# Patient Record
Sex: Female | Born: 1990 | Race: Black or African American | Hispanic: No | Marital: Single | Smoking: Former smoker
Health system: Southern US, Community
[De-identification: ages and names within clinical notes are randomized; demographics above are authoritative.]

## PROBLEM LIST (undated history)

## (undated) ENCOUNTER — Inpatient Hospital Stay (HOSPITAL_COMMUNITY): Payer: Self-pay

## (undated) DIAGNOSIS — F32A Depression, unspecified: Secondary | ICD-10-CM

## (undated) DIAGNOSIS — F319 Bipolar disorder, unspecified: Secondary | ICD-10-CM

## (undated) DIAGNOSIS — E282 Polycystic ovarian syndrome: Secondary | ICD-10-CM

## (undated) DIAGNOSIS — A549 Gonococcal infection, unspecified: Secondary | ICD-10-CM

## (undated) DIAGNOSIS — F4310 Post-traumatic stress disorder, unspecified: Secondary | ICD-10-CM

## (undated) DIAGNOSIS — A599 Trichomoniasis, unspecified: Secondary | ICD-10-CM

## (undated) DIAGNOSIS — I10 Essential (primary) hypertension: Secondary | ICD-10-CM

## (undated) DIAGNOSIS — F329 Major depressive disorder, single episode, unspecified: Secondary | ICD-10-CM

## (undated) HISTORY — PX: WISDOM TOOTH EXTRACTION: SHX21

## (undated) HISTORY — PX: EAR EXAMINATION UNDER ANESTHESIA: SHX1482

## (undated) HISTORY — DX: Essential (primary) hypertension: I10

---
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
## 2012-02-13 ENCOUNTER — Encounter (HOSPITAL_COMMUNITY): Payer: Self-pay | Admitting: *Deleted

## 2012-02-13 ENCOUNTER — Emergency Department (HOSPITAL_COMMUNITY)
Admission: EM | Admit: 2012-02-13 | Discharge: 2012-02-13 | Disposition: A | Discharge status: Home Health | Payer: Medicaid Other | Attending: Emergency Medicine | Admitting: Emergency Medicine

## 2012-02-13 DIAGNOSIS — R110 Nausea: Secondary | ICD-10-CM | POA: Insufficient documentation

## 2012-02-13 DIAGNOSIS — R102 Pelvic and perineal pain: Secondary | ICD-10-CM

## 2012-02-13 DIAGNOSIS — Z711 Person with feared health complaint in whom no diagnosis is made: Secondary | ICD-10-CM

## 2012-02-13 DIAGNOSIS — F17200 Nicotine dependence, unspecified, uncomplicated: Secondary | ICD-10-CM | POA: Insufficient documentation

## 2012-02-13 DIAGNOSIS — N949 Unspecified condition associated with female genital organs and menstrual cycle: Secondary | ICD-10-CM | POA: Insufficient documentation

## 2012-02-13 DIAGNOSIS — R350 Frequency of micturition: Secondary | ICD-10-CM | POA: Insufficient documentation

## 2012-02-13 DIAGNOSIS — R630 Anorexia: Secondary | ICD-10-CM | POA: Insufficient documentation

## 2012-02-13 DIAGNOSIS — Z8739 Personal history of other diseases of the musculoskeletal system and connective tissue: Secondary | ICD-10-CM | POA: Insufficient documentation

## 2012-02-13 DIAGNOSIS — R300 Dysuria: Secondary | ICD-10-CM | POA: Insufficient documentation

## 2012-02-13 DIAGNOSIS — G43909 Migraine, unspecified, not intractable, without status migrainosus: Secondary | ICD-10-CM | POA: Insufficient documentation

## 2012-02-13 LAB — URINALYSIS, ROUTINE W REFLEX MICROSCOPIC
Glucose, UA: NEGATIVE mg/dL
Ketones, ur: 15 mg/dL — AB
Leukocytes, UA: NEGATIVE
Protein, ur: NEGATIVE mg/dL
Urobilinogen, UA: 1 mg/dL (ref 0.0–1.0)

## 2012-02-13 LAB — COMPREHENSIVE METABOLIC PANEL
AST: 12 U/L (ref 0–37)
Albumin: 4 g/dL (ref 3.5–5.2)
BUN: 12 mg/dL (ref 6–23)
Creatinine, Ser: 0.9 mg/dL (ref 0.50–1.10)
Total Protein: 7.3 g/dL (ref 6.0–8.3)

## 2012-02-13 LAB — WET PREP, GENITAL
Trich, Wet Prep: NONE SEEN
Yeast Wet Prep HPF POC: NONE SEEN

## 2012-02-13 LAB — PREGNANCY, URINE: Preg Test, Ur: NEGATIVE

## 2012-02-13 LAB — CBC WITH DIFFERENTIAL/PLATELET
Basophils Absolute: 0 10*3/uL (ref 0.0–0.1)
Basophils Relative: 0 % (ref 0–1)
Eosinophils Absolute: 0.2 10*3/uL (ref 0.0–0.7)
HCT: 41.9 % (ref 36.0–46.0)
Hemoglobin: 14 g/dL (ref 12.0–15.0)
MCH: 28.9 pg (ref 26.0–34.0)
MCHC: 33.4 g/dL (ref 30.0–36.0)
Monocytes Absolute: 0.5 10*3/uL (ref 0.1–1.0)
Monocytes Relative: 7 % (ref 3–12)
Neutro Abs: 4.5 10*3/uL (ref 1.7–7.7)
Neutrophils Relative %: 58 % (ref 43–77)
RDW: 13.9 % (ref 11.5–15.5)

## 2012-02-13 LAB — LIPASE, BLOOD: Lipase: 21 U/L (ref 11–59)

## 2012-02-13 MED ORDER — DEXAMETHASONE SODIUM PHOSPHATE 10 MG/ML IJ SOLN
10.0000 mg | Freq: Once | INTRAMUSCULAR | Status: AC
  Administered 2012-02-13: 10 mg via INTRAVENOUS
  Filled 2012-02-13: qty 1

## 2012-02-13 MED ORDER — METOCLOPRAMIDE HCL 5 MG/ML IJ SOLN
10.0000 mg | Freq: Once | INTRAMUSCULAR | Status: AC
  Administered 2012-02-13: 10 mg via INTRAVENOUS
  Filled 2012-02-13: qty 2

## 2012-02-13 MED ORDER — AZITHROMYCIN 250 MG PO TABS
1000.0000 mg | ORAL_TABLET | Freq: Once | ORAL | Status: AC
  Administered 2012-02-13: 1000 mg via ORAL
  Filled 2012-02-13: qty 4

## 2012-02-13 MED ORDER — CEFTRIAXONE SODIUM 250 MG IJ SOLR
250.0000 mg | Freq: Once | INTRAMUSCULAR | Status: AC
  Administered 2012-02-13: 250 mg via INTRAMUSCULAR
  Filled 2012-02-13: qty 250

## 2012-02-13 MED ORDER — DIPHENHYDRAMINE HCL 50 MG/ML IJ SOLN
25.0000 mg | Freq: Once | INTRAMUSCULAR | Status: AC
  Administered 2012-02-13: 25 mg via INTRAVENOUS
  Filled 2012-02-13: qty 1

## 2012-02-13 NOTE — ED Provider Notes (Addendum)
History     CSN: [id number]  Arrival date & time 02/13/12  1525   First MD Initiated Contact with Patient 02/13/12 2033      Chief Complaint  Patient presents with  . multiple complaints     (Consider location/radiation/quality/duration/timing/severity/associated sxs/prior treatment) Patient is a 21 y.o. female presenting with headaches and abdominal pain. The history is provided by the patient.  Headache  This is a recurrent problem. The current episode started 12 to 24 hours ago. The problem occurs constantly. The problem has not changed since onset.The headache is associated with loud noise. The pain is located in the left unilateral region. The quality of the pain is described as throbbing. The pain is at a severity of 7/10. The pain is moderate. The pain does not radiate. Associated symptoms include anorexia and nausea. Pertinent negatives include no fever, no palpitations, no shortness of breath and no vomiting. She has tried acetaminophen for the symptoms. The treatment provided no relief.  Abdominal Pain The primary symptoms of the illness include abdominal pain, nausea and dysuria. The primary symptoms of the illness do not include fever, shortness of breath, vomiting or vaginal discharge. The current episode started yesterday. The onset of the illness was gradual. The problem has been gradually worsening.  The abdominal pain began yesterday. The pain came on gradually. The abdominal pain has been gradually worsening since its onset. The abdominal pain is located in the suprapubic region. The abdominal pain radiates to the back. The severity of the abdominal pain is 4/10. The abdominal pain is relieved by nothing. The abdominal pain is exacerbated by urination.  The dysuria is associated with frequency.  Additional symptoms associated with the illness include anorexia and frequency. Associated medical issues comments: her partner told her today that he had something and needed to be  checked.  she is sexually acitve with one partner and does not use protection.    Past Medical History  Diagnosis Date  . Polycystic ovary disease     Past Surgical History  Procedure Date  . Ear examination under anesthesia     No family history on file.  History  Substance Use Topics  . Smoking status: Current Every Day Smoker  . Smokeless tobacco: Never Used  . Alcohol Use: No    OB History    Grav Para Term Preterm Abortions TAB SAB Ect Mult Living                  Review of Systems  Constitutional: Negative for fever.  Respiratory: Negative for shortness of breath.   Cardiovascular: Negative for palpitations.  Gastrointestinal: Positive for nausea, abdominal pain and anorexia. Negative for vomiting.  Genitourinary: Positive for dysuria and frequency. Negative for vaginal discharge.  Neurological: Positive for headaches.  All other systems reviewed and are negative.    Allergies  Penicillins  Home Medications  No current outpatient prescriptions on file.  BP 123/58  Pulse 72  Temp 98.1 F (36.7 C) (Oral)  Resp 18  SpO2 99%  LMP 02/08/2012  Physical Exam  Nursing note and vitals reviewed. Constitutional: She is oriented to person, place, and time. She appears well-developed and well-nourished. No distress.  HENT:  Head: Normocephalic and atraumatic.  Eyes: EOM are normal. Pupils are equal, round, and reactive to light.  Cardiovascular: Normal rate, regular rhythm, normal heart sounds and intact distal pulses.  Exam reveals no friction rub.   No murmur heard. Pulmonary/Chest: Effort normal and breath sounds normal. She has no  wheezes. She has no rales.  Abdominal: Soft. Bowel sounds are normal. She exhibits no distension. There is tenderness in the suprapubic area. There is no rebound, no guarding and no CVA tenderness.  Genitourinary: Vagina normal. Uterus is tender. Cervix exhibits motion tenderness and discharge. Cervix exhibits no friability. Right  adnexum displays tenderness. Right adnexum displays no mass and no fullness. Left adnexum displays tenderness. Left adnexum displays no mass and no fullness.  Musculoskeletal: Normal range of motion. She exhibits no tenderness.       No edema  Neurological: She is alert and oriented to person, place, and time. She has normal strength. No cranial nerve deficit or sensory deficit. Gait normal.  Skin: Skin is warm and dry. No rash noted.  Psychiatric: She has a normal mood and affect. Her behavior is normal.    ED Course  Procedures (including critical care time)  Labs Reviewed  URINALYSIS, ROUTINE W REFLEX MICROSCOPIC - Abnormal; Notable for the following:    APPearance CLOUDY (*)     Specific Gravity, Urine 1.036 (*)     Bilirubin Urine SMALL (*)     Ketones, ur 15 (*)     All other components within normal limits  WET PREP, GENITAL - Abnormal; Notable for the following:    Clue Cells Wet Prep HPF POC FEW (*)     WBC, Wet Prep HPF POC FEW (*)     All other components within normal limits  PREGNANCY, URINE  CBC WITH DIFFERENTIAL  COMPREHENSIVE METABOLIC PANEL  LIPASE, BLOOD  GC/CHLAMYDIA PROBE AMP   No results found.   1. Migraine   2. Pelvic pain   3. Concern about STD in female without diagnosis       MDM   Pt with typical migraine HA without sx suggestive of SAH(sudden onset, worst of life, or deficits), infection, or cavernous vein thrombosis.  Normal neuro exam and vital signs. Will give HA cocktail and on re-eval.  Feel much better with relieved HA.  Secondly pt is c/o urinary frequency and pelvic pain. On exam she has diffuse pelvic tenderness but no focal findings concerning for tubo-ovarian abscess. She does have discharge or wet prep with few white blood cells. However her partner stated he had something and she needed to be checked.  She was given a dose of Rocephin and azithromycin. GC and Chlamydia cultures were sent.  Patient was discharged  home.        Gwyneth Sprout, MD 02/13/12 1610  Gwyneth Sprout, MD 02/13/12 2243

## 2012-02-13 NOTE — ED Notes (Signed)
The pt has had a headache  Diarrhea  Lower back pain that radiates around to her lower abd and painful urination  And frequency for 2 weeks.  lmp  Nov 30th

## 2012-02-14 LAB — GC/CHLAMYDIA PROBE AMP: CT Probe RNA: POSITIVE — AB

## 2012-02-15 ENCOUNTER — Telehealth (HOSPITAL_COMMUNITY): Payer: Self-pay | Admitting: Emergency Medicine

## 2012-02-15 NOTE — ED Notes (Signed)
+  Chlamydia. Patient treated with Rocephin and Zithromax. Per protocol MD. [facility] faxed. °

## 2012-03-11 NOTE — L&D Delivery Note (Signed)
Delivery Note At 3:55 AM a viable female was delivered via  (Presentation: ROA ), compound presentation with posterior arm.   Placenta status: delivered with cord traction.  Cord pH: sent  Anesthesia:  None Episiotomy: None Lacerations: None Suture Repair: N/A Est. Blood Loss (mL): 100 ml  Mom to postpartum.  Baby to NICU.  JACKSON-MOORE,Marjo Grosvenor A 10/15/2012, 4:07 AM

## 2012-04-18 ENCOUNTER — Emergency Department (HOSPITAL_COMMUNITY)
Admission: EM | Admit: 2012-04-18 | Discharge: 2012-04-18 | Disposition: A | Discharge status: Home / Self Care | Payer: Medicaid Other | Attending: Emergency Medicine | Admitting: Emergency Medicine

## 2012-04-18 ENCOUNTER — Emergency Department (HOSPITAL_COMMUNITY): Payer: Medicaid Other

## 2012-04-18 ENCOUNTER — Encounter (HOSPITAL_COMMUNITY): Payer: Self-pay | Admitting: *Deleted

## 2012-04-18 DIAGNOSIS — Z3490 Encounter for supervision of normal pregnancy, unspecified, unspecified trimester: Secondary | ICD-10-CM

## 2012-04-18 DIAGNOSIS — Z3201 Encounter for pregnancy test, result positive: Secondary | ICD-10-CM | POA: Insufficient documentation

## 2012-04-18 DIAGNOSIS — Z8639 Personal history of other endocrine, nutritional and metabolic disease: Secondary | ICD-10-CM | POA: Insufficient documentation

## 2012-04-18 DIAGNOSIS — A599 Trichomoniasis, unspecified: Secondary | ICD-10-CM

## 2012-04-18 DIAGNOSIS — A5901 Trichomonal vulvovaginitis: Secondary | ICD-10-CM | POA: Insufficient documentation

## 2012-04-18 DIAGNOSIS — Z862 Personal history of diseases of the blood and blood-forming organs and certain disorders involving the immune mechanism: Secondary | ICD-10-CM | POA: Insufficient documentation

## 2012-04-18 DIAGNOSIS — O99330 Smoking (tobacco) complicating pregnancy, unspecified trimester: Secondary | ICD-10-CM | POA: Insufficient documentation

## 2012-04-18 DIAGNOSIS — O2390 Unspecified genitourinary tract infection in pregnancy, unspecified trimester: Secondary | ICD-10-CM | POA: Insufficient documentation

## 2012-04-18 LAB — URINALYSIS, ROUTINE W REFLEX MICROSCOPIC
Bilirubin Urine: NEGATIVE
Glucose, UA: NEGATIVE mg/dL
Hgb urine dipstick: NEGATIVE
Ketones, ur: NEGATIVE mg/dL
Protein, ur: NEGATIVE mg/dL

## 2012-04-18 LAB — WET PREP, GENITAL: Yeast Wet Prep HPF POC: NONE SEEN

## 2012-04-18 LAB — URINE MICROSCOPIC-ADD ON

## 2012-04-18 LAB — POCT PREGNANCY, URINE: Preg Test, Ur: POSITIVE — AB

## 2012-04-18 MED ORDER — LIDOCAINE HCL (PF) 1 % IJ SOLN
INTRAMUSCULAR | Status: AC
  Filled 2012-04-18: qty 5

## 2012-04-18 MED ORDER — ONDANSETRON 4 MG PO TBDP
8.0000 mg | ORAL_TABLET | Freq: Once | ORAL | Status: AC
  Administered 2012-04-18: 8 mg via ORAL
  Filled 2012-04-18: qty 2

## 2012-04-18 MED ORDER — CEFTRIAXONE SODIUM 250 MG IJ SOLR
250.0000 mg | Freq: Once | INTRAMUSCULAR | Status: AC
  Administered 2012-04-18: 250 mg via INTRAMUSCULAR
  Filled 2012-04-18: qty 250

## 2012-04-18 MED ORDER — ONDANSETRON HCL 4 MG PO TABS: 4.0000 mg | ORAL_TABLET | Freq: Four times a day (QID) | ORAL | Status: DC

## 2012-04-18 MED ORDER — ONDANSETRON 4 MG PO TBDP: 8.0000 mg | ORAL_TABLET | Freq: Once | ORAL | Status: DC

## 2012-04-18 MED ORDER — METRONIDAZOLE 500 MG PO TABS
2000.0000 mg | ORAL_TABLET | Freq: Once | ORAL | Status: AC
  Administered 2012-04-18: 2000 mg via ORAL
  Filled 2012-04-18: qty 4

## 2012-04-18 MED ORDER — ACETAMINOPHEN 325 MG PO TABS
650.0000 mg | ORAL_TABLET | Freq: Once | ORAL | Status: AC
  Administered 2012-04-18: 650 mg via ORAL
  Filled 2012-04-18: qty 2

## 2012-04-18 NOTE — ED Notes (Signed)
Pt requesting food and tylenol for a headache.

## 2012-04-18 NOTE — ED Provider Notes (Signed)
History     CSN: [id number]  Arrival date & time 04/18/12  1016   First MD Initiated Contact with Patient 04/18/12 1055      Chief Complaint  Patient presents with  . Abdominal Pain    (Consider location/radiation/quality/duration/timing/severity/associated sxs/prior treatment) Patient is a 22 y.o. female presenting with abdominal pain. The history is provided by the patient. No language interpreter was used.  Abdominal Pain Pain location:  Suprapubic Pain quality: dull and pressure   Pain quality: not stabbing   Pain radiates to:  Does not radiate Pain severity:  Moderate Duration:  2 days Progression:  Unchanged Chronicity:  New Context: not alcohol use, not laxative use, not recent illness and not sick contacts   Relieved by:  None tried Worsened by:  Palpation Ineffective treatments:  None tried Associated symptoms: nausea   Associated symptoms: no diarrhea, no dysuria, no fever, no hematuria, no shortness of breath, no vaginal bleeding, no vaginal discharge and no vomiting     Past Medical History  Diagnosis Date  . Polycystic ovary disease     Past Surgical History  Procedure Laterality Date  . Ear examination under anesthesia      History reviewed. No pertinent family history.  History  Substance Use Topics  . Smoking status: Current Every Day Smoker  . Smokeless tobacco: Never Used  . Alcohol Use: No    OB History   Grav Para Term Preterm Abortions TAB SAB Ect Mult Living                  Review of Systems  Constitutional: Negative.  Negative for fever.  HENT: Negative.   Eyes: Negative.   Respiratory: Negative.  Negative for shortness of breath.   Cardiovascular: Negative.   Gastrointestinal: Positive for nausea and abdominal pain. Negative for vomiting, diarrhea, blood in stool and abdominal distention.  Genitourinary: Negative for dysuria, urgency, frequency, hematuria, flank pain, vaginal bleeding, vaginal discharge, difficulty urinating and  genital sores.  Neurological: Negative.   Psychiatric/Behavioral: Negative.   All other systems reviewed and are negative.    Allergies  Penicillins  Home Medications  No current outpatient prescriptions on file.  BP 120/68  Pulse 75  Temp(Src) 98.5 F (36.9 C) (Oral)  Resp 18  SpO2 100%  LMP 04/10/2012  Physical Exam  Nursing note and vitals reviewed. Constitutional: She is oriented to person, place, and time. She appears well-developed and well-nourished.  HENT:  Head: Normocephalic and atraumatic.  Eyes: Conjunctivae and EOM are normal. Pupils are equal, round, and reactive to light.  Neck: Normal range of motion. Neck supple.  Cardiovascular: Normal rate.   Pulmonary/Chest: Effort normal.  Abdominal: Soft. Bowel sounds are normal. She exhibits no distension. There is tenderness. There is no rebound and no guarding.  Genitourinary: Vaginal discharge found.  Musculoskeletal: Normal range of motion. She exhibits no edema and no tenderness.  Neurological: She is alert and oriented to person, place, and time. She has normal reflexes.  Skin: Skin is warm and dry.  Psychiatric: She has a normal mood and affect.    ED Course  Pelvic exam Date/Time: 04/18/2012 11:25 AM Performed by: Remi Haggard Authorized by: Remi Haggard Consent: Verbal consent obtained. Risks and benefits: risks, benefits and alternatives were discussed Consent given by: patient Patient understanding: patient states understanding of the procedure being performed Patient identity confirmed: verbally with patient, arm band, provided demographic data and hospital-assigned identification number Time out: Immediately prior to procedure a "time out" was called  to verify the correct patient, procedure, equipment, support staff and site/side marked as required. Preparation: Patient was prepped and draped in the usual sterile fashion. Patient sedated: no Patient tolerance: Patient tolerated the procedure  well with no immediate complications. Comments: Foul discharge   (including critical care time) 1400. [facility]  Will give flagyl for thichamonias. Labs Reviewed  URINALYSIS, ROUTINE W REFLEX MICROSCOPIC - Abnormal; Notable for the following:    Leukocytes, UA TRACE (*)    All other components within normal limits  URINE MICROSCOPIC-ADD ON - Abnormal; Notable for the following:    Squamous Epithelial / LPF FEW (*)    Crystals CA OXALATE CRYSTALS (*)    All other components within normal limits  POCT PREGNANCY, URINE - Abnormal; Notable for the following:    Preg Test, Ur POSITIVE (*)    All other components within normal limits  GC/CHLAMYDIA PROBE AMP  WET PREP, GENITAL   No results found.   No diagnosis found.    MDM  Pregnancy with trich.  Rocephen IM and Flagyl in ER. She will follow up with Dr. Gaynell Face this week.  Repeat U/s in 10 day.  She will also [facility] her [facility] and [facility].  U/s shows 5 weeks and 1 day.  Shared visit with Dr. Rhunette Croft.     Labs Reviewed  WET PREP, GENITAL - Abnormal; Notable for the following:    Trich, Wet Prep MODERATE (*)    Clue Cells Wet Prep HPF POC MODERATE (*)    WBC, Wet Prep HPF POC FEW (*)    All other components within normal limits  URINALYSIS, ROUTINE W REFLEX MICROSCOPIC - Abnormal; Notable for the following:    Leukocytes, UA TRACE (*)    All other components within normal limits  URINE MICROSCOPIC-ADD ON - Abnormal; Notable for the following:    Squamous Epithelial / LPF FEW (*)    Crystals CA OXALATE CRYSTALS (*)    All other components within normal limits  HCG, QUANTITATIVE, PREGNANCY - Abnormal; Notable for the following:    hCG, Beta Chain, Quant, S 1438 (*)    All other components within normal limits  POCT PREGNANCY, URINE - Abnormal; Notable for the following:    Preg Test, Ur POSITIVE (*)    All other components within normal limits  GC/CHLAMYDIA PROBE  AMP          Remi Haggard, NP 04/18/12 1831

## 2012-04-18 NOTE — ED Notes (Signed)
To ED for eval of lower abd pain for that past cple of days. Pt states she is a week overdue for menses. Feels like she has a pressing sensation in lower abd. Denies vaginal discharge or urinary difficulty

## 2012-04-19 ENCOUNTER — Telehealth (HOSPITAL_COMMUNITY): Payer: Self-pay | Admitting: Emergency Medicine

## 2012-04-19 NOTE — ED Provider Notes (Signed)
Medical screening examination/treatment/procedure(s) were conducted as a shared visit with non-physician practitioner(s) and myself.  I personally evaluated the patient during the encounter.  Pt should have received 1 gram of Azithromycin as well. I called the flow management - and they have taken the verbal order on my behalf, will call the patient and call in the script on my behalf. 1 gram azithromycin po x 1.  Derwood Kaplan, MD 04/19/12 [phone]

## 2012-04-19 NOTE — ED Notes (Signed)
Dr Rhunette Croft called flow manager requesting that we call contact patient and then call in Azithromycin, one gram PO x one dose. Pt contacted ID verified x two. RX called to [facility] [phone] voicemail.

## 2012-04-21 LAB — GC/CHLAMYDIA PROBE AMP
CT Probe RNA: NEGATIVE
GC Probe RNA: NEGATIVE

## 2012-04-23 ENCOUNTER — Emergency Department (HOSPITAL_COMMUNITY): Payer: Medicaid Other

## 2012-04-23 ENCOUNTER — Emergency Department (HOSPITAL_COMMUNITY)
Admission: EM | Admit: 2012-04-23 | Discharge: 2012-04-24 | Disposition: A | Discharge status: Home / Self Care | Payer: Medicaid Other | Attending: Emergency Medicine | Admitting: Emergency Medicine

## 2012-04-23 ENCOUNTER — Encounter (HOSPITAL_COMMUNITY): Payer: Self-pay | Admitting: *Deleted

## 2012-04-23 DIAGNOSIS — N898 Other specified noninflammatory disorders of vagina: Secondary | ICD-10-CM | POA: Insufficient documentation

## 2012-04-23 DIAGNOSIS — F17200 Nicotine dependence, unspecified, uncomplicated: Secondary | ICD-10-CM | POA: Insufficient documentation

## 2012-04-23 DIAGNOSIS — R110 Nausea: Secondary | ICD-10-CM | POA: Insufficient documentation

## 2012-04-23 DIAGNOSIS — O9989 Other specified diseases and conditions complicating pregnancy, childbirth and the puerperium: Secondary | ICD-10-CM | POA: Insufficient documentation

## 2012-04-23 DIAGNOSIS — R109 Unspecified abdominal pain: Secondary | ICD-10-CM

## 2012-04-23 DIAGNOSIS — Z8742 Personal history of other diseases of the female genital tract: Secondary | ICD-10-CM | POA: Insufficient documentation

## 2012-04-23 LAB — CBC WITH DIFFERENTIAL/PLATELET
Hemoglobin: 13.7 g/dL (ref 12.0–15.0)
Lymphocytes Relative: 28 % (ref 12–46)
Lymphs Abs: 2.6 10*3/uL (ref 0.7–4.0)
Monocytes Relative: 6 % (ref 3–12)
Neutro Abs: 5.9 10*3/uL (ref 1.7–7.7)
Neutrophils Relative %: 63 % (ref 43–77)
Platelets: 216 10*3/uL (ref 150–400)
RBC: 4.68 MIL/uL (ref 3.87–5.11)
WBC: 9.3 10*3/uL (ref 4.0–10.5)

## 2012-04-23 LAB — URINALYSIS, ROUTINE W REFLEX MICROSCOPIC
Hgb urine dipstick: NEGATIVE
Nitrite: NEGATIVE
Specific Gravity, Urine: 1.027 (ref 1.005–1.030)
Urobilinogen, UA: 0.2 mg/dL (ref 0.0–1.0)
pH: 6 (ref 5.0–8.0)

## 2012-04-23 LAB — WET PREP, GENITAL: Trich, Wet Prep: NONE SEEN

## 2012-04-23 LAB — COMPREHENSIVE METABOLIC PANEL
ALT: 10 U/L (ref 0–35)
Alkaline Phosphatase: 46 U/L (ref 39–117)
CO2: 24 mEq/L (ref 19–32)
Chloride: 104 mEq/L (ref 96–112)
GFR calc Af Amer: >90 mL/min (ref >90)
GFR calc non Af Amer: >90 mL/min (ref >90)
Glucose, Bld: 91 mg/dL (ref 70–99)
Potassium: 3.6 mEq/L (ref 3.5–5.1)
Sodium: 138 mEq/L (ref 135–145)
Total Bilirubin: 0.3 mg/dL (ref 0.3–1.2)
Total Protein: 6.8 g/dL (ref 6.0–8.3)

## 2012-04-23 MED ORDER — SODIUM CHLORIDE 0.9 % IV BOLUS (SEPSIS)
1000.0000 mL | Freq: Once | INTRAVENOUS | Status: AC
  Administered 2012-04-23: 1000 mL via INTRAVENOUS

## 2012-04-23 NOTE — ED Provider Notes (Signed)
I have supervised the resident on the management of this patient and agree with the note above. I personally interviewed and examined the patient and my addendum is below.   Candace Richardson is a 22 y.o. female [redacted] weeks pregnant, here with lower ab pain, vaginal discharge. Diagnosed with trichomonas last week, treated with flagyl. Transvag US showed possible gestaional sac vs pseudo sac of ectopic pregnancy. Cramps since yesterday and vaginal discharge. Will repeat labs, UA, transvag US.   11:47 PM US showed IUP. HCG increased appropriately. Will d/c home with OB f/u.    Results for orders placed during the hospital encounter of 04/23/12  WET PREP, GENITAL      Result Value Range   Yeast Wet Prep HPF POC NONE SEEN  NONE SEEN   Trich, Wet Prep NONE SEEN  NONE SEEN   Clue Cells Wet Prep HPF POC FEW (*) NONE SEEN   WBC, Wet Prep HPF POC MODERATE (*) NONE SEEN  HCG, QUANTITATIVE, PREGNANCY      Result Value Range   hCG, Beta Chain, Quant, S 7829 (*) <5 mIU/mL  CBC WITH DIFFERENTIAL      Result Value Range   WBC 9.3  4.0 - 10.5 K/uL   RBC 4.68  3.87 - 5.11 MIL/uL   Hemoglobin 13.7  12.0 - 15.0 g/dL   HCT 16.1  09.6 - 04.5 %   MCV 84.2  78.0 - 100.0 fL   MCH 29.3  26.0 - 34.0 pg   MCHC 34.8  30.0 - 36.0 g/dL   RDW 40.9  81.1 - 91.4 %   Platelets 216  150 - 400 K/uL   Neutrophils Relative 63  43 - 77 %   Neutro Abs 5.9  1.7 - 7.7 K/uL   Lymphocytes Relative 28  12 - 46 %   Lymphs Abs 2.6  0.7 - 4.0 K/uL   Monocytes Relative 6  3 - 12 %   Monocytes Absolute 0.6  0.1 - 1.0 K/uL   Eosinophils Relative 3  0 - 5 %   Eosinophils Absolute 0.2  0.0 - 0.7 K/uL   Basophils Relative 0  0 - 1 %   Basophils Absolute 0.0  0.0 - 0.1 K/uL  COMPREHENSIVE METABOLIC PANEL      Result Value Range   Sodium 138  135 - 145 mEq/L   Potassium 3.6  3.5 - 5.1 mEq/L   Chloride 104  96 - 112 mEq/L   CO2 24  19 - 32 mEq/L   Glucose, Bld 91  70 - 99 mg/dL   BUN 9  6 - 23 mg/dL   Creatinine, Ser 7.82  0.50 -  1.10 mg/dL   Calcium 9.0  8.4 - 95.6 mg/dL   Total Protein 6.8  6.0 - 8.3 g/dL   Albumin 3.9  3.5 - 5.2 g/dL   AST 14  0 - 37 U/L   ALT 10  0 - 35 U/L   Alkaline Phosphatase 46  39 - 117 U/L   Total Bilirubin 0.3  0.3 - 1.2 mg/dL   GFR calc non Af Amer >90  >90 mL/min   GFR calc Af Amer >90  >90 mL/min   US Ob Comp Less 14 Wks  04/23/2012  *RADIOLOGY REPORT*  Clinical Data: Lower abdominal pain.  Early pregnancy.  OBSTETRIC <14 WK [place] AND TRANSVAGINAL OB US  Technique: Both transabdominal and transvaginal ultrasound examinations were performed for complete evaluation of the gestation as well as the maternal  uterus, adnexal regions, and pelvic cul-de-sac.  Comparison:  04/18/2012  Findings: Single intrauterine gestational sac noted with yolk sac visible.  Embryo not yet well seen.  Mean sac diameter 8 mm compatible with 5 weeks 4 days gestation.  This is congruent with gestational age by first ultrasound and last menstrual period.  No subchorionic hemorrhage.  Right ovarian corpus luteum unremarkable.  Left ovary not well seen.  Trace free pelvic fluid noted.  IMPRESSION:  1.  Intrauterine gestational sac measuring at 5 weeks 4 days gestation, with yolk sac visible but the embryo not yet well seen. This is probably due to early phase of pregnancy.  Correlation with quantitative beta HCG trend suggested. 2.  Trace amount of free pelvic fluid. 3.  The left ovary was not well seen on today's exam.   Original Report Authenticated By: Gaylyn Rong, M.D.    US Ob Comp Less 14 Wks  04/18/2012  *RADIOLOGY REPORT*  Clinical Data: Abdominal pain, estimated gestational age by last menstrual period equals 5 weeks 1 day  OBSTETRIC <14 WK [place] AND TRANSVAGINAL OB US  Technique:  Both transabdominal and transvaginal ultrasound examinations were performed for complete evaluation of the gestation as well as the maternal uterus, adnexal regions, and pelvic cul-de-sac.  Transvaginal technique was performed to assess  early pregnancy.  Comparison:  None.  Intrauterine gestational sac:  There is a single eccentric sac within the uterine fundus along the endometrium suggestive of a gestational sac. Yolk sac: Not identified Embryo: Not identified  MSD: 3.9 mm  5 w 1 d       [place] EDC: 12/18/2012  Maternal uterus/adnexae: There is irregular cystic lesion within the right ovary measuring 1.3 cm.  This could represent a hemorrhagic cyst or corpus luteal cyst.  Ectopic pregnancy is felt unlikely.  The left ovary is difficult to visualize.  There is a small amount of free fluid the posterior cul-de-sac.  IMPRESSION:  1.  Single sac like structure within the uterine fundus that highly likely represents a gestational sac.  Cannot exclude a pseudo sac of ectopic pregnancy. Recommend follow-up ultrasound in 10 days.  2.  Estimate gestational age by mean sac diameter equals 5 weeks 1 day. 3.  Small amount free fluid.   Original Report Authenticated By: Genevive Bi, M.D.    US Ob Transvaginal  04/23/2012  *RADIOLOGY REPORT*  Clinical Data: Lower abdominal pain.  Early pregnancy.  OBSTETRIC <14 WK [place] AND TRANSVAGINAL OB US  Technique: Both transabdominal and transvaginal ultrasound examinations were performed for complete evaluation of the gestation as well as the maternal uterus, adnexal regions, and pelvic cul-de-sac.  Comparison:  04/18/2012  Findings: Single intrauterine gestational sac noted with yolk sac visible.  Embryo not yet well seen.  Mean sac diameter 8 mm compatible with 5 weeks 4 days gestation.  This is congruent with gestational age by first ultrasound and last menstrual period.  No subchorionic hemorrhage.  Right ovarian corpus luteum unremarkable.  Left ovary not well seen.  Trace free pelvic fluid noted.  IMPRESSION:  1.  Intrauterine gestational sac measuring at 5 weeks 4 days gestation, with yolk sac visible but the embryo not yet well seen. This is probably due to early phase of pregnancy.  Correlation with  quantitative beta HCG trend suggested. 2.  Trace amount of free pelvic fluid. 3.  The left ovary was not well seen on today's exam.   Original Report Authenticated By: Gaylyn Rong, M.D.    US Ob Transvaginal  04/18/2012  *RADIOLOGY REPORT*  Clinical Data: Abdominal pain, estimated gestational age by last menstrual period equals 5 weeks 1 day  OBSTETRIC <14 WK [place] AND TRANSVAGINAL OB US  Technique:  Both transabdominal and transvaginal ultrasound examinations were performed for complete evaluation of the gestation as well as the maternal uterus, adnexal regions, and pelvic cul-de-sac.  Transvaginal technique was performed to assess early pregnancy.  Comparison:  None.  Intrauterine gestational sac:  There is a single eccentric sac within the uterine fundus along the endometrium suggestive of a gestational sac. Yolk sac: Not identified Embryo: Not identified  MSD: 3.9 mm  5 w 1 d       [place] EDC: 12/18/2012  Maternal uterus/adnexae: There is irregular cystic lesion within the right ovary measuring 1.3 cm.  This could represent a hemorrhagic cyst or corpus luteal cyst.  Ectopic pregnancy is felt unlikely.  The left ovary is difficult to visualize.  There is a small amount of free fluid the posterior cul-de-sac.  IMPRESSION:  1.  Single sac like structure within the uterine fundus that highly likely represents a gestational sac.  Cannot exclude a pseudo sac of ectopic pregnancy. Recommend follow-up ultrasound in 10 days.  2.  Estimate gestational age by mean sac diameter equals 5 weeks 1 day. 3.  Small amount free fluid.   Original Report Authenticated By: Genevive Bi, M.D.       Richardean Canal, MD 04/23/12 [phone]

## 2012-04-23 NOTE — ED Notes (Signed)
Pt states seen last weekend for abdominal pain and found out she was pregnant. Today pt states [redacted] weeks pregnant, with abdominal cramping.  Pt states yesterday she noticed brown discharge only when she wiped after using the bathroom.

## 2012-04-23 NOTE — ED Provider Notes (Signed)
History     CSN: [id number]  Arrival date & time 04/23/12  2042   First MD Initiated Contact with Patient 04/23/12 2101      Chief Complaint  Patient presents with  . Abdominal Pain    [redacted] weeks pregnant    (Consider location/radiation/quality/duration/timing/severity/associated sxs/prior treatment) Patient is a 22 y.o. female presenting with female genitourinary complaint. The history is provided by the patient. No language interpreter was used.  Female GU Problem Primary symptoms include discharge and vaginal bleeding.  Primary symptoms include no pelvic pain and no dysuria. Primary symptoms comment: low abdominal pain. There has been no fever. This is a new problem. The current episode started 2 days ago. The problem occurs constantly. The problem has not changed since onset.The symptoms occur after urination. She is pregnant. Her LMP was months ago. The discharge was brown. Associated symptoms include abdominal pain and nausea. Pertinent negatives include no anorexia, no diaphoresis, no constipation, no diarrhea, no vomiting, no frequency and no dizziness. She has tried nothing for the symptoms. The treatment provided no relief. Associated medical issues comments: trichomonas.    Past Medical History  Diagnosis Date  . Polycystic ovary disease     Past Surgical History  Procedure Laterality Date  . Ear examination under anesthesia      History reviewed. No pertinent family history.  History  Substance Use Topics  . Smoking status: Current Every Day Smoker  . Smokeless tobacco: Never Used  . Alcohol Use: No    OB History   Grav Para Term Preterm Abortions TAB SAB Ect Mult Living                  Review of Systems  Constitutional: Negative for fever, chills, diaphoresis, activity change, appetite change and fatigue.  HENT: Negative for congestion, sore throat, facial swelling, rhinorrhea, neck pain and neck stiffness.   Eyes: Negative for photophobia and discharge.   Respiratory: Negative for cough, chest tightness and shortness of breath.   Cardiovascular: Negative for chest pain, palpitations and leg swelling.  Gastrointestinal: Positive for nausea and abdominal pain. Negative for vomiting, diarrhea, constipation and anorexia.  Endocrine: Negative for polydipsia and polyuria.  Genitourinary: Positive for vaginal bleeding and vaginal discharge. Negative for dysuria, frequency, difficulty urinating and pelvic pain.  Musculoskeletal: Negative for back pain and arthralgias.  Skin: Negative for color change and wound.  Allergic/Immunologic: Negative for immunocompromised state.  Neurological: Negative for dizziness, facial asymmetry, weakness, numbness and headaches.  Hematological: Does not bruise/bleed easily.  Psychiatric/Behavioral: Negative for confusion and agitation.    Allergies  Penicillins  Home Medications  No current outpatient prescriptions on file.  BP 131/59  Pulse 81  Temp(Src) 98.4 F (36.9 C) (Oral)  Resp 16  SpO2 100%  LMP 04/10/2012  Physical Exam  Constitutional: She is oriented to person, place, and time. She appears well-developed and well-nourished. No distress.  HENT:  Head: Normocephalic and atraumatic.  Mouth/Throat: No oropharyngeal exudate.  Eyes: Pupils are equal, round, and reactive to light.  Neck: Normal range of motion. Neck supple.  Cardiovascular: Normal rate, regular rhythm and normal heart sounds.  Exam reveals no gallop and no friction rub.   No murmur heard. Pulmonary/Chest: Effort normal and breath sounds normal. No respiratory distress. She has no wheezes. She has no rales.  Abdominal: Soft. Bowel sounds are normal. She exhibits no distension and no mass. There is no tenderness. There is no rebound and no guarding.  Genitourinary: Vaginal discharge (thick white/brown discharge)  found.  Musculoskeletal: Normal range of motion. She exhibits no edema and no tenderness.  Neurological: She is alert and  oriented to person, place, and time.  Skin: Skin is warm and dry.  Psychiatric: She has a normal mood and affect.    ED Course  Procedures (including critical care time)  Labs Reviewed  WET PREP, GENITAL - Abnormal; Notable for the following:    Clue Cells Wet Prep HPF POC FEW (*)    WBC, Wet Prep HPF POC MODERATE (*)    All other components within normal limits  URINALYSIS, ROUTINE W REFLEX MICROSCOPIC - Abnormal; Notable for the following:    Ketones, ur >80 (*)    All other components within normal limits  HCG, QUANTITATIVE, PREGNANCY - Abnormal; Notable for the following:    hCG, Beta Chain, Quant, S 7829 (*)    All other components within normal limits  URINE CULTURE  CBC WITH DIFFERENTIAL  COMPREHENSIVE METABOLIC PANEL   US Ob Comp Less 14 Wks  04/23/2012  *RADIOLOGY REPORT*  Clinical Data: Lower abdominal pain.  Early pregnancy.  OBSTETRIC <14 WK [place] AND TRANSVAGINAL OB US  Technique: Both transabdominal and transvaginal ultrasound examinations were performed for complete evaluation of the gestation as well as the maternal uterus, adnexal regions, and pelvic cul-de-sac.  Comparison:  04/18/2012  Findings: Single intrauterine gestational sac noted with yolk sac visible.  Embryo not yet well seen.  Mean sac diameter 8 mm compatible with 5 weeks 4 days gestation.  This is congruent with gestational age by first ultrasound and last menstrual period.  No subchorionic hemorrhage.  Right ovarian corpus luteum unremarkable.  Left ovary not well seen.  Trace free pelvic fluid noted.  IMPRESSION:  1.  Intrauterine gestational sac measuring at 5 weeks 4 days gestation, with yolk sac visible but the embryo not yet well seen. This is probably due to early phase of pregnancy.  Correlation with quantitative beta HCG trend suggested. 2.  Trace amount of free pelvic fluid. 3.  The left ovary was not well seen on today's exam.   Original Report Authenticated By: Gaylyn Rong, M.D.    US Ob  Transvaginal  04/23/2012  *RADIOLOGY REPORT*  Clinical Data: Lower abdominal pain.  Early pregnancy.  OBSTETRIC <14 WK [place] AND TRANSVAGINAL OB US  Technique: Both transabdominal and transvaginal ultrasound examinations were performed for complete evaluation of the gestation as well as the maternal uterus, adnexal regions, and pelvic cul-de-sac.  Comparison:  04/18/2012  Findings: Single intrauterine gestational sac noted with yolk sac visible.  Embryo not yet well seen.  Mean sac diameter 8 mm compatible with 5 weeks 4 days gestation.  This is congruent with gestational age by first ultrasound and last menstrual period.  No subchorionic hemorrhage.  Right ovarian corpus luteum unremarkable.  Left ovary not well seen.  Trace free pelvic fluid noted.  IMPRESSION:  1.  Intrauterine gestational sac measuring at 5 weeks 4 days gestation, with yolk sac visible but the embryo not yet well seen. This is probably due to early phase of pregnancy.  Correlation with quantitative beta HCG trend suggested. 2.  Trace amount of free pelvic fluid. 3.  The left ovary was not well seen on today's exam.   Original Report Authenticated By: Gaylyn Rong, M.D.      1. Abdominal pain in pregnancy       MDM  12:11 AM Pt is a 22 y.o. female with pertinent PMHX of PCOC who presents with low abdominal  cramping since last night and dark brown vaginal d/c starting yesterday, seen only when she wipes.  No fever, chills, vomiting, dysuria, hematuria, frequency.  Pt seen 2/8 a/ ab cramping, had + preg, and transvaginal with likely IUP, but could not exclude a pseudo sac of ectopic pregnancy. Recommend follow-up ultrasound scheduled for next week.  On PE, VSS< pt in NAD. +low abdominal ttp.  Ddx includes UTI, ectopic pregnancy, threatened abortion.  Will plan on repeat TVUS, pelvic exam, UA   12:11 AM Pelvic w/ closed os, thick brown/white discharge. bHCG elevated from prior.   12:11 AM [place] w/ IUP.  Urine not infected.  Pt given  return precautions, can otherwise f/u with her OB as scheduled next week.   1. Abdominal pain in pregnancy      Labs and imaging considered in decision making, reviewed by myself.  Imaging interpreted by radiology. Pt care discussed with my attending, Dr. Silverio Lay.         Toy Cookey, MD 04/24/12 [phone]

## 2012-04-24 NOTE — ED Notes (Signed)
Pt dc to home.   Pt states understanding to dc instructions.  Pt ambulatory to exit without difficulty.  Pt denies need for w/c. 

## 2012-04-25 LAB — URINE CULTURE

## 2012-04-30 ENCOUNTER — Other Ambulatory Visit: Payer: Self-pay

## 2012-04-30 LAB — OB RESULTS CONSOLE HGB/HCT, BLOOD
HCT: 41 %
Hemoglobin: 13.5 g/dL

## 2012-04-30 LAB — OB RESULTS CONSOLE RPR: RPR: NONREACTIVE

## 2012-04-30 LAB — OB RESULTS CONSOLE ANTIBODY SCREEN: Antibody Screen: NEGATIVE

## 2012-04-30 LAB — OB RESULTS CONSOLE GC/CHLAMYDIA: Chlamydia: NEGATIVE

## 2012-04-30 LAB — OB RESULTS CONSOLE ABO/RH: RH Type: POSITIVE

## 2012-05-27 ENCOUNTER — Other Ambulatory Visit: Payer: Self-pay | Admitting: *Deleted

## 2012-05-27 ENCOUNTER — Encounter: Payer: Self-pay | Admitting: Obstetrics & Gynecology

## 2012-05-27 ENCOUNTER — Ambulatory Visit (INDEPENDENT_AMBULATORY_CARE_PROVIDER_SITE_OTHER): Payer: Medicaid Other | Admitting: Obstetrics & Gynecology

## 2012-05-27 VITALS — BP 110/65 | Temp 97.9°F | Wt 197.0 lb

## 2012-05-27 DIAGNOSIS — Z3401 Encounter for supervision of normal first pregnancy, first trimester: Secondary | ICD-10-CM

## 2012-05-27 DIAGNOSIS — Z3481 Encounter for supervision of other normal pregnancy, first trimester: Secondary | ICD-10-CM

## 2012-05-27 DIAGNOSIS — Z3400 Encounter for supervision of normal first pregnancy, unspecified trimester: Secondary | ICD-10-CM

## 2012-05-27 LAB — POCT URINALYSIS DIPSTICK
Bilirubin, UA: NEGATIVE
Blood, UA: NEGATIVE
Nitrite, UA: NEGATIVE
Protein, UA: NEGATIVE
pH, UA: 5

## 2012-05-27 NOTE — Progress Notes (Signed)
   Subjective:    Candace Richardson is a 22 y.o. female being seen today for her obstetrical visit. She is at [redacted]w[redacted]d gestation. Patient reports no complaints.   Menstrual History: OB History   Grav Para Term Preterm Abortions TAB SAB Ect Mult Living   1                Patient's last menstrual period was 03/14/2012.    The following portions of the patient's history were reviewed and updated as appropriate: past family history, past medical history, past social history and past surgical history.  Review of Systems Pertinent items are noted in HPI.   Objective:     BP 110/65  Temp(Src) 97.9 F (36.6 C)  Wt 197 lb (89.359 kg)  BMI 28.27 kg/m2  LMP 03/14/2012    Assessment:    Pregnancy 1 @ [redacted]w[redacted]d   Plan:    Problem list reviewed and updated. Labs reviewed. FIRST/CF testing requested  Follow up in 4 weeks. 75% of 10 minute visit spent on counseling and coordination of care.

## 2012-05-27 NOTE — Patient Instructions (Addendum)
Pregnancy - First Trimester During sexual intercourse, millions of sperm go into the vagina. Only 1 sperm will penetrate and fertilize the female egg while it is in the Fallopian tube. One week later, the fertilized egg implants into the wall of the uterus. An embryo begins to develop into a baby. At 6 to 8 weeks, the eyes and face are formed and the heartbeat can be seen on ultrasound. At the end of 12 weeks (first trimester), all the baby's organs are formed. Now that you are pregnant, you will want to do everything you can to have a healthy baby. Two of the most important things are to get good prenatal care and follow your caregiver's instructions. Prenatal care is all the medical care you receive before the baby's birth. It is given to prevent, find, and treat problems during the pregnancy and childbirth. PRENATAL EXAMS  During prenatal visits, your weight, blood pressure and urine are checked. This is done to make sure you are healthy and progressing normally during the pregnancy.  A pregnant woman should gain 25 to 35 pounds during the pregnancy. However, if you are over weight or underweight, your caregiver will advise you regarding your weight.  Your caregiver will ask and answer questions for you.  Blood work, cervical cultures, other necessary tests and a Pap test are done during your prenatal exams. These tests are done to check on your health and the probable health of your baby. Tests are strongly recommended and done for HIV with your permission. This is the virus that causes AIDS. These tests are done because medications can be given to help prevent your baby from being born with this infection should you have been infected without knowing it. Blood work is also used to find out your blood type, previous infections and follow your blood levels (hemoglobin).  Low hemoglobin (anemia) is common during pregnancy. Iron and vitamins are given to help prevent this. Later in the pregnancy, blood  tests for diabetes will be done along with any other tests if any problems develop. You may need tests to make sure you and the baby are doing well.  You may need other tests to make sure you and the baby are doing well. CHANGES DURING THE FIRST TRIMESTER (THE FIRST 3 MONTHS OF PREGNANCY) Your body goes through many changes during pregnancy. They vary from person to person. Talk to your caregiver about changes you notice and are concerned about. Changes can include:  Your menstrual period stops.  The egg and sperm carry the genes that determine what you look like. Genes from you and your partner are forming a baby. The female genes determine whether the baby is a boy or a girl.  Your body increases in girth and you may feel bloated.  Feeling sick to your stomach (nauseous) and throwing up (vomiting). If the vomiting is uncontrollable, call your caregiver.  Your breasts will begin to enlarge and become tender.  Your nipples may stick out more and become darker.  The need to urinate more. Painful urination may mean you have a bladder infection.  Tiring easily.  Loss of appetite.  Cravings for certain kinds of food.  At first, you may gain or lose a couple of pounds.  You may have changes in your emotions from day to day (excited to be pregnant or concerned something may go wrong with the pregnancy and baby).  You may have more vivid and strange dreams. HOME CARE INSTRUCTIONS   It is very important   to avoid all smoking, alcohol and un-prescribed drugs during your pregnancy. These affect the formation and growth of the baby. Avoid chemicals while pregnant to ensure the delivery of a healthy infant.  Start your prenatal visits by the 12th week of pregnancy. They are usually scheduled monthly at first, then more often in the last 2 months before delivery. Keep your caregiver's appointments. Follow your caregiver's instructions regarding medication use, blood and lab tests, exercise, and  diet.  During pregnancy, you are providing food for you and your baby. Eat regular, well-balanced meals. Choose foods such as meat, fish, milk and other low fat dairy products, vegetables, fruits, and whole-grain breads and cereals. Your caregiver will tell you of the ideal weight gain.  You can help morning sickness by keeping soda crackers at the bedside. Eat a couple before arising in the morning. You may want to use the crackers without salt on them.  Eating 4 to 5 small meals rather than 3 large meals a day also may help the nausea and vomiting.  Drinking liquids between meals instead of during meals also seems to help nausea and vomiting.  A physical sexual relationship may be continued throughout pregnancy if there are no other problems. Problems may be early (premature) leaking of amniotic fluid from the membranes, vaginal bleeding, or belly (abdominal) pain.  Exercise regularly if there are no restrictions. Check with your caregiver or physical therapist if you are unsure of the safety of some of your exercises. Greater weight gain will occur in the last 2 trimesters of pregnancy. Exercising will help:  Control your weight.  Keep you in shape.  Prepare you for labor and delivery.  Help you lose your pregnancy weight after you deliver your baby.  Wear a good support or jogging bra for breast tenderness during pregnancy. This may help if worn during sleep too.  Ask when prenatal classes are available. Begin classes when they are offered.  Do not use hot tubs, steam rooms or saunas.  Wear your seat belt when driving. This protects you and your baby if you are in an accident.  Avoid raw meat, uncooked cheese, cat litter boxes and soil used by cats throughout the pregnancy. These carry germs that can cause birth defects in the baby.  The first trimester is a good time to visit your dentist for your dental health. Getting your teeth cleaned is OK. Use a softer toothbrush and brush  gently during pregnancy.  Ask for help if you have financial, counseling or nutritional needs during pregnancy. Your caregiver will be able to offer counseling for these needs as well as refer you for other special needs.  Do not take any medications or herbs unless told by your caregiver.  Inform your caregiver if there is any mental or physical domestic violence.  Make a list of emergency phone numbers of family, friends, hospital, and police and fire departments.  Write down your questions. Take them to your prenatal visit.  Do not douche.  Do not cross your legs.  If you have to stand for long periods of time, rotate you feet or take small steps in a circle.  You may have more vaginal secretions that may require a sanitary pad. Do not use tampons or scented sanitary pads. MEDICATIONS AND DRUG USE IN PREGNANCY  Take prenatal vitamins as directed. The vitamin should contain 1 milligram of folic acid. Keep all vitamins out of reach of children. Only a couple vitamins or tablets containing iron may be   fatal to a baby or young child when ingested.  Avoid use of all medications, including herbs, over-the-counter medications, not prescribed or suggested by your caregiver. Only take over-the-counter or prescription medicines for pain, discomfort, or fever as directed by your caregiver. Do not use aspirin, ibuprofen, or naproxen unless directed by your caregiver.  Let your caregiver also know about herbs you may be using.  Alcohol is related to a number of birth defects. This includes fetal alcohol syndrome. All alcohol, in any form, should be avoided completely. Smoking will cause low birth rate and premature babies.  Street or illegal drugs are very harmful to the baby. They are absolutely forbidden. A baby born to an addicted mother will be addicted at birth. The baby will go through the same withdrawal an adult does.  Let your caregiver know about any medications that you have to take  and for what reason you take them. MISCARRIAGE IS COMMON DURING PREGNANCY A miscarriage does not mean you did something wrong. It is not a reason to worry about getting pregnant again. Your caregiver will help you with questions you may have. If you have a miscarriage, you may need minor surgery. SEEK MEDICAL CARE IF:  You have any concerns or worries during your pregnancy. It is better to call with your questions if you feel they cannot wait, rather than worry about them. SEEK IMMEDIATE MEDICAL CARE IF:   An unexplained oral temperature above 102 F (38.9 C) develops, or as your caregiver suggests.  You have leaking of fluid from the vagina (birth canal). If leaking membranes are suspected, take your temperature and inform your caregiver of this when you call.  There is vaginal spotting or bleeding. Notify your caregiver of the amount and how many pads are used.  You develop a bad smelling vaginal discharge with a change in the color.  You continue to feel sick to your stomach (nauseated) and have no relief from remedies suggested. You vomit blood or coffee ground-like materials.  You lose more than 2 pounds of weight in 1 week.  You gain more than 2 pounds of weight in 1 week and you notice swelling of your face, hands, feet, or legs.  You gain 5 pounds or more in 1 week (even if you do not have swelling of your hands, face, legs, or feet).  You get exposed to [place] measles and have never had them.  You are exposed to fifth disease or chickenpox.  You develop belly (abdominal) pain. Round ligament discomfort is a common non-cancerous (benign) cause of abdominal pain in pregnancy. Your caregiver still must evaluate this.  You develop headache, fever, diarrhea, pain with urination, or shortness of breath.  You fall or are in a car accident or have any kind of trauma.  There is mental or physical violence in your home. Document Released: 02/19/2001 Document Revised: 05/20/2011  Document Reviewed: 08/23/2008 [facility] Patient Information 2013 [place], [place].  Place 10-18 weeks prenatal visit patient instructions here.

## 2012-05-28 ENCOUNTER — Other Ambulatory Visit: Payer: Self-pay | Admitting: Obstetrics & Gynecology

## 2012-05-28 DIAGNOSIS — Z3682 Encounter for antenatal screening for nuchal translucency: Secondary | ICD-10-CM

## 2012-05-28 MED ORDER — OB COMPLETE PETITE 35-5-1-200 MG PO CAPS: 1.0000 | ORAL_CAPSULE | Freq: Every day | ORAL | Status: DC

## 2012-05-28 NOTE — Telephone Encounter (Signed)
Per Dr. Jean RosenthalChristell Constant suppose to be given prescription at visit.

## 2012-06-09 ENCOUNTER — Encounter (HOSPITAL_COMMUNITY): Payer: Self-pay | Admitting: Obstetrics & Gynecology

## 2012-06-16 ENCOUNTER — Ambulatory Visit (HOSPITAL_COMMUNITY)
Admission: RE | Admit: 2012-06-16 | Discharge: 2012-06-16 | Disposition: A | Discharge status: Home / Self Care | Payer: Medicaid Other | Source: Ambulatory Visit | Attending: Obstetrics & Gynecology | Admitting: Obstetrics & Gynecology

## 2012-06-16 ENCOUNTER — Other Ambulatory Visit: Payer: Self-pay

## 2012-06-16 ENCOUNTER — Encounter: Payer: Self-pay | Admitting: Obstetrics & Gynecology

## 2012-06-16 DIAGNOSIS — O3510X0 Maternal care for (suspected) chromosomal abnormality in fetus, unspecified, not applicable or unspecified: Secondary | ICD-10-CM | POA: Insufficient documentation

## 2012-06-16 DIAGNOSIS — O351XX0 Maternal care for (suspected) chromosomal abnormality in fetus, not applicable or unspecified: Secondary | ICD-10-CM | POA: Insufficient documentation

## 2012-06-16 DIAGNOSIS — Z3682 Encounter for antenatal screening for nuchal translucency: Secondary | ICD-10-CM

## 2012-06-16 DIAGNOSIS — Z3689 Encounter for other specified antenatal screening: Secondary | ICD-10-CM | POA: Insufficient documentation

## 2012-06-16 NOTE — Progress Notes (Signed)
Ms. Menton was seen for ultrasound appointment today.  Please see [facility] report for details.

## 2012-06-19 ENCOUNTER — Encounter: Payer: Self-pay | Admitting: Obstetrics & Gynecology

## 2012-06-30 ENCOUNTER — Inpatient Hospital Stay (HOSPITAL_COMMUNITY)
Admission: AD | Admit: 2012-06-30 | Discharge: 2012-06-30 | Disposition: A | Discharge status: Home / Self Care | Payer: Medicaid Other | Source: Ambulatory Visit | Attending: Obstetrics | Admitting: Obstetrics

## 2012-06-30 ENCOUNTER — Encounter (HOSPITAL_COMMUNITY): Payer: Self-pay

## 2012-06-30 DIAGNOSIS — O219 Vomiting of pregnancy, unspecified: Secondary | ICD-10-CM

## 2012-06-30 DIAGNOSIS — O210 Mild hyperemesis gravidarum: Secondary | ICD-10-CM

## 2012-06-30 DIAGNOSIS — R51 Headache: Secondary | ICD-10-CM | POA: Insufficient documentation

## 2012-06-30 HISTORY — DX: Depression, unspecified: F32.A

## 2012-06-30 HISTORY — DX: Major depressive disorder, single episode, unspecified: F32.9

## 2012-06-30 LAB — URINALYSIS, ROUTINE W REFLEX MICROSCOPIC
Glucose, UA: NEGATIVE mg/dL
Leukocytes, UA: NEGATIVE
Specific Gravity, Urine: 1.02 (ref 1.005–1.030)
pH: 7 (ref 5.0–8.0)

## 2012-06-30 MED ORDER — ONDANSETRON HCL 4 MG PO TABS: 4.0000 mg | ORAL_TABLET | Freq: Four times a day (QID) | ORAL | Status: DC

## 2012-06-30 MED ORDER — ACETAMINOPHEN 500 MG PO TABS
1000.0000 mg | ORAL_TABLET | Freq: Once | ORAL | Status: AC
  Administered 2012-06-30: 1000 mg via ORAL
  Filled 2012-06-30: qty 2

## 2012-06-30 MED ORDER — ONDANSETRON 8 MG PO TBDP
8.0000 mg | ORAL_TABLET | Freq: Once | ORAL | Status: AC
  Administered 2012-06-30: 8 mg via ORAL
  Filled 2012-06-30: qty 1

## 2012-06-30 MED ORDER — PROMETHAZINE HCL 12.5 MG PO TABS: 12.5000 mg | ORAL_TABLET | Freq: Four times a day (QID) | ORAL | Status: DC | PRN

## 2012-06-30 NOTE — MAU Note (Signed)
Pt states has been having excessive vomiting, feels dehydrated, having bad headaches. Yesterday felt like she was going to pass out. Denies bleeding or vag d/c changes. No pain.

## 2012-06-30 NOTE — MAU Provider Note (Signed)
History     CSN: [id number]  Arrival date and time: 06/30/12 4098   First Provider Initiated Contact with Patient 06/30/12 [phone]      Chief Complaint  Patient presents with  . Emesis   HPI [name] is a 22 y.o. G1P0 at [redacted]w[redacted]d who presents to MAU today with complaint of N/V and headache. The patient states that the headache started about 1 week ago. She has not tried any pain medication. She rates her pain at 8/10 now. She has had the N/V for a few days. She has been trying to at least drink fluids, but still "feels very dry." She has also been feeling faint since yesterday. She denies LOC. She also denies abdominal pain, cramping, vaginal bleeding, discharge or UTI symptoms. No sick contacts. Unsure about fever, did not take temperature. She denies any complications with this pregnancy or chronic health problems.   OB History   Grav Para Term Preterm Abortions TAB SAB Ect Mult Living   1               Past Medical History  Diagnosis Date  . Polycystic ovary disease   . Depression     Past Surgical History  Procedure Laterality Date  . Ear examination under anesthesia      Family History  Problem Relation Age of Onset  . Heart disease Maternal Uncle   . Diabetes Maternal Grandmother     History  Substance Use Topics  . Smoking status: Former Smoker    Quit date: 04/18/2012  . Smokeless tobacco: Never Used  . Alcohol Use: No    Allergies:  Allergies  Allergen Reactions  . Penicillins Hives    Prescriptions prior to admission  Medication Sig Dispense Refill  . Prenat-FeCbn-FeAspGl-FA-Omega (OB COMPLETE PETITE) 35-5-1-200 MG CAPS Take 1 capsule by mouth daily.  30 capsule  12    Review of Systems  Constitutional: Negative for fever, chills and malaise/fatigue.  Gastrointestinal: Positive for nausea and vomiting. Negative for abdominal pain, diarrhea and constipation.  Genitourinary: Negative for dysuria, urgency and frequency.       Neg - vaginal  bleeding, discharge  Neurological: Positive for dizziness and headaches. Negative for loss of consciousness and weakness.   Physical Exam   Blood pressure 122/65, pulse 70, temperature 98 F (36.7 C), temperature source Oral, resp. rate 16, height 6' (1.829 m), weight 195 lb 2 oz (88.508 kg), last menstrual period 03/14/2012, SpO2 100.00%.  Physical Exam  Constitutional: She is oriented to person, place, and time. She appears well-developed and well-nourished. No distress.  HENT:  Head: Normocephalic and atraumatic.  Cardiovascular: Normal rate, regular rhythm and normal heart sounds.   Respiratory: Effort normal and breath sounds normal. No respiratory distress.  GI: Soft. Bowel sounds are normal. She exhibits no distension and no mass. There is no tenderness. There is no rebound and no guarding.  Neurological: She is alert and oriented to person, place, and time.  Skin: Skin is warm. No erythema. No pallor.  Psychiatric: She has a normal mood and affect.   Results for orders placed during the hospital encounter of 06/30/12 (from the past 24 hour(s))  URINALYSIS, ROUTINE W REFLEX MICROSCOPIC     Status: None   Collection Time    06/30/12  8:30 AM      Result Value Range   Color, Urine YELLOW  YELLOW   APPearance CLEAR  CLEAR   Specific Gravity, Urine 1.020  1.005 - 1.030  pH 7.0  5.0 - 8.0   Glucose, UA NEGATIVE  NEGATIVE mg/dL   Hgb urine dipstick NEGATIVE  NEGATIVE   Bilirubin Urine NEGATIVE  NEGATIVE   Ketones, ur NEGATIVE  NEGATIVE mg/dL   Protein, ur NEGATIVE  NEGATIVE mg/dL   Urobilinogen, UA 0.2  0.0 - 1.0 mg/dL   Nitrite NEGATIVE  NEGATIVE   Leukocytes, UA NEGATIVE  NEGATIVE    MAU Course  Procedures None  MDM UA shows no signs of dehydration ODT Zofran and Tylenol in MAU today - Patient able to tolerate PO without N/V  Assessment and Plan  A: Nausea and vomiting in pregnancy prior to [redacted] weeks GA  P: Discharge home  Rx for Zofran and Phenergan sent to  patient's pharmacy Patient encouraged to increase PO hydration as tolerated Patient encouraged keep next scheduled follow-up with Dr. Tamela Oddi Patient may return to MAU as needed or if her condition were to change or worsen  Freddi Starr, PA-C  06/30/2012, 9:05 AM

## 2012-07-01 ENCOUNTER — Ambulatory Visit (INDEPENDENT_AMBULATORY_CARE_PROVIDER_SITE_OTHER): Payer: Medicaid Other | Admitting: Obstetrics & Gynecology

## 2012-07-01 ENCOUNTER — Encounter: Payer: Self-pay | Admitting: Obstetrics & Gynecology

## 2012-07-01 VITALS — BP 111/62 | Temp 98.8°F | Wt 196.0 lb

## 2012-07-01 DIAGNOSIS — Z3400 Encounter for supervision of normal first pregnancy, unspecified trimester: Secondary | ICD-10-CM

## 2012-07-01 LAB — POCT URINALYSIS DIPSTICK
Bilirubin, UA: NEGATIVE
Glucose, UA: NEGATIVE
Nitrite, UA: NEGATIVE
Urobilinogen, UA: NEGATIVE

## 2012-07-01 NOTE — Progress Notes (Signed)
Doing well 

## 2012-07-01 NOTE — Progress Notes (Signed)
Pulse-60 No complaints.

## 2012-07-01 NOTE — Patient Instructions (Signed)
AFP Maternal This is a routine screen (tests) used to check for fetal abnormalities such as Down syndrome and neural tube defects. Down Syndrome is a chromosomal abnormality, sometimes called Trisomy 21. Neural tube defects are serious birth defects. The brain, spinal cord, or their coverings do not develop completely. Women should be tested in the 15th to 20th week of pregnancy. The msAFP screen involves three or four tests that measure substances found in the blood that make the testing better. During development, AFP levels in fetal blood and amniotic fluid rise until about 12 weeks. The levels then gradually fall until birth. AFP is a protein produce by fetal tissue. AFP crosses the placenta and appears in the maternal blood. A baby with an open neural tube defect has an opening in its spine, head, or abdominal wall that allows higher-than-usual amounts of AFP to pass into the mother's blood. If a screen is positive, more tests are needed to make a diagnosis. These include ultrasound and perhaps amniocentesis (checking the fluid that surrounds the baby). These tests are used to help women and their caregivers make decisions about the management of their pregnancies. In pregnancies where the fetus is carrying the chromosomal defect that results in Down syndrome, the levels of AFP and unconjugated estriol tend to be low and hCG and inhibin A levels high.  PREPARATION FOR TEST Blood is drawn from a vein in your arm usually between the 15th and 20th weeks of pregnancy. Four different tests on your blood are done. These are AFP, hCG, unconjugated estriol, and inhibin A. The combination of tests produces a more accurate result. NORMAL FINDINGS   Adult: less than 40ng/mL or less than 40 mg/L (SI units)  Child younger than1 year: less than 30 ng/mL Ranges are stratified by weeks of gestation and vary among laboratories. Ranges for normal findings may vary among different laboratories and hospitals. You  should always check with your doctor after having lab work or other tests done to discuss the meaning of your test results and whether your values are considered within normal limits. MEANING OF TEST  These are screening tests. Not all fetal abnormalities will give positive test results. Of all women who have positive AFP screening results, only a very small number of them have babies who actually have a neural tube defect or chromosomal abnormality. Your caregiver will go over the test results with you and discuss the importance and meaning of your results, as well as treatment options and the need for additional tests if necessary. OBTAINING THE TEST RESULTS It is your responsibility to obtain your test results. Ask the lab or department performing the test when and how you will get your results. Document Released: 03/19/2004 Document Revised: 05/20/2011 Document Reviewed: 01/30/2008 [employer] Patient Information 2013 [employer].  

## 2012-07-07 ENCOUNTER — Encounter: Payer: Self-pay | Admitting: *Deleted

## 2012-07-09 ENCOUNTER — Encounter: Payer: Self-pay | Admitting: *Deleted

## 2012-07-22 ENCOUNTER — Ambulatory Visit (INDEPENDENT_AMBULATORY_CARE_PROVIDER_SITE_OTHER): Payer: Medicaid Other

## 2012-07-22 DIAGNOSIS — Z1389 Encounter for screening for other disorder: Secondary | ICD-10-CM

## 2012-07-22 DIAGNOSIS — Z3400 Encounter for supervision of normal first pregnancy, unspecified trimester: Secondary | ICD-10-CM

## 2012-07-22 LAB — US OB DETAIL + 14 WK

## 2012-07-23 ENCOUNTER — Encounter: Payer: Self-pay | Admitting: Obstetrics & Gynecology

## 2012-07-29 ENCOUNTER — Encounter: Payer: Self-pay | Admitting: Obstetrics & Gynecology

## 2012-07-29 ENCOUNTER — Ambulatory Visit (INDEPENDENT_AMBULATORY_CARE_PROVIDER_SITE_OTHER): Payer: Medicaid Other | Admitting: Obstetrics & Gynecology

## 2012-07-29 VITALS — BP 114/70 | Temp 98.5°F | Wt 197.0 lb

## 2012-07-29 DIAGNOSIS — Z3400 Encounter for supervision of normal first pregnancy, unspecified trimester: Secondary | ICD-10-CM

## 2012-07-29 DIAGNOSIS — Z3402 Encounter for supervision of normal first pregnancy, second trimester: Secondary | ICD-10-CM

## 2012-07-29 LAB — POCT URINALYSIS DIPSTICK
Bilirubin, UA: NEGATIVE
Blood, UA: NEGATIVE
Glucose, UA: NEGATIVE
Leukocytes, UA: NEGATIVE
Nitrite, UA: NEGATIVE

## 2012-07-29 NOTE — Progress Notes (Signed)
Pulse 66 Pt states she feels the baby putting pressure on her bladder causing her to have to go to the bathroom frequently.

## 2012-08-26 ENCOUNTER — Ambulatory Visit (INDEPENDENT_AMBULATORY_CARE_PROVIDER_SITE_OTHER): Payer: Medicaid Other | Admitting: Obstetrics

## 2012-08-26 VITALS — BP 134/78 | Temp 98.3°F | Wt 206.8 lb

## 2012-08-26 DIAGNOSIS — Z3402 Encounter for supervision of normal first pregnancy, second trimester: Secondary | ICD-10-CM

## 2012-08-26 DIAGNOSIS — Z3400 Encounter for supervision of normal first pregnancy, unspecified trimester: Secondary | ICD-10-CM

## 2012-08-26 LAB — POCT URINALYSIS DIPSTICK
Glucose, UA: NEGATIVE
Nitrite, UA: NEGATIVE
Protein, UA: NEGATIVE
Spec Grav, UA: 1.015
Urobilinogen, UA: NEGATIVE

## 2012-08-26 NOTE — Progress Notes (Signed)
Pulse: 78

## 2012-08-28 ENCOUNTER — Encounter: Payer: Self-pay | Admitting: Obstetrics

## 2012-09-10 ENCOUNTER — Ambulatory Visit (INDEPENDENT_AMBULATORY_CARE_PROVIDER_SITE_OTHER): Payer: Medicaid Other | Admitting: Obstetrics & Gynecology

## 2012-09-10 VITALS — BP 130/75 | Temp 98.5°F | Wt 210.4 lb

## 2012-09-10 DIAGNOSIS — Z3400 Encounter for supervision of normal first pregnancy, unspecified trimester: Secondary | ICD-10-CM

## 2012-09-10 DIAGNOSIS — Z3402 Encounter for supervision of normal first pregnancy, second trimester: Secondary | ICD-10-CM

## 2012-09-10 DIAGNOSIS — O0990 Supervision of high risk pregnancy, unspecified, unspecified trimester: Secondary | ICD-10-CM

## 2012-09-10 LAB — POCT URINALYSIS DIPSTICK
Leukocytes, UA: NEGATIVE
Nitrite, UA: NEGATIVE
Protein, UA: NEGATIVE
Urobilinogen, UA: NEGATIVE
pH, UA: 5

## 2012-09-10 NOTE — Patient Instructions (Addendum)
Glucose Tolerance Test This is a test to see how your body processes carbohydrates. This test is often done to check patients for diabetes or the possibility of developing it. PREPARATION FOR TEST You should have nothing to eat or drink 12 hours before the test. You will be given a form of sugar (glucose) and then blood samples will be drawn from your vein to determine the level of sugar in your blood. Alternatively, blood may be drawn from your finger for testing. You should not smoke or exercise during the test. NORMAL FINDINGS  Fasting: 70-115 mg/dL  30 minutes: less than 200 mg/dL  1 hour: less than 161 mg/dL  2 hours: less than 096 mg/dL  3 hours: 04-540 mg/dL  4 hours: 98-119 mg/dL Ranges for normal findings may vary among different laboratories and hospitals. You should always check with your doctor after having lab work or other tests done to discuss the meaning of your test results and whether your values are considered within normal limits. MEANING OF TEST Your caregiver will go over the test results with you and discuss the importance and meaning of your results, as well as treatment options and the need for additional tests. OBTAINING THE TEST RESULTS It is your responsibility to obtain your test results. Ask the lab or department performing the test when and how you will get your results. Document Released: 03/20/2004 Document Revised: 05/20/2011 Document Reviewed: 02/06/2008 [facility] Patient Information 2014 [place], [place]. Pregnancy - Second Trimester The second trimester is the period between 13 to 27 weeks of your pregnancy. It is important to follow your doctor's instructions. HOME CARE   Do not smoke.  Do not drink alcohol or use drugs.  Only take medicine as told by your doctor.  Take prenatal vitamins as told. The vitamin should contain 1 milligram of folic acid.  Exercise.  Eat healthy foods. Eat regular, well-balanced meals.  You can have sex  (intercourse) if there are no other problems with the pregnancy.  Do not use hot tubs, steam rooms, or saunas.  Wear a seat belt while driving.  Avoid raw meat, uncooked cheese, and litter boxes and soil used by cats.  Visit your dentist. Shirlee Limerick are okay. GET HELP RIGHT AWAY IF:   You have a temperature by mouth above 102 F (38.9 C), not controlled by medicine.  Fluid is coming from your vagina.  Blood is coming from your vagina. Light spotting is common, especially after sex (intercourse).  You have a bad smelling fluid (discharge) coming from the vagina. The fluid changes from clear to white.  You still feel sick to your stomach (nauseous).  You throw up (vomit) blood.  You lose or gain more than 2 pounds (0.9 kilograms) of weight in a week, or as suggested by your doctor.  Your face, hands, feet, or legs get puffy (swell).  You get exposed to [place] measles and have never had them.  You get exposed to fifth disease or chickenpox.  You have belly (abdominal) pain.  You have a bad headache that will not go away.  You have watery poop (diarrhea), pain when you pee (urinate), or have shortness of breath.  You start to have problems seeing (blurry or double vision).  You fall, are in a car accident, or have any kind of trauma.  There is mental or physical violence at home.  You have any concerns or worries during your pregnancy. MAKE SURE YOU:   Understand these instructions.  Will watch your condition.  Will get help right away if you are not doing well or get worse. Document Released: 05/22/2009 Document Revised: 05/20/2011 Document Reviewed: 05/22/2009 [facility] Patient Information 2014 [place], [place].

## 2012-09-10 NOTE — Progress Notes (Signed)
Pulse 72, patient has concerns regarding weight gain

## 2012-09-21 ENCOUNTER — Encounter: Payer: Self-pay | Admitting: Obstetrics & Gynecology

## 2012-09-23 ENCOUNTER — Encounter: Payer: Medicaid Other | Admitting: Obstetrics & Gynecology

## 2012-09-24 ENCOUNTER — Ambulatory Visit (INDEPENDENT_AMBULATORY_CARE_PROVIDER_SITE_OTHER): Payer: Medicaid Other | Admitting: Obstetrics & Gynecology

## 2012-09-24 VITALS — BP 126/75 | Temp 98.4°F | Wt 212.8 lb

## 2012-09-24 DIAGNOSIS — Z3402 Encounter for supervision of normal first pregnancy, second trimester: Secondary | ICD-10-CM

## 2012-09-24 DIAGNOSIS — Z3400 Encounter for supervision of normal first pregnancy, unspecified trimester: Secondary | ICD-10-CM

## 2012-09-24 LAB — POCT URINALYSIS DIPSTICK
Blood, UA: NEGATIVE
Leukocytes, UA: NEGATIVE
Nitrite, UA: NEGATIVE
Protein, UA: NEGATIVE
pH, UA: 7

## 2012-09-24 NOTE — Progress Notes (Signed)
Pulse- 80 

## 2012-09-28 ENCOUNTER — Encounter: Payer: Self-pay | Admitting: Obstetrics & Gynecology

## 2012-09-28 NOTE — Progress Notes (Signed)
Doing well 

## 2012-09-28 NOTE — Patient Instructions (Signed)
Pregnancy - Second Trimester The second trimester is the period between 13 to 27 weeks of your pregnancy. It is important to follow your doctor's instructions. HOME CARE   Do not smoke.  Do not drink alcohol or use drugs.  Only take medicine as told by your doctor.  Take prenatal vitamins as told. The vitamin should contain 1 milligram of folic acid.  Exercise.  Eat healthy foods. Eat regular, well-balanced meals.  You can have sex (intercourse) if there are no other problems with the pregnancy.  Do not use hot tubs, steam rooms, or saunas.  Wear a seat belt while driving.  Avoid raw meat, uncooked cheese, and litter boxes and soil used by cats.  Visit your dentist. Cleanings are okay. GET HELP RIGHT AWAY IF:   You have a temperature by mouth above 102 F (38.9 C), not controlled by medicine.  Fluid is coming from your vagina.  Blood is coming from your vagina. Light spotting is common, especially after sex (intercourse).  You have a bad smelling fluid (discharge) coming from the vagina. The fluid changes from clear to white.  You still feel sick to your stomach (nauseous).  You throw up (vomit) blood.  You lose or gain more than 2 pounds (0.9 kilograms) of weight in a week, or as suggested by your doctor.  Your face, hands, feet, or legs get puffy (swell).  You get exposed to [place] measles and have never had them.  You get exposed to fifth disease or chickenpox.  You have belly (abdominal) pain.  You have a bad headache that will not go away.  You have watery poop (diarrhea), pain when you pee (urinate), or have shortness of breath.  You start to have problems seeing (blurry or double vision).  You fall, are in a car accident, or have any kind of trauma.  There is mental or physical violence at home.  You have any concerns or worries during your pregnancy. MAKE SURE YOU:   Understand these instructions.  Will watch your condition.  Will get help  right away if you are not doing well or get worse. Document Released: 05/22/2009 Document Revised: 05/20/2011 Document Reviewed: 05/22/2009 [employer] Patient Information 2014 [employer].  

## 2012-09-29 ENCOUNTER — Encounter: Payer: Self-pay | Admitting: Obstetrics

## 2012-10-05 ENCOUNTER — Ambulatory Visit (INDEPENDENT_AMBULATORY_CARE_PROVIDER_SITE_OTHER): Payer: Medicaid Other | Admitting: Obstetrics & Gynecology

## 2012-10-05 ENCOUNTER — Other Ambulatory Visit: Payer: Medicaid Other

## 2012-10-05 VITALS — BP 128/76 | Temp 98.1°F | Wt 217.0 lb

## 2012-10-05 DIAGNOSIS — Z3402 Encounter for supervision of normal first pregnancy, second trimester: Secondary | ICD-10-CM

## 2012-10-05 DIAGNOSIS — Z3400 Encounter for supervision of normal first pregnancy, unspecified trimester: Secondary | ICD-10-CM

## 2012-10-05 DIAGNOSIS — Z3403 Encounter for supervision of normal first pregnancy, third trimester: Secondary | ICD-10-CM

## 2012-10-05 DIAGNOSIS — Z23 Encounter for immunization: Secondary | ICD-10-CM

## 2012-10-05 LAB — POCT URINALYSIS DIPSTICK
Blood, UA: NEGATIVE
Glucose, UA: NEGATIVE
Nitrite, UA: NEGATIVE
Spec Grav, UA: 1.02
Urobilinogen, UA: NEGATIVE
pH, UA: 5

## 2012-10-05 NOTE — Progress Notes (Signed)
Pulse: 67

## 2012-10-06 LAB — GLUCOSE TOLERANCE, 2 HOURS W/ 1HR
Glucose, 1 hour: 74 mg/dL (ref 70–170)
Glucose, 2 hour: 66 mg/dL — ABNORMAL LOW (ref 70–139)

## 2012-10-06 LAB — CBC
HCT: 35.9 % — ABNORMAL LOW (ref 36.0–46.0)
Hemoglobin: 12 g/dL (ref 12.0–15.0)
MCH: 30.1 pg (ref 26.0–34.0)
MCHC: 33.4 g/dL (ref 30.0–36.0)
RBC: 3.99 MIL/uL (ref 3.87–5.11)

## 2012-10-06 LAB — HIV ANTIBODY (ROUTINE TESTING W REFLEX): HIV: NONREACTIVE

## 2012-10-06 LAB — RPR

## 2012-10-07 ENCOUNTER — Other Ambulatory Visit: Payer: Medicaid Other

## 2012-10-14 ENCOUNTER — Encounter (HOSPITAL_COMMUNITY): Payer: Self-pay | Admitting: Emergency Medicine

## 2012-10-14 ENCOUNTER — Encounter (HOSPITAL_COMMUNITY): Payer: Self-pay | Admitting: *Deleted

## 2012-10-14 ENCOUNTER — Inpatient Hospital Stay (HOSPITAL_COMMUNITY)
Admission: AD | Admit: 2012-10-14 | Discharge: 2012-10-17 | DRG: 774 | Disposition: A | Discharge status: Home / Self Care | Payer: Medicaid Other | Source: Ambulatory Visit | Attending: Obstetrics & Gynecology | Admitting: Obstetrics & Gynecology

## 2012-10-14 ENCOUNTER — Emergency Department (HOSPITAL_COMMUNITY)
Admission: EM | Admit: 2012-10-14 | Discharge: 2012-10-14 | Disposition: A | Discharge status: Home / Self Care | Payer: Medicaid Other | Source: Home / Self Care | Attending: Emergency Medicine | Admitting: Emergency Medicine

## 2012-10-14 DIAGNOSIS — O479 False labor, unspecified: Secondary | ICD-10-CM

## 2012-10-14 DIAGNOSIS — B9689 Other specified bacterial agents as the cause of diseases classified elsewhere: Secondary | ICD-10-CM

## 2012-10-14 DIAGNOSIS — O328XX0 Maternal care for other malpresentation of fetus, not applicable or unspecified: Secondary | ICD-10-CM | POA: Diagnosis present

## 2012-10-14 DIAGNOSIS — N760 Acute vaginitis: Secondary | ICD-10-CM

## 2012-10-14 DIAGNOSIS — N390 Urinary tract infection, site not specified: Secondary | ICD-10-CM

## 2012-10-14 DIAGNOSIS — O4590 Premature separation of placenta, unspecified, unspecified trimester: Principal | ICD-10-CM | POA: Diagnosis present

## 2012-10-14 LAB — CBC WITH DIFFERENTIAL/PLATELET
HCT: 36.6 % (ref 36.0–46.0)
Hemoglobin: 13 g/dL (ref 12.0–15.0)
Lymphocytes Relative: 20 % (ref 12–46)
Lymphs Abs: 2.3 10*3/uL (ref 0.7–4.0)
Monocytes Absolute: 0.8 10*3/uL (ref 0.1–1.0)
Monocytes Relative: 7 % (ref 3–12)
Neutro Abs: 8.2 10*3/uL — ABNORMAL HIGH (ref 1.7–7.7)
Neutrophils Relative %: 72 % (ref 43–77)
RBC: 4.14 MIL/uL (ref 3.87–5.11)

## 2012-10-14 LAB — WET PREP, GENITAL
Trich, Wet Prep: NONE SEEN
Yeast Wet Prep HPF POC: NONE SEEN

## 2012-10-14 LAB — URINALYSIS, ROUTINE W REFLEX MICROSCOPIC
Bilirubin Urine: NEGATIVE
Glucose, UA: NEGATIVE mg/dL
Hgb urine dipstick: NEGATIVE
Specific Gravity, Urine: 1.028 (ref 1.005–1.030)
Urobilinogen, UA: 0.2 mg/dL (ref 0.0–1.0)
pH: 6 (ref 5.0–8.0)

## 2012-10-14 LAB — COMPREHENSIVE METABOLIC PANEL
Albumin: 3.1 g/dL — ABNORMAL LOW (ref 3.5–5.2)
Alkaline Phosphatase: 52 U/L (ref 39–117)
BUN: 10 mg/dL (ref 6–23)
CO2: 22 mEq/L (ref 19–32)
Chloride: 104 mEq/L (ref 96–112)
Creatinine, Ser: 0.65 mg/dL (ref 0.50–1.10)
GFR calc Af Amer: >90 mL/min (ref >90)
GFR calc non Af Amer: >90 mL/min (ref >90)
Glucose, Bld: 90 mg/dL (ref 70–99)
Potassium: 4 mEq/L (ref 3.5–5.1)
Total Bilirubin: 0.2 mg/dL — ABNORMAL LOW (ref 0.3–1.2)

## 2012-10-14 LAB — HCG, QUANTITATIVE, PREGNANCY: hCG, Beta Chain, Quant, S: 7916 m[IU]/mL — ABNORMAL HIGH (ref <5)

## 2012-10-14 MED ORDER — NITROFURANTOIN MONOHYD MACRO 100 MG PO CAPS: 100.0000 mg | ORAL_CAPSULE | Freq: Two times a day (BID) | ORAL | Status: DC

## 2012-10-14 MED ORDER — NIFEDIPINE 10 MG PO CAPS
10.0000 mg | ORAL_CAPSULE | ORAL | Status: DC | PRN
  Administered 2012-10-15: 10 mg via ORAL
  Filled 2012-10-14: qty 1

## 2012-10-14 MED ORDER — ONDANSETRON HCL 4 MG/2ML IJ SOLN
4.0000 mg | Freq: Once | INTRAMUSCULAR | Status: AC
  Administered 2012-10-15: 4 mg via INTRAVENOUS
  Filled 2012-10-14: qty 2

## 2012-10-14 MED ORDER — LACTATED RINGERS IV BOLUS (SEPSIS)
1000.0000 mL | Freq: Once | INTRAVENOUS | Status: AC
  Administered 2012-10-15: 1000 mL via INTRAVENOUS

## 2012-10-14 MED ORDER — METRONIDAZOLE 500 MG PO TABS: 500.0000 mg | ORAL_TABLET | Freq: Two times a day (BID) | ORAL | Status: DC

## 2012-10-14 NOTE — ED Notes (Signed)
Back from b/r, urine sent, returned to monitor and IV. Now understood that pt has been in w/r, was visiting a friend in ED who was sick, has not eaten or drank since 1600, given food and drink. OB RRT RN and EDNP at [facility].

## 2012-10-14 NOTE — MAU Note (Signed)
Pt reports tightening in stomach since 1900. Denies LOF of vag. bleeding

## 2012-10-14 NOTE — ED Notes (Addendum)
States she came to ED with a friend who is being seen. Hasn't had anything to eat or drink since 4pm. Given juice and crackers. Pt feels better after eating and drinking, monitored DC's. Discussed importance of eating and drinking enough during pregnancy and not getting run down and over tired

## 2012-10-14 NOTE — ED Provider Notes (Signed)
CSN: [id number]     Arrival date & time 10/14/12  0142 History     First MD Initiated Contact with Patient 10/14/12 0246     Chief Complaint  Patient presents with  . Abdominal Cramping   (Consider location/radiation/quality/duration/timing/severity/associated sxs/prior Treatment) Patient is a 22 y.o. female presenting with abdominal pain. The history is provided by the patient.  Abdominal Pain Pain location:  Generalized Pain quality: cramping   Pain severity now: 7/10. Onset quality:  Gradual Duration:  5 hours Timing:  Intermittent Progression:  Unchanged Chronicity:  New Context: not awakening from sleep, not recent travel and not suspicious food intake   Relieved by:  Nothing Worsened by:  Nothing tried Ineffective treatments:  None tried Associated symptoms: constipation and vaginal discharge   Associated symptoms: no chest pain, no chills, no cough, no diarrhea, no dysuria, no fever, no nausea, no shortness of breath, no sore throat, no vaginal bleeding and no vomiting   Risk factors: pregnancy    Candace Richardson is a 22 y.o. G1P0 @ [redacted]w[redacted]d gestation who presents to the ED with abdominal cramping that stated earlier tonight. She denies vaginal bleeding or UTI symptoms. Last sexual intercourse about 3 days ago. Having a problem with constipation. Also increased vaginal discharge that is yellow. Getting prenatal care with [facility]. Patient states she has not eaten or drank anything all day.   Past Medical History  Diagnosis Date  . Polycystic ovary disease   . Depression    Past Surgical History  Procedure Laterality Date  . Ear examination under anesthesia     Family History  Problem Relation Age of Onset  . Heart disease Maternal Uncle   . Diabetes Maternal Grandmother    History  Substance Use Topics  . Smoking status: Former Smoker    Quit date: 04/18/2012  . Smokeless tobacco: Never Used  . Alcohol Use: No   OB History   Grav Para Term Preterm Abortions TAB  SAB Ect Mult Living   1              Review of Systems  Constitutional: Negative for fever and chills.  HENT: Negative for congestion and sore throat.   Eyes: Negative for visual disturbance.  Respiratory: Negative for cough and shortness of breath.   Cardiovascular: Negative for chest pain and leg swelling.  Gastrointestinal: Positive for abdominal pain and constipation. Negative for nausea, vomiting and diarrhea.  Genitourinary: Positive for vaginal discharge. Negative for dysuria and vaginal bleeding.  Skin: Negative for rash.  Neurological: Negative for dizziness and headaches.  Psychiatric/Behavioral: The patient is not nervous/anxious.     Allergies  Penicillins  Home Medications   Current Outpatient Rx  Name  Route  Sig  Dispense  Refill  . Cholecalciferol (VITAMIN D PO)   Oral   Take 1 tablet by mouth every evening.         . Prenatal Vit-Fe Fumarate-FA (PRENATAL MULTIVITAMIN) TABS   Oral   Take 1 tablet by mouth daily at 12 noon.          BP 125/62  Pulse 69  Temp(Src) 98.1 F (36.7 C) (Oral)  Resp 16  SpO2 100%  LMP 03/14/2012 Physical Exam  Nursing note and vitals reviewed. Constitutional: She is oriented to person, place, and time. She appears well-developed and well-nourished. No distress.  HENT:  Head: Normocephalic.  Eyes: EOM are normal.  Neck: Neck supple.  Cardiovascular: Normal rate.   Pulmonary/Chest: Effort normal.  Abdominal: Soft. There is  no tenderness.  Gravid consistent with dates.  Genitourinary:  External genitalia without lesions. Thick frothy discharge vaginal vault. Cervix closed, thick, high. Uterus consistent with dates.  Musculoskeletal: Normal range of motion.  Neurological: She is alert and oriented to person, place, and time. No cranial nerve deficit.  Skin: Skin is warm and dry.  Psychiatric: She has a normal mood and affect. Her behavior is normal.   Results for orders placed during the hospital encounter of 10/14/12  (from the past 24 hour(s))  CBC WITH DIFFERENTIAL     Status: Abnormal   Collection Time    10/14/12  1:55 AM      Result Value Range   WBC 11.4 (*) 4.0 - 10.5 K/uL   RBC 4.14  3.87 - 5.11 MIL/uL   Hemoglobin 13.0  12.0 - 15.0 g/dL   HCT 45.4  09.8 - 11.9 %   MCV 88.4  78.0 - 100.0 fL   MCH 31.4  26.0 - 34.0 pg   MCHC 35.5  30.0 - 36.0 g/dL   RDW 14.7  82.9 - 56.2 %   Platelets 130 (*) 150 - 400 K/uL   Neutrophils Relative % 72  43 - 77 %   Neutro Abs 8.2 (*) 1.7 - 7.7 K/uL   Lymphocytes Relative 20  12 - 46 %   Lymphs Abs 2.3  0.7 - 4.0 K/uL   Monocytes Relative 7  3 - 12 %   Monocytes Absolute 0.8  0.1 - 1.0 K/uL   Eosinophils Relative 1  0 - 5 %   Eosinophils Absolute 0.1  0.0 - 0.7 K/uL   Basophils Relative 0  0 - 1 %   Basophils Absolute 0.0  0.0 - 0.1 K/uL  COMPREHENSIVE METABOLIC PANEL     Status: Abnormal   Collection Time    10/14/12  1:55 AM      Result Value Range   Sodium 135  135 - 145 mEq/L   Potassium 4.0  3.5 - 5.1 mEq/L   Chloride 104  96 - 112 mEq/L   CO2 22  19 - 32 mEq/L   Glucose, Bld 90  70 - 99 mg/dL   BUN 10  6 - 23 mg/dL   Creatinine, Ser 1.30  0.50 - 1.10 mg/dL   Calcium 9.0  8.4 - 86.5 mg/dL   Total Protein 6.7  6.0 - 8.3 g/dL   Albumin 3.1 (*) 3.5 - 5.2 g/dL   AST 15  0 - 37 U/L   ALT 12  0 - 35 U/L   Alkaline Phosphatase 52  39 - 117 U/L   Total Bilirubin 0.2 (*) 0.3 - 1.2 mg/dL   GFR calc non Af Amer >90  >90 mL/min   GFR calc Af Amer >90  >90 mL/min  HCG, QUANTITATIVE, PREGNANCY     Status: Abnormal   Collection Time    10/14/12  1:55 AM      Result Value Range   hCG, Beta Chain, Quant, S 7916 (*) <5 mIU/mL  URINALYSIS, ROUTINE W REFLEX MICROSCOPIC     Status: Abnormal   Collection Time    10/14/12  3:42 AM      Result Value Range   Color, Urine YELLOW  YELLOW   APPearance CLEAR  CLEAR   Specific Gravity, Urine 1.028  1.005 - 1.030   pH 6.0  5.0 - 8.0   Glucose, UA NEGATIVE  NEGATIVE mg/dL   Hgb urine dipstick NEGATIVE  NEGATIVE  Bilirubin Urine NEGATIVE  NEGATIVE   Ketones, ur NEGATIVE  NEGATIVE mg/dL   Protein, ur NEGATIVE  NEGATIVE mg/dL   Urobilinogen, UA 0.2  0.0 - 1.0 mg/dL   Nitrite NEGATIVE  NEGATIVE   Leukocytes, UA SMALL (*) NEGATIVE  URINE MICROSCOPIC-ADD ON     Status: Abnormal   Collection Time    10/14/12  3:42 AM      Result Value Range   Squamous Epithelial / LPF FEW (*) RARE   WBC, UA 7-10  <3 WBC/hpf   RBC / HPF 0-2  <3 RBC/hpf   Bacteria, UA RARE  RARE   Urine-Other MUCOUS PRESENT    WET PREP, GENITAL     Status: Abnormal   Collection Time    10/14/12  4:25 AM      Result Value Range   Yeast Wet Prep HPF POC NONE SEEN  NONE SEEN   Trich, Wet Prep NONE SEEN  NONE SEEN   Clue Cells Wet Prep HPF POC MODERATE (*) NONE SEEN   WBC, Wet Prep HPF POC TOO NUMEROUS TO COUNT (*) NONE SEEN    Rapid response OB nurse here to monitor patient.   ED Course: consult with Dr. Clearance Coots  EFM: baseline 145, initially was contracting every 6 to 8 minutes, after IV hydration and PO fluids and food, contractions have spaced out and patient feeling much better.  Procedures  MDM  22 y.o. female @ 29.[redacted] weeks gestation with abdominal cramping that resolved with IV and PO hydration. Early UTI and bacterial vaginosis. Encouraged patient to stay well  Hydrated and eat regularly. She is to call the office for follow up.  I have reviewed this patient's vital signs, nurses notes, appropriate labs and discussed findings and plan of care with the patient and she voices understanding.     Medication List    TAKE these medications       metroNIDAZOLE 500 MG tablet  Commonly known as:  FLAGYL  Take 1 tablet (500 mg total) by mouth 2 (two) times daily.     nitrofurantoin (macrocrystal-monohydrate) 100 MG capsule  Commonly known as:  MACROBID  Take 1 capsule (100 mg total) by mouth 2 (two) times daily.      ASK your doctor about these medications       prenatal multivitamin Tabs tablet  Take 1 tablet by mouth  daily at 12 noon.     VITAMIN D PO  Take 1 tablet by mouth every evening.          [address] [place], [place] 10/14/12 [phone]

## 2012-10-14 NOTE — ED Notes (Signed)
Fetal fibronectin not available on this campus per [facility] RRT RN, EDNP made aware.

## 2012-10-14 NOTE — ED Notes (Signed)
Pt alert, NAD, calm, interactive, resps e/u, skin W&D, speaking in clear complete sentences, here tonight for abd cramping r/t pregnancy, [redacted] weeks pregnant", cramping intermitant and c/w contractions (on toco/US at this time), also reports back pain and constipation. (denies:  nvd, fever, cough, cold sx, congestion, bleeding, vaginal or urinary sx or drainage, d/c).  OB RRT, RN arrives to [facility].

## 2012-10-14 NOTE — ED Notes (Signed)
Mayer Camel, EDNP at [facility] [place].

## 2012-10-14 NOTE — ED Notes (Addendum)
PT. REPORTS INTERMITTENT MID ABDOMINAL CRAMPING ONSET THIS EVENING , DENIES VAGINAL BLEEDING OR DISCHARGE , PT. IS [redacted] WEEKS PREGNANT , G1P0 , DR. Jean Rosenthal MOORE IS HER OB/GYNE , NO FEVER OR CHILLS. DENIES URINARY SYMPTOMS. PT. STATED SHE CAN FEEL FETAL MOVEMENTS . DENIES URINARY DISCOMFORT .

## 2012-10-14 NOTE — Progress Notes (Signed)
Call for G1 pt c/o contractions every 45 min since 11pm. Pt resting comfortably in bed, c/o IV in arm. States she saw MD on 7/28 and has another appointment at 8/12. No problems with pregnancy. Seen here by Mayer Camel NP

## 2012-10-15 ENCOUNTER — Encounter (HOSPITAL_COMMUNITY): Payer: Self-pay | Admitting: *Deleted

## 2012-10-15 ENCOUNTER — Inpatient Hospital Stay (HOSPITAL_COMMUNITY): Payer: Medicaid Other

## 2012-10-15 DIAGNOSIS — O4700 False labor before 37 completed weeks of gestation, unspecified trimester: Secondary | ICD-10-CM

## 2012-10-15 LAB — CBC
MCHC: 34.4 g/dL (ref 30.0–36.0)
RDW: 13.8 % (ref 11.5–15.5)

## 2012-10-15 LAB — POCT FERN TEST: POCT Fern Test: NEGATIVE

## 2012-10-15 LAB — RPR: RPR Ser Ql: NONREACTIVE

## 2012-10-15 MED ORDER — LIDOCAINE HCL (PF) 1 % IJ SOLN
30.0000 mL | INTRAMUSCULAR | Status: DC | PRN
  Filled 2012-10-15 (×2): qty 30

## 2012-10-15 MED ORDER — CITRIC ACID-SODIUM CITRATE 334-500 MG/5ML PO SOLN: 30.0000 mL | ORAL | Status: DC | PRN

## 2012-10-15 MED ORDER — IBUPROFEN 600 MG PO TABS
600.0000 mg | ORAL_TABLET | Freq: Four times a day (QID) | ORAL | Status: DC | PRN
  Administered 2012-10-15: 600 mg via ORAL
  Filled 2012-10-15: qty 1

## 2012-10-15 MED ORDER — BETAMETHASONE SOD PHOS & ACET 6 (3-3) MG/ML IJ SUSP
12.0000 mg | Freq: Once | INTRAMUSCULAR | Status: AC
  Administered 2012-10-15: 12 mg via INTRAMUSCULAR
  Filled 2012-10-15: qty 2

## 2012-10-15 MED ORDER — DIBUCAINE 1 % RE OINT: 1.0000 "application " | TOPICAL_OINTMENT | RECTAL | Status: DC | PRN

## 2012-10-15 MED ORDER — MAGNESIUM SULFATE 40 G IN LACTATED RINGERS - SIMPLE
1.0000 g/h | INTRAVENOUS | Status: DC
  Administered 2012-10-15: 1 g/h via INTRAVENOUS
  Filled 2012-10-15: qty 500

## 2012-10-15 MED ORDER — OXYTOCIN BOLUS FROM INFUSION
500.0000 mL | INTRAVENOUS | Status: DC
  Administered 2012-10-15: 500 mL via INTRAVENOUS

## 2012-10-15 MED ORDER — LACTATED RINGERS IV SOLN: 500.0000 mL | INTRAVENOUS | Status: DC | PRN

## 2012-10-15 MED ORDER — EPHEDRINE 5 MG/ML INJ
10.0000 mg | INTRAVENOUS | Status: DC | PRN
  Filled 2012-10-15: qty 2

## 2012-10-15 MED ORDER — ONDANSETRON HCL 4 MG/2ML IJ SOLN: 4.0000 mg | INTRAMUSCULAR | Status: DC | PRN

## 2012-10-15 MED ORDER — MAGNESIUM SULFATE BOLUS VIA INFUSION
4.0000 g | Freq: Once | INTRAVENOUS | Status: AC
  Administered 2012-10-15: 4 g via INTRAVENOUS
  Filled 2012-10-15: qty 500

## 2012-10-15 MED ORDER — LACTATED RINGERS IV SOLN: 500.0000 mL | Freq: Once | INTRAVENOUS | Status: DC

## 2012-10-15 MED ORDER — OXYCODONE-ACETAMINOPHEN 5-325 MG PO TABS
1.0000 | ORAL_TABLET | ORAL | Status: DC | PRN
  Administered 2012-10-15: 1 via ORAL
  Filled 2012-10-15: qty 1

## 2012-10-15 MED ORDER — ACETAMINOPHEN 325 MG PO TABS: 650.0000 mg | ORAL_TABLET | ORAL | Status: DC | PRN

## 2012-10-15 MED ORDER — LANOLIN HYDROUS EX OINT: TOPICAL_OINTMENT | CUTANEOUS | Status: DC | PRN

## 2012-10-15 MED ORDER — FERROUS SULFATE 325 (65 FE) MG PO TABS
325.0000 mg | ORAL_TABLET | Freq: Two times a day (BID) | ORAL | Status: DC
  Administered 2012-10-15 – 2012-10-17 (×6): 325 mg via ORAL
  Filled 2012-10-15 (×6): qty 1

## 2012-10-15 MED ORDER — DIPHENHYDRAMINE HCL 25 MG PO CAPS: 25.0000 mg | ORAL_CAPSULE | Freq: Four times a day (QID) | ORAL | Status: DC | PRN

## 2012-10-15 MED ORDER — PHENYLEPHRINE 40 MCG/ML (10ML) SYRINGE FOR IV PUSH (FOR BLOOD PRESSURE SUPPORT)
80.0000 ug | PREFILLED_SYRINGE | INTRAVENOUS | Status: DC | PRN
  Filled 2012-10-15: qty 2

## 2012-10-15 MED ORDER — FENTANYL 2.5 MCG/ML BUPIVACAINE 1/10 % EPIDURAL INFUSION (WH - ANES): 14.0000 mL/h | INTRAMUSCULAR | Status: DC | PRN

## 2012-10-15 MED ORDER — OXYTOCIN 40 UNITS IN LACTATED RINGERS INFUSION - SIMPLE MED
62.5000 mL/h | INTRAVENOUS | Status: DC
  Filled 2012-10-15: qty 1000

## 2012-10-15 MED ORDER — BETAMETHASONE SOD PHOS & ACET 6 (3-3) MG/ML IJ SUSP: 12.0000 mg | Freq: Once | INTRAMUSCULAR | Status: DC

## 2012-10-15 MED ORDER — WITCH HAZEL-GLYCERIN EX PADS: 1.0000 "application " | MEDICATED_PAD | CUTANEOUS | Status: DC | PRN

## 2012-10-15 MED ORDER — MAGNESIUM HYDROXIDE 400 MG/5ML PO SUSP: 30.0000 mL | ORAL | Status: DC | PRN

## 2012-10-15 MED ORDER — OXYCODONE-ACETAMINOPHEN 5-325 MG PO TABS
1.0000 | ORAL_TABLET | ORAL | Status: DC | PRN
  Administered 2012-10-15 – 2012-10-17 (×4): 1 via ORAL
  Filled 2012-10-15 (×4): qty 1

## 2012-10-15 MED ORDER — ONDANSETRON HCL 4 MG PO TABS: 4.0000 mg | ORAL_TABLET | ORAL | Status: DC | PRN

## 2012-10-15 MED ORDER — IBUPROFEN 600 MG PO TABS
600.0000 mg | ORAL_TABLET | Freq: Four times a day (QID) | ORAL | Status: DC
  Administered 2012-10-15 – 2012-10-17 (×9): 600 mg via ORAL
  Filled 2012-10-15 (×10): qty 1

## 2012-10-15 MED ORDER — LACTATED RINGERS IV SOLN
INTRAVENOUS | Status: DC
  Administered 2012-10-15: 01:00:00 via INTRAVENOUS

## 2012-10-15 MED ORDER — CEFAZOLIN SODIUM 1-5 GM-% IV SOLN
1.0000 g | Freq: Three times a day (TID) | INTRAVENOUS | Status: DC
  Filled 2012-10-15: qty 50

## 2012-10-15 MED ORDER — ONDANSETRON HCL 4 MG/2ML IJ SOLN: 4.0000 mg | Freq: Four times a day (QID) | INTRAMUSCULAR | Status: DC | PRN

## 2012-10-15 MED ORDER — CEFAZOLIN SODIUM-DEXTROSE 2-3 GM-% IV SOLR
2.0000 g | Freq: Once | INTRAVENOUS | Status: AC
  Administered 2012-10-15: 2 g via INTRAVENOUS
  Filled 2012-10-15: qty 50

## 2012-10-15 MED ORDER — DIPHENHYDRAMINE HCL 50 MG/ML IJ SOLN: 12.5000 mg | INTRAMUSCULAR | Status: DC | PRN

## 2012-10-15 MED ORDER — NALOXONE HCL 0.4 MG/ML IJ SOLN
INTRAMUSCULAR | Status: AC
  Filled 2012-10-15: qty 1

## 2012-10-15 MED ORDER — SENNOSIDES-DOCUSATE SODIUM 8.6-50 MG PO TABS
2.0000 | ORAL_TABLET | Freq: Every day | ORAL | Status: DC
  Administered 2012-10-15 – 2012-10-16 (×2): 2 via ORAL

## 2012-10-15 MED ORDER — ZOLPIDEM TARTRATE 5 MG PO TABS: 5.0000 mg | ORAL_TABLET | Freq: Every evening | ORAL | Status: DC | PRN

## 2012-10-15 MED ORDER — TETANUS-DIPHTH-ACELL PERTUSSIS 5-2.5-18.5 LF-MCG/0.5 IM SUSP: 0.5000 mL | Freq: Once | INTRAMUSCULAR | Status: DC

## 2012-10-15 MED ORDER — BUTORPHANOL TARTRATE 1 MG/ML IJ SOLN
1.0000 mg | Freq: Once | INTRAMUSCULAR | Status: AC
  Administered 2012-10-15: 1 mg via INTRAVENOUS
  Filled 2012-10-15: qty 1

## 2012-10-15 MED ORDER — BENZOCAINE-MENTHOL 20-0.5 % EX AERO: 1.0000 "application " | INHALATION_SPRAY | CUTANEOUS | Status: DC | PRN

## 2012-10-15 MED ORDER — PRENATAL MULTIVITAMIN CH
1.0000 | ORAL_TABLET | Freq: Every day | ORAL | Status: DC
  Administered 2012-10-15 – 2012-10-17 (×3): 1 via ORAL
  Filled 2012-10-15 (×3): qty 1

## 2012-10-15 MED ORDER — MEASLES, MUMPS & RUBELLA VAC ~~LOC~~ INJ
0.5000 mL | INJECTION | Freq: Once | SUBCUTANEOUS | Status: DC
  Filled 2012-10-15: qty 0.5

## 2012-10-15 NOTE — H&P (Signed)
Candace Richardson is a 22 y.o. female presenting for contractions. Maternal Medical History:  Reason for admission: Contractions and vaginal bleeding.   Contractions: Onset was 2 days ago.   Frequency: regular.   Perceived severity is strong.    Fetal activity: Perceived fetal activity is normal.    Prenatal complications: no prenatal complications Prenatal Complications - Diabetes: none.    OB History   Grav Para Term Preterm Abortions TAB SAB Ect Mult Living   1              Past Medical History  Diagnosis Date  . Polycystic ovary disease   . Depression    Past Surgical History  Procedure Laterality Date  . Ear examination under anesthesia     Family History: family history includes Diabetes in her maternal grandmother and Heart disease in her maternal uncle. Social History:  reports that she quit smoking about 5 months ago. She has never used smokeless tobacco. She reports that she does not drink alcohol or use illicit drugs.     Review of Systems  Constitutional: Negative for fever.  Eyes: Negative for blurred vision.  Respiratory: Negative for shortness of breath.   Gastrointestinal: Negative for vomiting.  Skin: Negative for rash.  Neurological: Negative for headaches.    Dilation: 7.5 Effacement (%): 100 Station: -2 Exam by:: A.Davis,RN Blood pressure 134/72, pulse 101, temperature 98.9 F (37.2 C), temperature source Oral, resp. rate 18, height 6' (1.829 m), weight 216 lb (97.977 kg), last menstrual period 03/14/2012. Maternal Exam:  Uterine Assessment: Contraction frequency is regular.   Abdomen: not evaluated.  Introitus: not evaluated.   Cervix: Cervix evaluated by sterile speculum exam.     Physical Exam  Constitutional: She appears well-developed.  HENT:  Head: Normocephalic.  Neck: Neck supple. No thyromegaly present.  Cardiovascular: Normal rate and regular rhythm.   Respiratory: Breath sounds normal.  GI: Soft. Bowel sounds are normal.    Skin: No rash noted.    Prenatal labs: ABO, Rh: A/Positive/-- (02/20 0000) Antibody: Negative (02/20 0000) Rubella: Immune (02/20 0000) RPR: NON REAC (07/28 1019)  HBsAg: Negative (02/20 0000)  HIV: NON REACTIVE (07/28 1019)  GBS:     Assessment/Plan: [place] @ [redacted]w[redacted]d.  Advanced PTL.  ?Marginal placental separation Category I FHT  Admit Steroids Attempt tocolysis w/MgSO4 GBS prophylaxis   JACKSON-MOORE,Malaky Tetrault A 10/15/2012, 2:34 AM

## 2012-10-15 NOTE — Lactation Note (Signed)
This note was copied from the chart of Candace Leonia Heatherly. Lactation Consultation Note    Initial consult with this mom of a NICU baby [redacted] weeks gestation, and 6 1/2 hours post partum. I set up a DEP and instructed mom in it's use in premie setting. I shoed mom hpw to hand express, and she return demonstrated with good technique. Mom had losts of easily expressed colostrum, collectin 3 mls to bring to baby. skln to skin encouraged, which mom was eager to try. Mom knows to call for questions/concerns.   Patient Name: Candace Richardson [id number] Date: 10/15/2012 Reason for consult: Initial assessment;NICU baby   Maternal Data Formula Feeding for Exclusion: Yes (baby in NICU) Infant to breast within first hour of birth: No Breastfeeding delayed due to:: Infant status Has patient been taught Hand Expression?: Yes Does the patient have breastfeeding experience prior to this delivery?: No  Feeding    LATCH Score/Interventions                      Lactation Tools Discussed/Used Tools: Pump Breast pump type: Double-Electric Breast Pump WIC Program: Yes (mom to call for DEP) Pump Review: Setup, frequency, and cleaning;Milk Storage;Other (comment) (hand expression, teaching form NICU book on EBM) Initiated by:: c Aniah Pauli rn  Date initiated:: 10/15/12 (at 11 am)   Consult Status Consult Status: Follow-up Date: 10/16/12 Follow-up type: In-patient    Alfred Levins 10/15/2012, 11:36 AM

## 2012-10-15 NOTE — Progress Notes (Signed)
UR chart review completed.  

## 2012-10-15 NOTE — Progress Notes (Signed)
CSW received call from [facility]'s RN stating someone from [facility]'s "program" is here and would like to speak with CSW.  She states this person has concerns about FOB.  RN states there have been some strange interactions between [facility] and FOB as well.  CSW did not have a chance to meet with [facility] or this support person today, but asked that RN staff get person's name and number and that CSW will follow up tomorrow.  Support person is Janelle Floor [phone] from [facility] through [employer].

## 2012-10-15 NOTE — Progress Notes (Signed)
MFM ultrasound  Indication: 22 yr old G1P0 at [redacted]w[redacted]d with preterm labor for fetal ultrasound. Remote read.  Findings: 1. Single intrauterine pregnancy. 2. Fundal placenta without evidence of previa. 3. No retroplacental bleed is seen. 4. Normal amniotic fluid index. 5. The fetus is in cephalic presentation. 6. Membranes are seen bulging with no measurable cervix.  Recommendations: 1. Preterm labor: - patient is admitted to L&D - recommend course of betamethasone - recommend consider tocolysis through steroid window - recommend consider magnesium sulfate for neuroprotection if delivery is felt to be imminent - consider GBS prophylaxis if delivery is imminent 2. Normal AFI  Eulis Foster, MD
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
## 2012-10-16 LAB — GC/CHLAMYDIA PROBE AMP: GC Probe RNA: UNDETERMINED

## 2012-10-16 MED ORDER — KETOROLAC TROMETHAMINE 30 MG/ML IJ SOLN
INTRAMUSCULAR | Status: AC
  Filled 2012-10-16: qty 1

## 2012-10-16 NOTE — Progress Notes (Signed)
10/16/12 1200  Clinical Encounter Type  Visited With Patient and family together (FOB)  Visit Type Initial;Spiritual support;Social support  Referral From Social work Lulu Riding, [place])  Spiritual Encounters  Spiritual Needs Emotional   Made initial visit with Awa and FOB to introduce spiritual care and chaplain availability.  She was quiet and answered questions sparsely, volunteering little.  She did smile and thank me for coming as I left.  FOB engaged in conversation, sharing some of his faith background and spiritual beliefs, and asking more about spiritual care services.  He reminded pt that it was nearly time to pump again, washed a bottle for storing breastmilk, and asked pt how soon they needed to refrigerate the previously expressed milk as we concluded our visit.    Provided pastoral presence, encouragement, and intro to services.  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  [address] [place], [place] [phone]

## 2012-10-16 NOTE — Progress Notes (Signed)
Post Partum Day 1 Subjective: no complaints  Objective: Blood pressure 129/60, pulse 76, temperature 98.5 F (36.9 C), temperature source Oral, resp. rate 18, height 6' (1.829 m), weight 216 lb (97.977 kg), last menstrual period 03/14/2012, SpO2 97.00%, unknown if currently breastfeeding.  Physical Exam:  General: alert and no distress Lochia: appropriate Uterine Fundus: firm Incision: healing well DVT Evaluation: No evidence of DVT seen on physical exam.   Recent Labs  10/14/12 0155 10/15/12 0010  HGB 13.0 12.9  HCT 36.6 37.5    Assessment/Plan: Plan for discharge tomorrow   LOS: 2 days   HARPER,CHARLES A 10/16/2012, 5:25 AM
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
## 2012-10-16 NOTE — ED Provider Notes (Signed)
Medical screening examination/treatment/procedure(s) were performed by non-physician practitioner and as supervising physician I was immediately available for consultation/collaboration.   Laray Anger, DO 10/16/12 [phone]

## 2012-10-17 MED ORDER — IBUPROFEN 600 MG PO TABS: 600.0000 mg | ORAL_TABLET | Freq: Four times a day (QID) | ORAL | Status: DC | PRN

## 2012-10-17 MED ORDER — OXYCODONE-ACETAMINOPHEN 5-325 MG PO TABS: 1.0000 | ORAL_TABLET | ORAL | Status: DC | PRN

## 2012-10-17 NOTE — Progress Notes (Signed)
Post Partum Day 2 Subjective: no complaints, up ad lib, voiding, tolerating PO and + flatus  Objective: Blood pressure 115/71, pulse 74, temperature 97.4 F (36.3 C), temperature source Oral, resp. rate 18, height 6' (1.829 m), weight 216 lb (97.977 kg), last menstrual period 03/14/2012, SpO2 96.00%, unknown if currently breastfeeding.  Physical Exam:  General: alert and no distress Lochia: appropriate Uterine Fundus: firm Incision: healing well DVT Evaluation: No evidence of DVT seen on physical exam.   Recent Labs  10/15/12 0010  HGB 12.9  HCT 37.5    Assessment/Plan: Discharge home   LOS: 3 days   Jeven Topper A 10/17/2012, 7:51 AM

## 2012-10-17 NOTE — Discharge Summary (Signed)
Obstetric Discharge Summary Reason for Admission: onset of labor Prenatal Procedures: ultrasound Intrapartum Procedures: spontaneous vaginal delivery Postpartum Procedures: none Complications-Operative and Postpartum: none Hemoglobin  Date Value Range Status  10/15/2012 12.9  12.0 - 15.0 g/dL Final  2/84/1324 40.1   Final     HCT  Date Value Range Status  10/15/2012 37.5  36.0 - 46.0 % Final  04/30/2012 41   Final    Physical Exam:  General: alert and no distress Lochia: appropriate Uterine Fundus: firm Incision: healing well DVT Evaluation: No evidence of DVT seen on physical exam.  Discharge Diagnoses: Preterm delivery.  Discharge Information: Date: 10/17/2012 Activity: pelvic rest Diet: routine Medications: PNV, Ibuprofen, Colace and Percocet Condition: stable Instructions: refer to practice specific booklet Discharge to: home Follow-up Information   Follow up with Antionette Char A, MD. Schedule an appointment as soon as possible for a visit in 2 weeks.   Contact information:   [address] [place] [address] [phone]       Newborn Data: Live born female  Birth Weight: 3 lb (1361 g) APGAR: 5, 8  Baby in NICU for prematurity.  Rece Zechman A 10/17/2012, 8:00 AM

## 2012-10-21 ENCOUNTER — Encounter: Payer: Medicaid Other | Admitting: Obstetrics & Gynecology

## 2012-10-21 ENCOUNTER — Ambulatory Visit: Payer: Self-pay

## 2012-10-21 NOTE — Lactation Note (Signed)
This note was copied from the chart of Candace Viki Carrera. Lactation Consultation Note     Follow up consult with this mom of a NICU baby,now 47 days old, and 30 5/[redacted] weeks gestation. Mom forgot her pump parts, so I gave her a manual hand pump, and instructed her in it's use.On exam,mom was very full, with soaked breast pads. Mom had not pumped in 5-6 hours. i reviewed with her the importance of pumping every 3 hours, and how if not, she will lose her milk supply and/or get a breast infection.  Mom knows to ask for questions/concerns.  Patient Name: Candace Richardson [id number] Date: 10/21/2012     Maternal Data    Feeding    LATCH Score/Interventions                      Lactation Tools Discussed/Used     Consult Status      Candace Richardson 10/21/2012, 8:54 AM

## 2012-10-22 ENCOUNTER — Ambulatory Visit: Payer: Self-pay

## 2012-10-22 NOTE — Lactation Note (Signed)
This note was copied from the chart of Candace Richardson. Lactation Consultation Note     Brief follow up consult with this mom of a NICU baby, now 7 days post partum, and baby 31 weeks corrected gestation. Mom has now committed to pumping every 3 hours, and is expressing up to 4 -7 ounces each time. i told mom i was very proud of her, and she smilled. She denies any discomfort with her breast, and knows to call for questions/concerns  Patient Name: Candace Richardson [id number] Date: 10/22/2012 Reason for consult: Follow-up assessment;NICU baby   Maternal Data    Feeding Feeding Type: Breast Milk with Formula added Length of feed: 45 min  LATCH Score/Interventions                      Lactation Tools Discussed/Used     Consult Status Consult Status: PRN Follow-up type:  (in NICU)    Alfred Levins 10/22/2012, 3:58 PM

## 2012-10-29 ENCOUNTER — Encounter (HOSPITAL_COMMUNITY): Payer: Self-pay

## 2012-11-10 ENCOUNTER — Ambulatory Visit: Payer: Self-pay

## 2012-11-10 NOTE — Lactation Note (Signed)
This note was copied from the chart of Candace Rayann Jolley. Lactation Consultation Note     Follow up consult with thsi mom of a NICU baby. She is concerned about her now low milk supply, after not pumping for at least 48 hours. i explained that she has stopped making milk due to not pumping. i advised her to stay hydrated, to pump every 2-3 hours around the closkc, for a minimum of 15 minutes, and she may be able to increase her supply. Mom knows to find me for more questions/concerns  Patient Name: Candace Richardson [id number] Date: 11/10/2012 Reason for consult: Follow-up assessment;NICU baby   Maternal Data    Feeding Feeding Type: Breast Milk with Formula added Length of feed: 30 min  LATCH Score/Interventions                      Lactation Tools Discussed/Used     Consult Status Consult Status: PRN Follow-up type:  (in NICU)    Alfred Levins 11/10/2012, 8:57 AM

## 2012-11-12 ENCOUNTER — Encounter: Payer: Self-pay | Admitting: Obstetrics & Gynecology

## 2012-11-12 ENCOUNTER — Ambulatory Visit: Payer: Medicaid Other | Admitting: Obstetrics & Gynecology

## 2012-11-12 VITALS — BP 118/75 | HR 56 | Temp 98.7°F | Wt 202.0 lb

## 2012-11-12 DIAGNOSIS — Z113 Encounter for screening for infections with a predominantly sexual mode of transmission: Secondary | ICD-10-CM

## 2012-11-12 DIAGNOSIS — Z3202 Encounter for pregnancy test, result negative: Secondary | ICD-10-CM

## 2012-11-12 LAB — POCT URINE PREGNANCY: Preg Test, Ur: NEGATIVE

## 2012-11-12 NOTE — Progress Notes (Unsigned)
Subjective:     Candace Richardson is a 22 y.o. female who presents for a postpartum visit. She is 4 weeks postpartum following a spontaneous vaginal delivery. I have fully reviewed the prenatal and intrapartum course. The delivery was at 30 gestational weeks. Outcome: spontaneous vaginal delivery. Anesthesia: none. Postpartum course has been WNL. Baby's course has been WNL Baby is feeding by breast. Bleeding no bleeding. Bowel function is normal. Bladder function is normal. Patient is sexually active. Contraception method is none. Postpartum depression screening: negative.  The following portions of the patient's history were reviewed and updated as appropriate: allergies, current medications, past family history, past social history, past surgical history and problem list.  Review of Systems {ros; complete:30496}   Objective:    BP 118/75  Pulse 56  Temp(Src) 98.7 F (37.1 C)  Wt 202 lb (91.627 kg)  BMI 27.39 kg/m2  Breastfeeding? Yes  General:  {gen appearance:16600}   Breasts:  {breast exam:1202::"inspection negative, no nipple discharge or bleeding, no masses or nodularity palpable"}  Lungs: {lung exam:16931}  Heart:  {heart exam:5510}  Abdomen: {abdomen exam:16834}   Vulva:  {labia exam:12198}  Vagina: {vagina exam:12200}  Cervix:  {cervix exam:14595}  Corpus: {uterus exam:12215}  Adnexa:  {adnexa exam:12223}  Rectal Exam: {rectal/vaginal exam:12274}        Assessment:    *** postpartum exam. Pap smear {done:10129} at today's visit.   Plan:    1. Contraception: {method:5051} 2. *** 3. Follow up in: {1-10:13787} {time; units:19136} or as needed.

## 2012-11-26 ENCOUNTER — Encounter: Payer: Self-pay | Admitting: Obstetrics & Gynecology

## 2012-11-26 ENCOUNTER — Ambulatory Visit (INDEPENDENT_AMBULATORY_CARE_PROVIDER_SITE_OTHER): Payer: Medicaid Other | Admitting: Obstetrics & Gynecology

## 2012-11-26 ENCOUNTER — Encounter: Payer: Self-pay | Admitting: Obstetrics

## 2012-11-26 VITALS — BP 144/77 | HR 60 | Temp 98.8°F | Ht 72.0 in | Wt 203.0 lb

## 2012-11-26 DIAGNOSIS — Z30430 Encounter for insertion of intrauterine contraceptive device: Secondary | ICD-10-CM

## 2012-11-26 DIAGNOSIS — Z3202 Encounter for pregnancy test, result negative: Secondary | ICD-10-CM

## 2012-11-26 DIAGNOSIS — IMO0001 Reserved for inherently not codable concepts without codable children: Secondary | ICD-10-CM

## 2012-11-26 LAB — POCT URINE PREGNANCY: Preg Test, Ur: NEGATIVE

## 2012-11-26 NOTE — Progress Notes (Signed)
Subjective:     Candace Richardson is a 22 y.o. female who presents for a postpartum visit. She is 6 weeks postpartum following a spontaneous vaginal delivery. I have fully reviewed the prenatal and intrapartum course. The delivery was at 30 gestational weeks. Outcome: spontaneous vaginal delivery. Anesthesia: none. Postpartum course has been normal. Baby's course has been normal. Baby is feeding by bottle - Similac Neosure. Bleeding thin lochia. Bowel function is normal. Bladder function is normal. Patient is not sexually active. Contraception method is none. Postpartum depression screening: negative.  The following portions of the patient's history were reviewed and updated as appropriate: allergies, current medications, past family history, past medical history, past social history, past surgical history and problem list.  Review of Systems Pertinent items are noted in HPI.   Objective:    BP 144/77  Pulse 60  Temp(Src) 98.8 F (37.1 C)  Ht 6' (1.829 m)  Wt 203 lb (92.08 kg)  BMI 27.53 kg/m2  Breastfeeding? No       General:  alert     Abdomen: soft, non-tender; bowel sounds normal; no masses,  no organomegaly   Vulva:  normal  Vagina: normal vagina  Cervix:  no lesions  Corpus: normal size, contour, position, consistency, mobility, non-tender  Adnexa:  normal adnexa    Assessment:     Normal postpartum exam.  Plan:    Contraception: IUD--see insertion note below  IUD Insertion Procedure Note  Indications: contraception  Procedure Details  Urine pregnancy test was done and result was negative.  The risks (including infection, bleeding, pain, and uterine perforation) and benefits of the procedure were explained to the patient and Verbal informed consent was obtained.    Cervix cleansed with Betadine. Uterus sounded to 7 cm. IUD inserted without difficulty. String visible and trimmed. Patient tolerated procedure well.  IUD Information: A Skyla IUD was  placed  Condition: Stable  Complications: None  Plan:  The patient was advised to call for any fever or for prolonged or severe pain or bleeding. She was advised to use OTC analgesics as needed for mild to moderate pain.

## 2012-11-27 ENCOUNTER — Encounter: Payer: Self-pay | Admitting: Obstetrics & Gynecology

## 2012-11-27 MED ORDER — LEVONORGESTREL 13.5 MG IU IUD: 13.5000 mg | INTRAUTERINE_SYSTEM | Freq: Once | INTRAUTERINE | Status: DC

## 2012-11-27 NOTE — Patient Instructions (Signed)
Intrauterine Device Insertion Care After Refer to this sheet in the next few weeks. These instructions provide you with information on caring for yourself after your procedure. Your caregiver may also give you more specific instructions. Your treatment has been planned according to current medical practices, but problems sometimes occur. Call your caregiver if you have any problems or questions after your procedure. HOME CARE INSTRUCTIONS   Only take over-the-counter or prescription medicines for pain, discomfort, or fever as directed by your caregiver. Do not use aspirin. This may increase bleeding.  Check your IUD to make sure it is in place before you resume sexual activity. You should be able to feel the strings. If you cannot feel the strings, something may be wrong. The IUD may have fallen out of the uterus, or the uterus may have been punctured (perforated) during placement. Also, if the strings are getting longer, it may mean that the IUD is being forced out of the uterus. You no longer have full protection from pregnancy if any of these problems occur.  You may resume sexual intercourse if you are not having problems with the IUD. The IUD is considered immediately effective.  You may resume normal activities.  Keep all follow-up appointments to be sure your IUD has remained in place. After the first exam, yearly exams are advised, unless you cannot feel the strings of your IUD.  Continue to check that the IUD is still in place by feeling for the strings after every menstrual period. SEEK MEDICAL CARE IF:   You have bleeding that is heavier or lasts longer than a normal menstrual cycle.  You have a fever.  You have increasing cramps or abdominal pain not relieved with medicine.  You have abdominal pain that does not seem to be related to the same area of earlier cramping and pain.  You are lightheaded, unusually weak, or faint.  You have abnormal vaginal discharge or  smells.  You have pain during sexual intercourse.  You cannot feel the IUD strings, or the IUD string has gotten longer.  You feel the IUD at the opening of the cervix in the vagina.  You think you are pregnant, or you miss your menstrual period.  The IUD string is hurting your sex partner. Document Released: 10/24/2010 Document Revised: 05/20/2011 Document Reviewed: 10/24/2010 [employer] Patient Information 2014 [employer].  

## 2012-12-22 ENCOUNTER — Encounter: Payer: Self-pay | Admitting: Obstetrics

## 2013-01-07 ENCOUNTER — Ambulatory Visit: Payer: Medicaid Other | Admitting: Obstetrics & Gynecology

## 2013-01-18 ENCOUNTER — Ambulatory Visit: Payer: Medicaid Other | Admitting: Obstetrics

## 2013-01-21 ENCOUNTER — Encounter: Payer: Self-pay | Admitting: Obstetrics

## 2013-01-21 ENCOUNTER — Ambulatory Visit (INDEPENDENT_AMBULATORY_CARE_PROVIDER_SITE_OTHER): Payer: Medicaid Other | Admitting: Obstetrics

## 2013-01-21 ENCOUNTER — Ambulatory Visit: Payer: Medicaid Other | Admitting: Obstetrics

## 2013-01-21 VITALS — BP 133/73 | HR 78 | Temp 98.3°F | Ht 72.0 in | Wt 211.0 lb

## 2013-01-21 DIAGNOSIS — Z30431 Encounter for routine checking of intrauterine contraceptive device: Secondary | ICD-10-CM

## 2013-01-21 NOTE — Progress Notes (Unsigned)
Patient ID: Candace Richardson, female   DOB: 1990-07-16, 22 y.o.   MRN: [id number] Pt here for 3 month post partum check up, depression screening completed with score of 16 (moderate to severe) reports dx of bipolar PTSD and depression, depression was an existing dx prior to pregnancy, currently being seen at [facility] for psychiatric care, reports she is still have  dark red discharge.

## 2013-01-21 NOTE — Progress Notes (Unsigned)
Subjective:     Candace Richardson is a 22 y.o. female who presents for a postpartum visit. She is 3 months postpartum following a spontaneous vaginal delivery. I have fully reviewed the prenatal and intrapartum course. The delivery was at 30 gestational weeks. Outcome: spontaneous vaginal delivery. Anesthesia: none. Postpartum course has been normal. Baby's course has been normal. Baby is feeding by bottle - [employer] Good Start DHA and ARA. Bleeding staining only, red and moderate dark red. Bowel function is normal. Bladder function is normal. Patient is sexually active. Contraception method is IUD. Postpartum depression screening: positive score of 16.  {Common ambulatory SmartLinks:19316}  Review of Systems {ros; complete:30496}   Objective:    BP 133/73  Pulse 78  Temp(Src) 98.3 F (36.8 C)  Ht 6' (1.829 m)  Wt 211 lb (95.709 kg)  BMI 28.61 kg/m2  Breastfeeding? No  General:  {gen appearance:16600}   Breasts:  {breast exam:1202::"inspection negative, no nipple discharge or bleeding, no masses or nodularity palpable"}  Lungs: {lung exam:16931}  Heart:  {heart exam:5510}  Abdomen: {abdomen exam:16834}   Vulva:  {labia exam:12198}  Vagina: {vagina exam:12200}  Cervix:  {cervix exam:14595}  Corpus: {uterus exam:12215}  Adnexa:  {adnexa exam:12223}  Rectal Exam: {rectal/vaginal exam:12274}        Assessment:    *** postpartum exam. Pap smear {done:10129} at today's visit.   Plan:    1. Contraception: {method:5051} 2. *** 3. Follow up in: {1-10:13787} {time; units:19136} or as needed.

## 2013-01-26 ENCOUNTER — Encounter: Payer: Self-pay | Admitting: Obstetrics

## 2013-02-03 ENCOUNTER — Encounter: Payer: Self-pay | Admitting: Obstetrics

## 2013-02-03 NOTE — Progress Notes (Signed)
Subjective:     Patient ID: Candace Richardson, female   DOB: [birth year], 22 y.o.   MRN: [id number]  HPI :  22 yo, presents for IUD surveillance.  Had Skylar IUD placed 11-21-12.   Review of Systems:  Unremarkable     Objective:   Physical Exam:  NEFG.  Vagina normal.  Cervix normal and IUD string visible from os.                                Uterus NSSC, NT.  Adnexa n.egative.     Assessment:     IUD surveillance.  Doing well.     Plan:     Return for Annual.

## 2013-04-09 ENCOUNTER — Ambulatory Visit: Payer: Medicaid Other

## 2013-05-05 IMAGING — US US OB TRANSVAGINAL
1 series · 14 of 28 positions shown · non-contrast
Comparison: 04/18/2012

CLINICAL DATA: Lower abdominal pain.  Early pregnancy.

OBSTETRIC <14 WK US AND TRANSVAGINAL OB US
TECHNIQUE: Both transabdominal and transvaginal ultrasound
examinations were performed for complete evaluation of the
gestation as well as the maternal uterus, adnexal regions, and
pelvic cul-de-sac.

[Series 1: us ob transvaginal · 0.23mm/px · 14 of 67 slices shown]
[im 3/67]
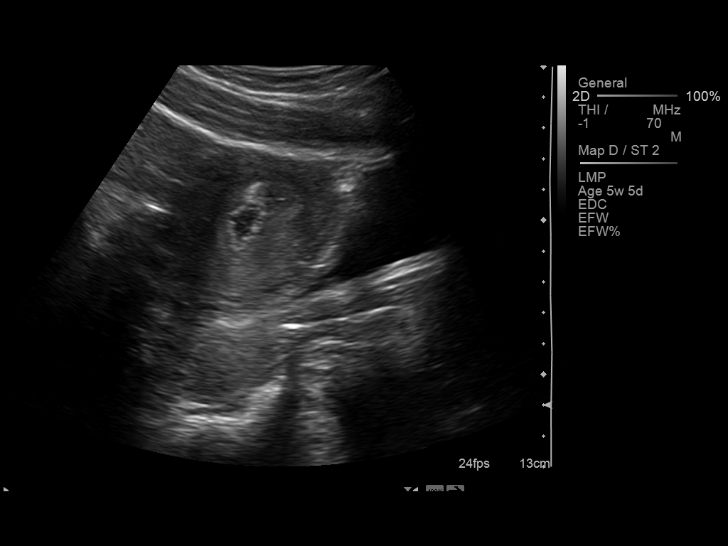
[im 8/67]
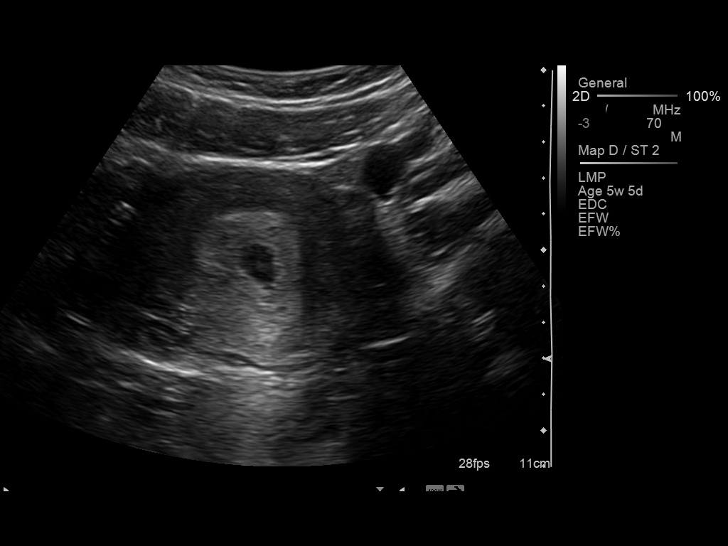
[im 13/67]
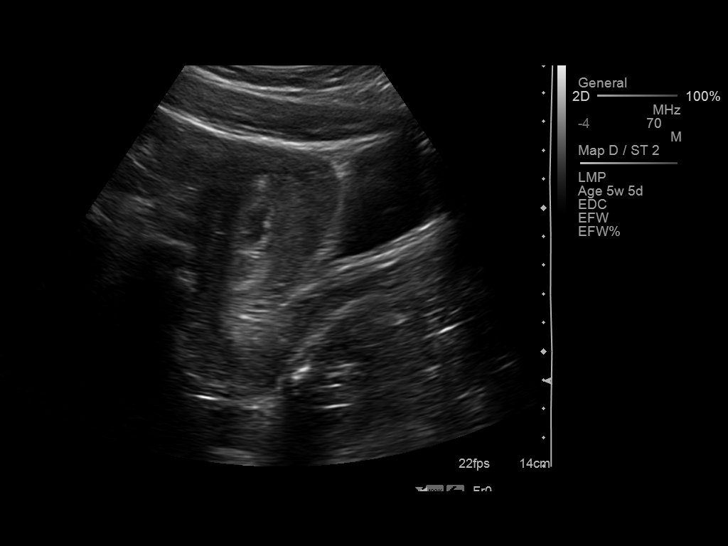
[im 18/67]
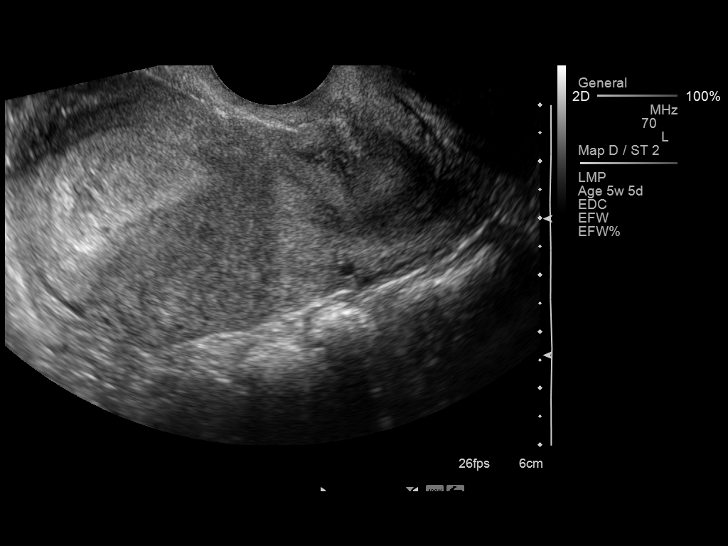
[im 23/67]
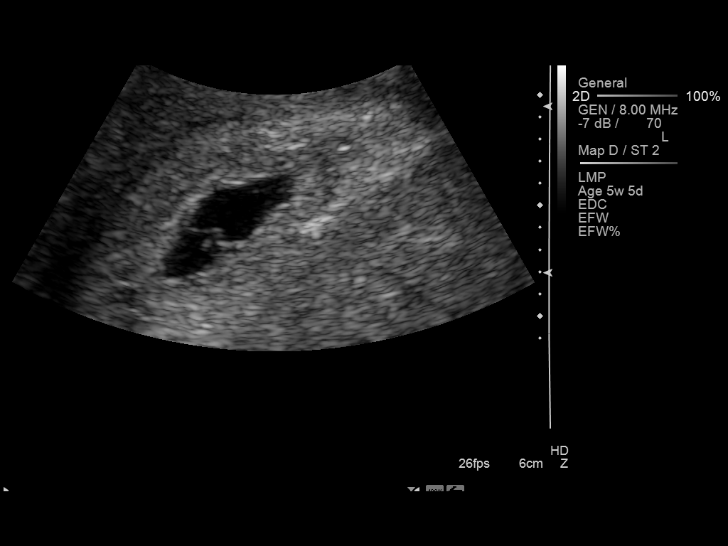
[im 27/67]
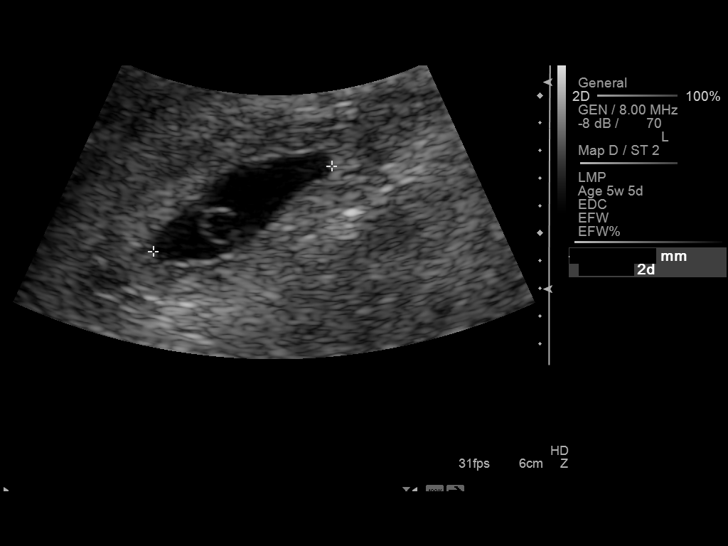
[im 32/67]
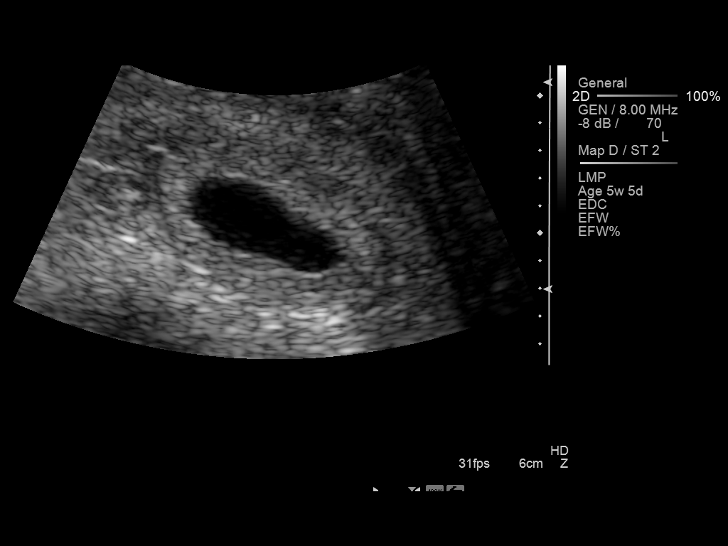
[im 37/67]
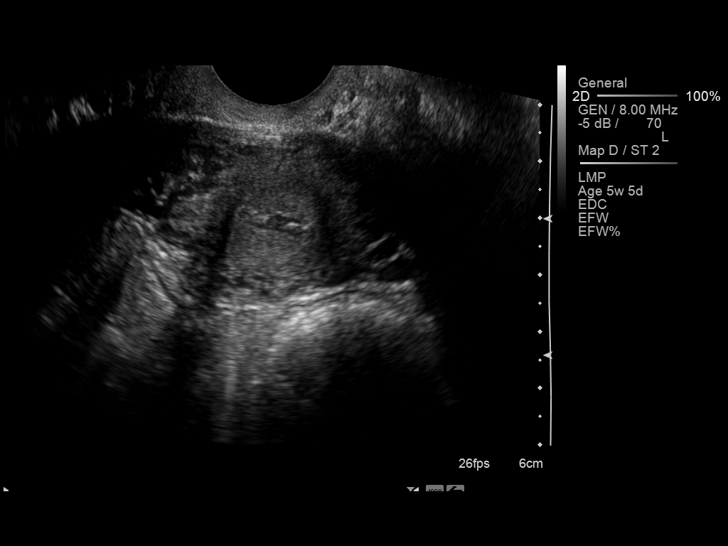
[im 42/67]
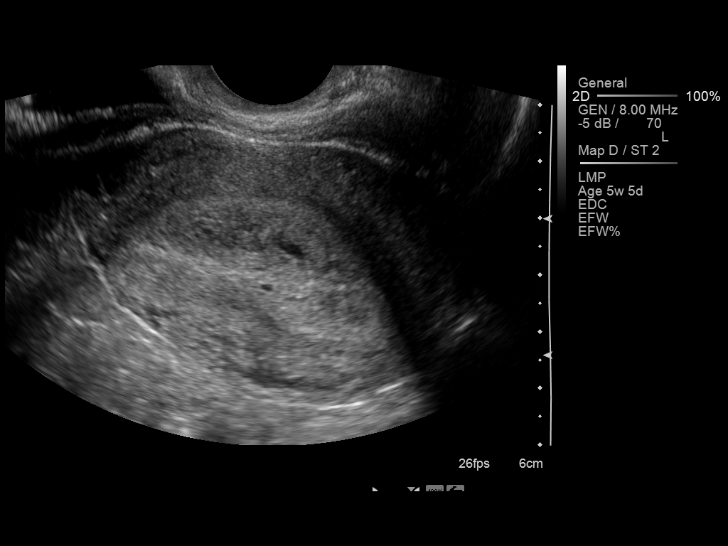
[im 47/67]
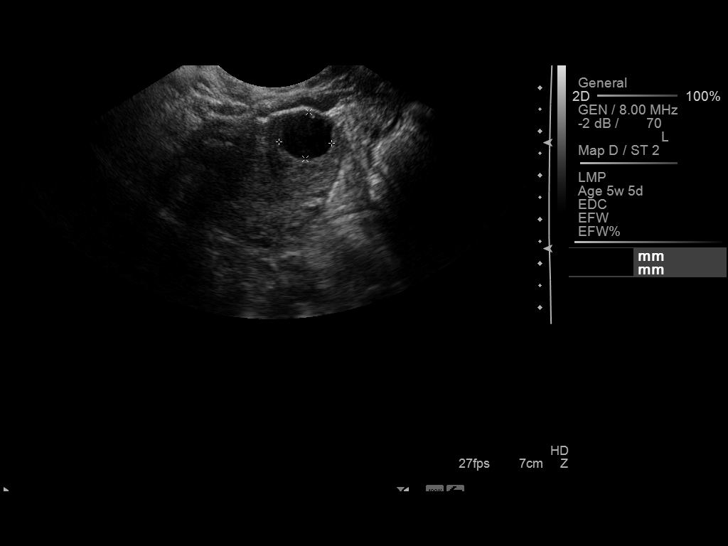
[im 52/67]
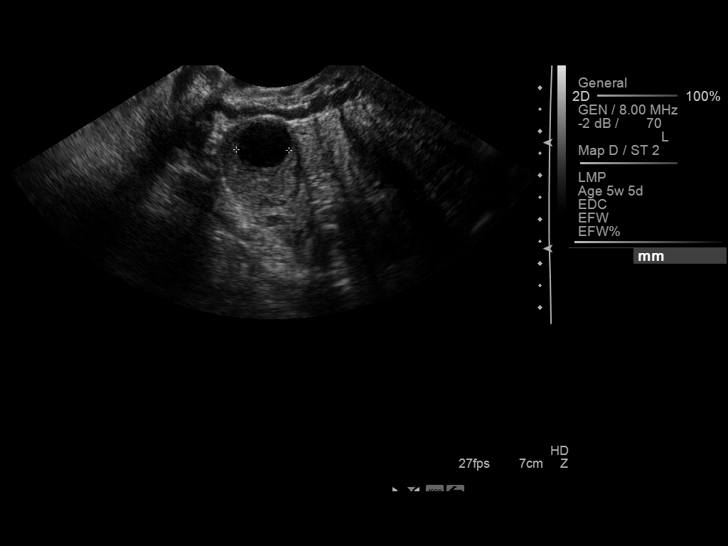
[im 57/67]
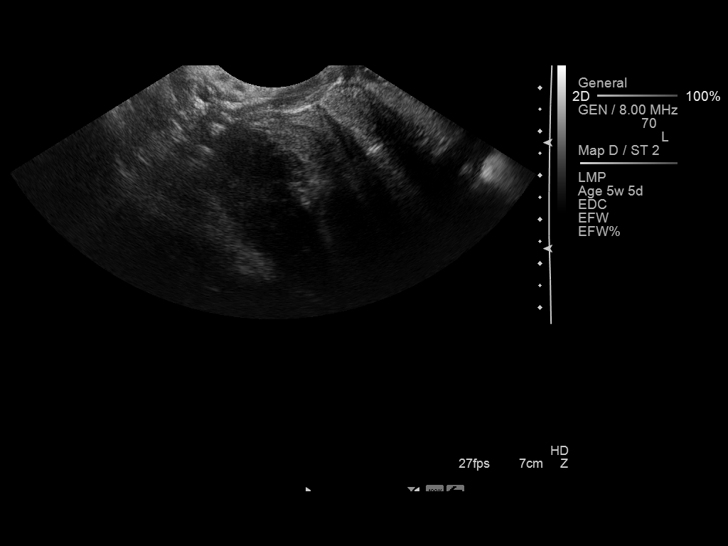
[im 62/67]
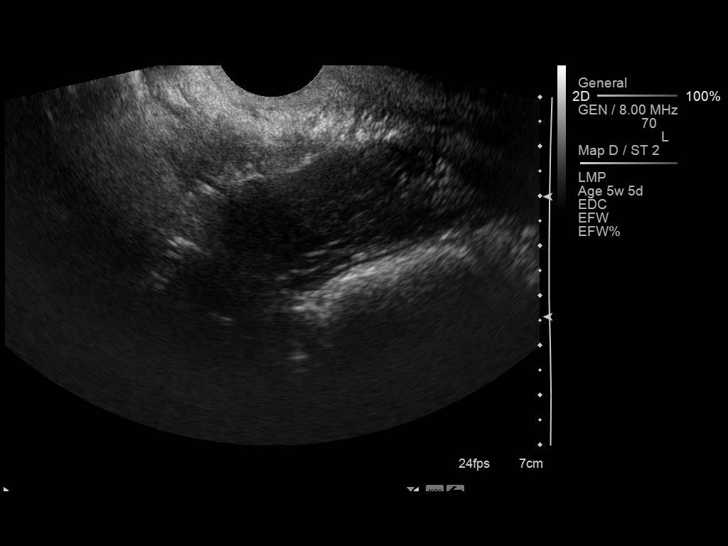
[im 67/67]
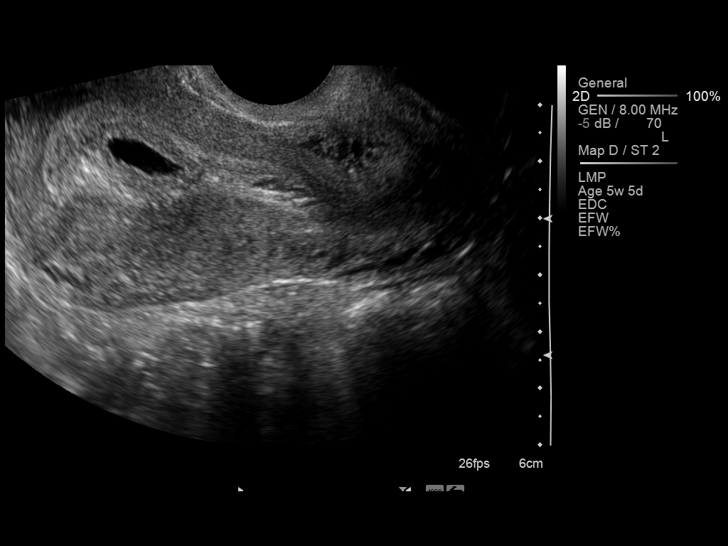

[14 of 28 positions shown; findings below may reference images not displayed]

FINDINGS: Single intrauterine gestational sac noted with yolk sac
visible.  Embryo not yet well seen.  Mean sac diameter 8 mm
compatible with 5 weeks 4 days gestation.  This is congruent with
gestational age by first ultrasound and last menstrual period.

No subchorionic hemorrhage.  Right ovarian corpus luteum
unremarkable.  Left ovary not well seen.  Trace free pelvic fluid
noted.
IMPRESSION: 1.  Intrauterine gestational sac measuring at 5 weeks 4 days
gestation, with yolk sac visible but the embryo not yet well seen.
This is probably due to early phase of pregnancy.  Correlation with
quantitative beta HCG trend suggested.
2.  Trace amount of free pelvic fluid.
3.  The left ovary was not well seen on today's exam.

## 2013-05-28 ENCOUNTER — Inpatient Hospital Stay (HOSPITAL_COMMUNITY)
Admission: AD | Admit: 2013-05-28 | Discharge: 2013-05-28 | Disposition: A | Discharge status: Home / Self Care | Payer: Medicaid Other | Source: Ambulatory Visit | Attending: Obstetrics & Gynecology | Admitting: Obstetrics & Gynecology

## 2013-05-28 ENCOUNTER — Encounter (HOSPITAL_COMMUNITY): Payer: Self-pay | Admitting: *Deleted

## 2013-05-28 DIAGNOSIS — Z87891 Personal history of nicotine dependence: Secondary | ICD-10-CM | POA: Insufficient documentation

## 2013-05-28 DIAGNOSIS — A5901 Trichomonal vulvovaginitis: Secondary | ICD-10-CM

## 2013-05-28 DIAGNOSIS — N898 Other specified noninflammatory disorders of vagina: Secondary | ICD-10-CM

## 2013-05-28 DIAGNOSIS — Z30431 Encounter for routine checking of intrauterine contraceptive device: Secondary | ICD-10-CM | POA: Insufficient documentation

## 2013-05-28 LAB — WET PREP, GENITAL
Clue Cells Wet Prep HPF POC: NONE SEEN
Yeast Wet Prep HPF POC: NONE SEEN

## 2013-05-28 LAB — URINALYSIS, ROUTINE W REFLEX MICROSCOPIC
Bilirubin Urine: NEGATIVE
Glucose, UA: NEGATIVE mg/dL
Hgb urine dipstick: NEGATIVE
Ketones, ur: NEGATIVE mg/dL
Nitrite: NEGATIVE
PROTEIN: NEGATIVE mg/dL
Specific Gravity, Urine: 1.025 (ref 1.005–1.030)
UROBILINOGEN UA: 0.2 mg/dL (ref 0.0–1.0)
pH: 6 (ref 5.0–8.0)

## 2013-05-28 LAB — CBC
HCT: 40.5 % (ref 36.0–46.0)
Hemoglobin: 14.1 g/dL (ref 12.0–15.0)
MCH: 29.3 pg (ref 26.0–34.0)
MCHC: 34.8 g/dL (ref 30.0–36.0)
MCV: 84.2 fL (ref 78.0–100.0)
PLATELETS: 171 10*3/uL (ref 150–400)
RBC: 4.81 MIL/uL (ref 3.87–5.11)
RDW: 14.3 % (ref 11.5–15.5)
WBC: 8.2 10*3/uL (ref 4.0–10.5)

## 2013-05-28 LAB — URINE MICROSCOPIC-ADD ON

## 2013-05-28 LAB — POCT PREGNANCY, URINE: Preg Test, Ur: NEGATIVE

## 2013-05-28 MED ORDER — METRONIDAZOLE 500 MG PO TABS: 500.0000 mg | ORAL_TABLET | Freq: Two times a day (BID) | ORAL | Status: DC

## 2013-05-28 NOTE — MAU Provider Note (Signed)
History     CSN: [id number]  Arrival date and time: 05/28/13 1551   First Provider Initiated Contact with Patient 05/28/13 1617      Chief Complaint  Patient presents with  . Possible Pregnancy  . Vaginal Discharge   HPI Candace Richardson is 23 y.o. G1P0101 Unknown weeks presenting with vaginal odor and missed period X 2 cycles.  Vaginal odor began 2 weeks ago.  She denies vaginal discharge.  She has a Mirena IUD placed in 4-5 months ago, having had a monthly cycle.  LMP 04/11/13.  She has one sexual partner X 102yrs but is concerned he is not faithful.   She is a patient of Dr. Marcia Brash.  She did not call the office.    Past Medical History  Diagnosis Date  . Polycystic ovary disease   . Depression     Past Surgical History  Procedure Laterality Date  . Ear examination under anesthesia      Family History  Problem Relation Age of Onset  . Heart disease Maternal Uncle   . Diabetes Maternal Grandmother     History  Substance Use Topics  . Smoking status: Former Smoker    Quit date: 04/18/2012  . Smokeless tobacco: Never Used  . Alcohol Use: No    Allergies:  Allergies  Allergen Reactions  . Penicillins Hives    Rocephin w/out reaction.    Prescriptions prior to admission  Medication Sig Dispense Refill  . Levonorgestrel (SKYLA) 13.5 MG IUD 13.5 mg by Intrauterine route once.        Review of Systems  Constitutional: Negative for fever and chills.  Gastrointestinal: Positive for abdominal pain (cramping).  Genitourinary:       + for vaginal odor Neg for discharge. Missed period.   Physical Exam   Blood pressure 144/74, pulse 80, temperature 98.4 F (36.9 C), temperature source Oral, resp. rate 18, height 5\' 9"  (1.753 m), weight 229 lb (103.874 kg), last menstrual period 04/11/2013, not currently breastfeeding.  Physical Exam  Constitutional: She is oriented to person, place, and time. She appears well-developed and well-nourished. No distress.  HENT:   Head: Normocephalic.  Neck: Normal range of motion.  Cardiovascular: Normal rate.   Respiratory: Effort normal.  GI: Soft. She exhibits no distension and no mass. There is tenderness (mild diffuse ). There is no rebound and no guarding.  Genitourinary: There is no rash, tenderness or lesion on the right labia. There is no rash, tenderness or lesion on the left labia. Uterus is not enlarged and not tender. Cervix exhibits no motion tenderness, no discharge and no friability. Right adnexum displays no mass, no tenderness and no fullness. Left adnexum displays no mass, no tenderness and no fullness. There is erythema around the vagina. No tenderness or bleeding around the vagina. Vaginal discharge (scant discharge with odor) found.  Vaginal mucosa and cervix erythematous  Neurological: She is alert and oriented to person, place, and time.  Skin: Skin is warm and dry.  Psychiatric: She has a normal mood and affect. Her behavior is normal.   Results for orders placed during the hospital encounter of 05/28/13 (from the past 24 hour(s))  URINALYSIS, ROUTINE W REFLEX MICROSCOPIC     Status: Abnormal   Collection Time    05/28/13  4:00 PM      Result Value Ref Range   Color, Urine YELLOW  YELLOW   APPearance CLEAR  CLEAR   Specific Gravity, Urine 1.025  1.005 - 1.030  pH 6.0  5.0 - 8.0   Glucose, UA NEGATIVE  NEGATIVE mg/dL   Hgb urine dipstick NEGATIVE  NEGATIVE   Bilirubin Urine NEGATIVE  NEGATIVE   Ketones, ur NEGATIVE  NEGATIVE mg/dL   Protein, ur NEGATIVE  NEGATIVE mg/dL   Urobilinogen, UA 0.2  0.0 - 1.0 mg/dL   Nitrite NEGATIVE  NEGATIVE   Leukocytes, UA TRACE (*) NEGATIVE  URINE MICROSCOPIC-ADD ON     Status: Abnormal   Collection Time    05/28/13  4:00 PM      Result Value Ref Range   Squamous Epithelial / LPF FEW (*) RARE   WBC, UA 0-2  <3 WBC/hpf   Urine-Other TRICHOMONAS PRESENT    POCT PREGNANCY, URINE     Status: None   Collection Time    05/28/13  4:08 PM      Result  Value Ref Range   Preg Test, Ur NEGATIVE  NEGATIVE  WET PREP, GENITAL     Status: Abnormal   Collection Time    05/28/13  4:30 PM      Result Value Ref Range   Yeast Wet Prep HPF POC NONE SEEN  NONE SEEN   Trich, Wet Prep FEW (*) NONE SEEN   Clue Cells Wet Prep HPF POC NONE SEEN  NONE SEEN   WBC, Wet Prep HPF POC FEW (*) NONE SEEN  CBC     Status: None   Collection Time    05/28/13  4:40 PM      Result Value Ref Range   WBC 8.2  4.0 - 10.5 K/uL   RBC 4.81  3.87 - 5.11 MIL/uL   Hemoglobin 14.1  12.0 - 15.0 g/dL   HCT 62.140.5  30.836.0 - 65.746.0 %   MCV 84.2  78.0 - 100.0 fL   MCH 29.3  26.0 - 34.0 pg   MCHC 34.8  30.0 - 36.0 g/dL   RDW 84.614.3  96.211.5 - 95.215.5 %   Platelets 171  150 - 400 K/uL   MAU Course  Procedures  GC/CHL culture to lab  MDM Discussed lab findings and STI-need for partner to be treated. Assessment and Plan  A:  Trichomonal vaginitis      IUD contraception  P:  Patient instructed to call Dr. Marcia BrashJackson-Moore's office on Monday for culture results     Rx for Flagyl 500mg   Bid X 1 week     Patient instructed to have partner treated before resuming intercourse.   KEY,EVE M 05/28/2013, 5:15 PM

## 2013-05-28 NOTE — Discharge Instructions (Signed)
Trichomoniasis °Trichomoniasis is an infection, caused by the Trichomonas organism, that affects both women and men. In women, the outer female genitalia and the vagina are affected. In men, the penis is mainly affected, but the prostate and other reproductive organs can also be involved. Trichomoniasis is a sexually transmitted disease (STD) and is most often passed to another person through sexual contact. The majority of people who get trichomoniasis do so from a sexual encounter and are also at risk for other STDs. °CAUSES  °· Sexual intercourse with an infected partner. °· It can be present in swimming pools or hot tubs. °SYMPTOMS  °· Abnormal gray-green frothy vaginal discharge in women. °· Vaginal itching and irritation in women. °· Itching and irritation of the area outside the vagina in women. °· Penile discharge with or without pain in males. °· Inflammation of the urethra (urethritis), causing painful urination. °· Bleeding after sexual intercourse. °RELATED COMPLICATIONS °· Pelvic inflammatory disease. °· Infection of the uterus (endometritis). °· Infertility. °· Tubal (ectopic) pregnancy. °· It can be associated with other STDs, including gonorrhea and chlamydia, hepatitis B, and HIV. °COMPLICATIONS DURING PREGNANCY °· Early (premature) delivery. °· Premature rupture of the membranes (PROM). °· Low birth weight. °DIAGNOSIS  °· Visualization of Trichomonas under the microscope from the vagina discharge. °· Ph of the vagina greater than 4.5, tested with a test tape. °· Trich Rapid Test. °· Culture of the organism, but this is not usually needed. °· It may be found on a Pap test. °· Having a "strawberry cervix,"which means the cervix looks very red like a strawberry. °TREATMENT  °· You may be given medication to fight the infection. Inform your caregiver if you could be or are pregnant. Some medications used to treat the infection should not be taken during pregnancy. °· Over-the-counter medications or  creams to decrease itching or irritation may be recommended. °· Your sexual partner will need to be treated if infected. °HOME CARE INSTRUCTIONS  °· Take all medication prescribed by your caregiver. °· Take over-the-counter medication for itching or irritation as directed by your caregiver. °· Do not have sexual intercourse while you have the infection. °· Do not douche or wear tampons. °· Discuss your infection with your partner, as your partner may have acquired the infection from you. Or, your partner may have been the person who transmitted the infection to you. °· Have your sex partner examined and treated if necessary. °· Practice safe, informed, and protected sex. °· See your caregiver for other STD testing. °SEEK MEDICAL CARE IF:  °· You still have symptoms after you finish the medication. °· You have an oral temperature above 102° F (38.9° C). °· You develop belly (abdominal) pain. °· You have pain when you urinate. °· You have bleeding after sexual intercourse. °· You develop a rash. °· The medication makes you sick or makes you throw up (vomit). °Document Released: 08/21/2000 Document Revised: 05/20/2011 Document Reviewed: 09/16/2008 °[employer]® Patient Information ©2014 [employer]. ° °

## 2013-05-28 NOTE — MAU Note (Signed)
Patient states she has an IUD and has not had a period in 2 months. Has a vaginal discharge with odor and wants STI testing.

## 2013-05-29 LAB — GC/CHLAMYDIA PROBE AMP
CT PROBE, AMP APTIMA: NEGATIVE
GC Probe RNA: NEGATIVE

## 2013-05-31 ENCOUNTER — Encounter (HOSPITAL_COMMUNITY): Payer: Self-pay | Admitting: Emergency Medicine

## 2013-05-31 ENCOUNTER — Telehealth: Payer: Self-pay | Admitting: Obstetrics & Gynecology

## 2013-05-31 ENCOUNTER — Emergency Department (HOSPITAL_COMMUNITY)
Admission: EM | Admit: 2013-05-31 | Discharge: 2013-05-31 | Disposition: A | Discharge status: Home / Self Care | Payer: Medicaid Other | Attending: Emergency Medicine | Admitting: Emergency Medicine

## 2013-05-31 DIAGNOSIS — R110 Nausea: Secondary | ICD-10-CM

## 2013-05-31 DIAGNOSIS — G8929 Other chronic pain: Secondary | ICD-10-CM | POA: Insufficient documentation

## 2013-05-31 DIAGNOSIS — Z8639 Personal history of other endocrine, nutritional and metabolic disease: Secondary | ICD-10-CM | POA: Insufficient documentation

## 2013-05-31 DIAGNOSIS — Z79899 Other long term (current) drug therapy: Secondary | ICD-10-CM | POA: Insufficient documentation

## 2013-05-31 DIAGNOSIS — Z3202 Encounter for pregnancy test, result negative: Secondary | ICD-10-CM | POA: Insufficient documentation

## 2013-05-31 DIAGNOSIS — N898 Other specified noninflammatory disorders of vagina: Secondary | ICD-10-CM | POA: Insufficient documentation

## 2013-05-31 DIAGNOSIS — Z862 Personal history of diseases of the blood and blood-forming organs and certain disorders involving the immune mechanism: Secondary | ICD-10-CM | POA: Insufficient documentation

## 2013-05-31 DIAGNOSIS — R109 Unspecified abdominal pain: Secondary | ICD-10-CM | POA: Insufficient documentation

## 2013-05-31 DIAGNOSIS — Z880 Allergy status to penicillin: Secondary | ICD-10-CM | POA: Insufficient documentation

## 2013-05-31 DIAGNOSIS — Z8659 Personal history of other mental and behavioral disorders: Secondary | ICD-10-CM | POA: Insufficient documentation

## 2013-05-31 LAB — COMPREHENSIVE METABOLIC PANEL
ALBUMIN: 3.5 g/dL (ref 3.5–5.2)
ALK PHOS: 58 U/L (ref 39–117)
ALT: 10 U/L (ref 0–35)
AST: 14 U/L (ref 0–37)
BILIRUBIN TOTAL: 0.3 mg/dL (ref 0.3–1.2)
BUN: 10 mg/dL (ref 6–23)
CHLORIDE: 105 meq/L (ref 96–112)
CO2: 27 mEq/L (ref 19–32)
CREATININE: 0.69 mg/dL (ref 0.50–1.10)
Calcium: 8.8 mg/dL (ref 8.4–10.5)
GFR calc Af Amer: >90 mL/min (ref >90)
GFR calc non Af Amer: >90 mL/min (ref >90)
Glucose, Bld: 99 mg/dL (ref 70–99)
Potassium: 3.9 mEq/L (ref 3.7–5.3)
Sodium: 142 mEq/L (ref 137–147)
TOTAL PROTEIN: 7.1 g/dL (ref 6.0–8.3)

## 2013-05-31 LAB — CBC WITH DIFFERENTIAL/PLATELET
BASOS PCT: 0 % (ref 0–1)
Basophils Absolute: 0 10*3/uL (ref 0.0–0.1)
EOS ABS: 0.2 10*3/uL (ref 0.0–0.7)
Eosinophils Relative: 2 % (ref 0–5)
HEMATOCRIT: 38.9 % (ref 36.0–46.0)
HEMOGLOBIN: 13.4 g/dL (ref 12.0–15.0)
LYMPHS ABS: 3.1 10*3/uL (ref 0.7–4.0)
Lymphocytes Relative: 41 % (ref 12–46)
MCH: 28.9 pg (ref 26.0–34.0)
MCHC: 34.4 g/dL (ref 30.0–36.0)
MCV: 84 fL (ref 78.0–100.0)
MONO ABS: 0.3 10*3/uL (ref 0.1–1.0)
Monocytes Relative: 4 % (ref 3–12)
Neutro Abs: 3.9 10*3/uL (ref 1.7–7.7)
Neutrophils Relative %: 52 % (ref 43–77)
Platelets: 185 10*3/uL (ref 150–400)
RBC: 4.63 MIL/uL (ref 3.87–5.11)
RDW: 14.5 % (ref 11.5–15.5)
WBC: 7.5 10*3/uL (ref 4.0–10.5)

## 2013-05-31 LAB — URINALYSIS, ROUTINE W REFLEX MICROSCOPIC
BILIRUBIN URINE: NEGATIVE
Glucose, UA: NEGATIVE mg/dL
HGB URINE DIPSTICK: NEGATIVE
Ketones, ur: NEGATIVE mg/dL
NITRITE: NEGATIVE
PROTEIN: NEGATIVE mg/dL
SPECIFIC GRAVITY, URINE: 1.029 (ref 1.005–1.030)
Urobilinogen, UA: 0.2 mg/dL (ref 0.0–1.0)
pH: 6.5 (ref 5.0–8.0)

## 2013-05-31 LAB — URINE MICROSCOPIC-ADD ON

## 2013-05-31 LAB — LIPASE, BLOOD: LIPASE: 18 U/L (ref 11–59)

## 2013-05-31 LAB — POC URINE PREG, ED: PREG TEST UR: NEGATIVE

## 2013-05-31 MED ORDER — ONDANSETRON 4 MG PO TBDP: 4.0000 mg | ORAL_TABLET | Freq: Once | ORAL | Status: DC

## 2013-05-31 MED ORDER — IBUPROFEN 400 MG PO TABS
400.0000 mg | ORAL_TABLET | Freq: Once | ORAL | Status: AC
  Administered 2013-05-31: 400 mg via ORAL
  Filled 2013-05-31: qty 1

## 2013-05-31 MED ORDER — ONDANSETRON 4 MG PO TBDP
4.0000 mg | ORAL_TABLET | Freq: Once | ORAL | Status: AC
  Administered 2013-05-31: 4 mg via ORAL
  Filled 2013-05-31: qty 1

## 2013-05-31 NOTE — ED Notes (Signed)
Complains of right lower abd pain, onset last pm, sts constipation, concerned about her appendix, hx of trichomonas and yeast infection recently untreated, unsure of how long she has had it.

## 2013-05-31 NOTE — Telephone Encounter (Signed)
Patient called requesting lab results from a visit to the ER. She states she was advised to call the office for her results. Patient states pharmacy is correct on file. Patient results were not reviewed with patient. She was informed she would receive a return phone call regarding her lab results.

## 2013-05-31 NOTE — Discharge Instructions (Signed)
Abdominal Pain, Adult °Many things can cause abdominal pain. Usually, abdominal pain is not caused by a disease and will improve without treatment. It can often be observed and treated at home. Your health care provider will do a physical exam and possibly order blood tests and X-rays to help determine the seriousness of your pain. However, in many cases, more time must pass before a clear cause of the pain can be found. Before that point, your health care provider may not know if you need more testing or further treatment. °HOME CARE INSTRUCTIONS  °Monitor your abdominal pain for any changes. The following actions may help to alleviate any discomfort you are experiencing: °· Only take over-the-counter or prescription medicines as directed by your health care provider. °· Do not take laxatives unless directed to do so by your health care provider. °· Try a clear liquid diet (broth, tea, or water) as directed by your health care provider. Slowly move to a bland diet as tolerated. °SEEK MEDICAL CARE IF: °· You have unexplained abdominal pain. °· You have abdominal pain associated with nausea or diarrhea. °· You have pain when you urinate or have a bowel movement. °· You experience abdominal pain that wakes you in the night. °· You have abdominal pain that is worsened or improved by eating food. °· You have abdominal pain that is worsened with eating fatty foods. °SEEK IMMEDIATE MEDICAL CARE IF:  °· Your pain does not go away within 2 hours. °· You have a fever. °· You keep throwing up (vomiting). °· Your pain is felt only in portions of the abdomen, such as the right side or the left lower portion of the abdomen. °· You pass bloody or black tarry stools. °MAKE SURE YOU: °· Understand these instructions.   °· Will watch your condition.   °· Will get help right away if you are not doing well or get worse.   °Document Released: 12/05/2004 Document Revised: 12/16/2012 Document Reviewed: 11/04/2012 °ExitCare® Patient  Information ©2014 [employer]. ° °Abdominal Pain, Women °Abdominal (stomach, pelvic, or belly) pain can be caused by many things. It is important to tell your doctor: °· The location of the pain. °· Does it come and go or is it present all the time? °· Are there things that start the pain (eating certain foods, exercise)? °· Are there other symptoms associated with the pain (fever, nausea, vomiting, diarrhea)? °All of this is helpful to know when trying to find the cause of the pain. °CAUSES  °· Stomach: virus or bacteria infection, or ulcer. °· Intestine: appendicitis (inflamed appendix), regional ileitis (Crohn's disease), ulcerative colitis (inflamed colon), irritable bowel syndrome, diverticulitis (inflamed diverticulum of the colon), or cancer of the stomach or intestine. °· Gallbladder disease or stones in the gallbladder. °· Kidney disease, kidney stones, or infection. °· Pancreas infection or cancer. °· Fibromyalgia (pain disorder). °· Diseases of the female organs: °· Uterus: fibroid (non-cancerous) tumors or infection. °· Fallopian tubes: infection or tubal pregnancy. °· Ovary: cysts or tumors. °· Pelvic adhesions (scar tissue). °· Endometriosis (uterus lining tissue growing in the pelvis and on the pelvic organs). °· Pelvic congestion syndrome (female organs filling up with blood just before the menstrual period). °· Pain with the menstrual period. °· Pain with ovulation (producing an egg). °· Pain with an IUD (intrauterine device, birth control) in the uterus. °· Cancer of the female organs. °· Functional pain (pain not caused by a disease, may improve without treatment). °· Psychological pain. °· Depression. °DIAGNOSIS  °Your doctor   will decide the seriousness of your pain by doing an examination. °· Blood tests. °· X-rays. °· Ultrasound. °· CT scan (computed tomography, special type of X-ray). °· MRI (magnetic resonance imaging). °· Cultures, for infection. °· Barium enema (dye inserted in the large  intestine, to better view it with X-rays). °· Colonoscopy (looking in intestine with a lighted tube). °· Laparoscopy (minor surgery, looking in abdomen with a lighted tube). °· Major abdominal exploratory surgery (looking in abdomen with a large incision). °TREATMENT  °The treatment will depend on the cause of the pain.  °· Many cases can be observed and treated at home. °· Over-the-counter medicines recommended by your caregiver. °· Prescription medicine. °· Antibiotics, for infection. °· Birth control pills, for painful periods or for ovulation pain. °· Hormone treatment, for endometriosis. °· Nerve blocking injections. °· Physical therapy. °· Antidepressants. °· Counseling with a psychologist or psychiatrist. °· Minor or major surgery. °HOME CARE INSTRUCTIONS  °· Do not take laxatives, unless directed by your caregiver. °· Take over-the-counter pain medicine only if ordered by your caregiver. Do not take aspirin because it can cause an upset stomach or bleeding. °· Try a clear liquid diet (broth or water) as ordered by your caregiver. Slowly move to a bland diet, as tolerated, if the pain is related to the stomach or intestine. °· Have a thermometer and take your temperature several times a day, and record it. °· Bed rest and sleep, if it helps the pain. °· Avoid sexual intercourse, if it causes pain. °· Avoid stressful situations. °· Keep your follow-up appointments and tests, as your caregiver orders. °· If the pain does not go away with medicine or surgery, you may try: °· Acupuncture. °· Relaxation exercises (yoga, meditation). °· Group therapy. °· Counseling. °SEEK MEDICAL CARE IF:  °· You notice certain foods cause stomach pain. °· Your home care treatment is not helping your pain. °· You need stronger pain medicine. °· You want your IUD removed. °· You feel faint or lightheaded. °· You develop nausea and vomiting. °· You develop a rash. °· You are having side effects or an allergy to your medicine. °SEEK  IMMEDIATE MEDICAL CARE IF:  °· Your pain does not go away or gets worse. °· You have a fever. °· Your pain is felt only in portions of the abdomen. The right side could possibly be appendicitis. The left lower portion of the abdomen could be colitis or diverticulitis. °· You are passing blood in your stools (bright red or black tarry stools, with or without vomiting). °· You have blood in your urine. °· You develop chills, with or without a fever. °· You pass out. °MAKE SURE YOU:  °· Understand these instructions. °· Will watch your condition. °· Will get help right away if you are not doing well or get worse. °Document Released: 12/23/2006 Document Revised: 05/20/2011 Document Reviewed: 01/12/2009 °[employer]® Patient Information ©2014 [employer]. ° °

## 2013-05-31 NOTE — ED Provider Notes (Signed)
CSN: [id number]     Arrival date & time 05/31/13  1401 History   First MD Initiated Contact with Patient 05/31/13 1606     Chief Complaint  Patient presents with  . Abdominal Pain    HPI 23 year old female presents complaining of abdominal pain. Onset was 2 months ago. Pain is located throughout her lower abdomen. It is not worse on one side or the other. It is not radiating. Is cramping in nature. Is mild to moderate in severity. Aggravated by nothing. Relieved by nothing. She has not tried any over-the-counter analgesics or other treatments. She is worried about her appendix. Her symptoms are constant.  She was recently diagnosed with trichomonas. She is undergoing appropriate therapy for this with Flagyl. She was reevaluated for this today at the [facility]. She had a pelvic exam at that time. She's worried that her abdominal pain could be related to her trichomonas. She is having scant vaginal discharge. No vaginal bleeding. No dysuria, urinary frequency, urinary urgency, nausea, vomiting, fever, or other symptoms.  Past Medical History  Diagnosis Date  . Polycystic ovary disease   . Depression    Past Surgical History  Procedure Laterality Date  . Ear examination under anesthesia     Family History  Problem Relation Age of Onset  . Heart disease Maternal Uncle   . Diabetes Maternal Grandmother    History  Substance Use Topics  . Smoking status: Former Smoker    Quit date: 04/18/2012  . Smokeless tobacco: Never Used  . Alcohol Use: No   OB History   Grav Para Term Preterm Abortions TAB SAB Ect Mult Living   1 1  1      1      Review of Systems  Constitutional: Negative for fever, chills and diaphoresis.  HENT: Negative for congestion and rhinorrhea.   Respiratory: Negative for cough, shortness of breath and wheezing.   Cardiovascular: Negative for chest pain and leg swelling.  Gastrointestinal: Positive for abdominal pain. Negative for nausea, vomiting and  diarrhea.  Genitourinary: Positive for vaginal discharge. Negative for dysuria, urgency, frequency, flank pain, vaginal bleeding and difficulty urinating.  Musculoskeletal: Negative for neck pain and neck stiffness.  Skin: Negative for rash.  Neurological: Negative for weakness, numbness and headaches.  All other systems reviewed and are negative.      Allergies  Penicillins  Home Medications   Current Outpatient Rx  Name  Route  Sig  Dispense  Refill  . fluconazole (DIFLUCAN) 150 MG tablet   Oral   Take 150 mg by mouth once.         . Levonorgestrel (SKYLA) 13.5 MG IUD   Intrauterine   13.5 mg by Intrauterine route once.         . metroNIDAZOLE (FLAGYL) 500 MG tablet   Oral   Take 1 tablet (500 mg total) by mouth 2 (two) times daily.   14 tablet   0   . ondansetron (ZOFRAN-ODT) 4 MG disintegrating tablet   Oral   Take 1 tablet (4 mg total) by mouth once.   20 tablet   0    BP 125/69  Pulse 74  Temp(Src) 97.9 F (36.6 C) (Oral)  Resp 20  Ht 6' (1.829 m)  Wt 190 lb (86.183 kg)  BMI 25.76 kg/m2  SpO2 99%  LMP 04/11/2013 Physical Exam  Nursing note and vitals reviewed. Constitutional: She is oriented to person, place, and time. She appears well-developed and well-nourished. No distress.  HENT:  Head: Normocephalic and atraumatic.  Mouth/Throat: Oropharynx is clear and moist.  Eyes: Conjunctivae and EOM are normal. Pupils are equal, round, and reactive to light. No scleral icterus.  Neck: Normal range of motion. Neck supple. No JVD present.  Cardiovascular: Normal rate, regular rhythm, normal heart sounds and intact distal pulses.  Exam reveals no gallop and no friction rub.   No murmur heard. Pulmonary/Chest: Effort normal and breath sounds normal. No respiratory distress. She has no wheezes. She has no rales.  Abdominal: Soft. Normal appearance and bowel sounds are normal. She exhibits no distension. There is no tenderness. There is no rigidity, no  rebound, no guarding, no CVA tenderness, no tenderness at McBurney's point and negative Murphy's sign. No hernia.  Musculoskeletal: She exhibits no tenderness.  Neurological: She is alert and oriented to person, place, and time. No cranial nerve deficit. Coordination normal.  Skin: Skin is warm and dry. No rash noted. She is not diaphoretic.  Psychiatric: She has a normal mood and affect.    ED Course  Procedures (including critical care time) Labs Review Labs Reviewed  URINALYSIS, ROUTINE W REFLEX MICROSCOPIC - Abnormal; Notable for the following:    Leukocytes, UA TRACE (*)    All other components within normal limits  CBC WITH DIFFERENTIAL  COMPREHENSIVE METABOLIC PANEL  LIPASE, BLOOD  URINE MICROSCOPIC-ADD ON  POC URINE PREG, ED   Imaging Review No results found.   EKG Interpretation None      MDM  23 year old female here with chronic abdominal pain, greater than 2 months. Poorly localized throughout her lower abdomen. She has had some associated nausea but no vomiting. No fever, urinary symptoms. She has some scant vaginal discharge and is being treated currently for trichomonas. She is worried about her appendix.  Afebrile, normal vital signs. She is well appearing. No acute distress. Abdomen soft, nontender, no rebound or guarding. No focal tenderness.  Patient just had a pelvic exam performed today at the [facility]. She states she does not want another pelvic exam. I advised her that although my evaluation of her would not be complete without a pelvic exam she has the right to refuse at risk of missing some potentially dangerous cause of her pelvic pain such as an adnexal mass. She is aware of the risks and does not want pelvic exam.  Her UA is normal, her urine pregnancy test is negative, labs were sent as per triage protocol and she has no leukocytosis, normal complete metabolic panel, normal lipase. I do not feel imaging is warranted. This is likely functional  versus gynecologic cause of her abdominal pain that I do not suspect any emergent or surgical process such as appendicitis, bowel stretching, pyelonephritis, diverticulitis. She is discharged home and is given a prescription for Zofran. Given return precautions and is to follow with her primary care physician for reevaluation.  Final diagnoses:  Abdominal pain  Nausea      Toney Sang, MD 06/01/13 [id number]

## 2013-05-31 NOTE — ED Notes (Signed)
Pt alert x4 respirations easy non labored.  

## 2013-06-02 NOTE — ED Provider Notes (Signed)
I performed a history and physical examination of  Candace Richardson and discussed her management with the resident physician. I agree with the history, physical, assessment, and plan of care, with the following exceptions: None I was present for the following procedures: None  Time Spent in Critical Care of the patient: None  Time spent in discussions with the patient and family: 5 minutes  Candace Richardson  Pt with abd pain x 2 months. Pain is diffuse. She had concerns for appendicitis. She just had pelvic examuation done at health center. Exam is benign, and we have no concerns for appy, or any acute or severe abdominal process at this time.     Candace KaplanAnkit Achille Xiang, MD 06/02/13 [phone]

## 2013-07-23 ENCOUNTER — Encounter (HOSPITAL_COMMUNITY): Payer: Self-pay | Admitting: Emergency Medicine

## 2013-07-23 ENCOUNTER — Emergency Department (HOSPITAL_COMMUNITY)
Admission: EM | Admit: 2013-07-23 | Discharge: 2013-07-23 | Disposition: A | Discharge status: Home / Self Care | Payer: Medicaid Other | Attending: Emergency Medicine | Admitting: Emergency Medicine

## 2013-07-23 DIAGNOSIS — K649 Unspecified hemorrhoids: Secondary | ICD-10-CM

## 2013-07-23 DIAGNOSIS — Z862 Personal history of diseases of the blood and blood-forming organs and certain disorders involving the immune mechanism: Secondary | ICD-10-CM | POA: Insufficient documentation

## 2013-07-23 DIAGNOSIS — Z880 Allergy status to penicillin: Secondary | ICD-10-CM | POA: Insufficient documentation

## 2013-07-23 DIAGNOSIS — Z8639 Personal history of other endocrine, nutritional and metabolic disease: Secondary | ICD-10-CM | POA: Insufficient documentation

## 2013-07-23 DIAGNOSIS — N898 Other specified noninflammatory disorders of vagina: Secondary | ICD-10-CM | POA: Insufficient documentation

## 2013-07-23 DIAGNOSIS — Z3202 Encounter for pregnancy test, result negative: Secondary | ICD-10-CM | POA: Insufficient documentation

## 2013-07-23 DIAGNOSIS — K644 Residual hemorrhoidal skin tags: Secondary | ICD-10-CM | POA: Insufficient documentation

## 2013-07-23 DIAGNOSIS — Z87891 Personal history of nicotine dependence: Secondary | ICD-10-CM | POA: Insufficient documentation

## 2013-07-23 DIAGNOSIS — Z8659 Personal history of other mental and behavioral disorders: Secondary | ICD-10-CM | POA: Insufficient documentation

## 2013-07-23 LAB — RAPID HIV SCREEN (WH-MAU): SUDS RAPID HIV SCREEN: NONREACTIVE

## 2013-07-23 LAB — URINALYSIS, ROUTINE W REFLEX MICROSCOPIC
BILIRUBIN URINE: NEGATIVE
Glucose, UA: NEGATIVE mg/dL
HGB URINE DIPSTICK: NEGATIVE
KETONES UR: NEGATIVE mg/dL
NITRITE: NEGATIVE
PROTEIN: NEGATIVE mg/dL
Specific Gravity, Urine: 1.027 (ref 1.005–1.030)
UROBILINOGEN UA: 0.2 mg/dL (ref 0.0–1.0)
pH: 5.5 (ref 5.0–8.0)

## 2013-07-23 LAB — WET PREP, GENITAL
Trich, Wet Prep: NONE SEEN
YEAST WET PREP: NONE SEEN

## 2013-07-23 LAB — URINE MICROSCOPIC-ADD ON

## 2013-07-23 LAB — POC URINE PREG, ED: Preg Test, Ur: NEGATIVE

## 2013-07-23 MED ORDER — CEFTRIAXONE SODIUM 250 MG IJ SOLR
250.0000 mg | Freq: Once | INTRAMUSCULAR | Status: AC
  Administered 2013-07-23: 250 mg via INTRAMUSCULAR
  Filled 2013-07-23: qty 250

## 2013-07-23 MED ORDER — LIDOCAINE HCL (PF) 1 % IJ SOLN
INTRAMUSCULAR | Status: AC
  Administered 2013-07-23: 5 mL
  Filled 2013-07-23: qty 5

## 2013-07-23 MED ORDER — AZITHROMYCIN 250 MG PO TABS
1000.0000 mg | ORAL_TABLET | Freq: Once | ORAL | Status: AC
  Administered 2013-07-23: 1000 mg via ORAL
  Filled 2013-07-23: qty 4

## 2013-07-23 NOTE — ED Provider Notes (Signed)
  Medical screening examination/treatment/procedure(s) were performed by non-physician practitioner and as supervising physician I was immediately available for consultation/collaboration.   EKG Interpretation None         Gerhard Munchobert Ayelen Sciortino, MD 07/23/13 2356

## 2013-07-23 NOTE — ED Provider Notes (Signed)
CSN: [id number]     Arrival date & time 07/23/13  1802 History   First MD Initiated Contact with Patient 07/23/13 2004     Chief Complaint  Patient presents with  . Wound Infection     (Consider location/radiation/quality/duration/timing/severity/associated sxs/prior Treatment) HPI History provided by pt.   Pt presents w/ painful, draining mass between vagina and anus x 2 days.  Associated w/ intermittent lower abdominal pain that started the same day, as well as vaginal discharge.  Denies fever, N/V, change in bowels, other GU sx, other skin changes.  Denies trauma.  Pt has several sexual partners and does not use barrier protection.  She has had trich in the past.  Most recently checked for STDs in 05/2013.   Past Medical History  Diagnosis Date  . Polycystic ovary disease   . Depression    Past Surgical History  Procedure Laterality Date  . Ear examination under anesthesia     Family History  Problem Relation Age of Onset  . Heart disease Maternal Uncle   . Diabetes Maternal Grandmother    History  Substance Use Topics  . Smoking status: Former Smoker    Quit date: 04/18/2012  . Smokeless tobacco: Never Used  . Alcohol Use: No   OB History   Grav Para Term Preterm Abortions TAB SAB Ect Mult Living   1 1  1      1      Review of Systems  All other systems reviewed and are negative.     Allergies  Penicillins  Home Medications   Prior to Admission medications   Medication Sig Start Date End Date Taking? Authorizing Provider  Levonorgestrel (SKYLA) 13.5 MG IUD 13.5 mg by Intrauterine route once. 11/26/12  Yes Antionette CharLisa Jackson-Moore, MD   BP 119/61  Pulse 62  Temp(Src) 99 F (37.2 C) (Oral)  Resp 16  Ht 6' (1.829 m)  Wt 220 lb 9.6 oz (100.064 kg)  BMI 29.91 kg/m2  SpO2 98% Physical Exam  Nursing note and vitals reviewed. Constitutional: She is oriented to person, place, and time. She appears well-developed and well-nourished. No distress.  HENT:  Head:  Normocephalic and atraumatic.  Eyes:  Normal appearance  Neck: Normal range of motion.  Cardiovascular: Normal rate and regular rhythm.   Pulmonary/Chest: Effort normal and breath sounds normal. No respiratory distress.  Abdominal: Soft. Bowel sounds are normal. She exhibits no distension and no mass. There is no rebound and no guarding.  Mild tenderness in LLQ only  Genitourinary:  No CVA tenderness.  Nml external genitalia.  Moderate amt of white vaginal discharge.  Cervix closed and appears nml.  No adnexal or cervical motion tenderess.  Non-thrombosed external hemorrhoid.  Palpation reproduces the pain that patient has been experiencing. There is no perineal or perianal abscess.    Musculoskeletal: Normal range of motion.  Neurological: She is alert and oriented to person, place, and time.  Skin: Skin is warm and dry. No rash noted.  Psychiatric: She has a normal mood and affect. Her behavior is normal.    ED Course  Procedures (including critical care time) Labs Review Labs Reviewed  WET PREP, GENITAL - Abnormal; Notable for the following:    Clue Cells Wet Prep HPF POC FEW (*)    WBC, Wet Prep HPF POC TOO NUMEROUS TO COUNT (*)    All other components within normal limits  URINALYSIS, ROUTINE W REFLEX MICROSCOPIC - Abnormal; Notable for the following:    Leukocytes, UA SMALL (*)  All other components within normal limits  GC/CHLAMYDIA PROBE AMP  URINE CULTURE  RAPID HIV SCREEN ([facility])  URINE MICROSCOPIC-ADD ON  HIV ANTIBODY (ROUTINE TESTING)  POC URINE PREG, ED    Imaging Review No results found.   EKG Interpretation None      MDM   Final diagnoses:  Hemorrhoid  Vaginal discharge    23yo healthy F presents w/ painful perianal mass as well as intermittent lower abd pain and vaginal discharge.  On exam, afebrile, non-toxic appearing, abd benign w/ exception of mild LLQ ttp, mod amt vaginal discharge but otherwise unremarkable genitalia, non-thrombosed external  hemorrhoid.  Pain is reproduced w/ palpation of hemorrhoid.  Pt is high risk for STD.  Gc/chlam cx and rapid HIV screen as well as wet prep and U/A pending.  Will treat empirically w/ rocephin and zithromax.  9:39 PM   Wet prep shows Too Numerous to Count WBCs.  Hapid HIV non-reactive.  U/A sent for culture.   Results discussed w/ patient, w/ exception of HIV test because it hadn't resulted yet.  I recommended f/u w/ her gynecologist.  For hemorrhoid she will try stool softener, tucks and anusol.  Return precautions discussed. 11:12 PM    Otilio Miuatherine E Alfie Alderfer, PA-C 07/23/13 2312

## 2013-07-23 NOTE — Discharge Instructions (Signed)
To treat your hemorrhoid, take an over the counter stool softener such as docusate sodium.  You can wipe with tuck's to prevent friction and increased pain, and you can also apply anusol cream.  Follow up with your gynecologist for further evaluation of lower abdominal pain and vaginal discharge.  Return to the ER if you develop fever, worsening abdominal pain, vomiting, or any other symptoms that are concerning to you.  Hemorrhoids Hemorrhoids are swollen veins around the rectum or anus. There are two types of hemorrhoids:   Internal hemorrhoids. These occur in the veins just inside the rectum. They may poke through to the outside and become irritated and painful.  External hemorrhoids. These occur in the veins outside the anus and can be felt as a painful swelling or hard lump near the anus. CAUSES  Pregnancy.   Obesity.   Constipation or diarrhea.   Straining to have a bowel movement.   Sitting for long periods on the toilet.  Heavy lifting or other activity that caused you to strain.  Anal intercourse. SYMPTOMS   Pain.   Anal itching or irritation.   Rectal bleeding.   Fecal leakage.   Anal swelling.   One or more lumps around the anus.  DIAGNOSIS  Your caregiver may be able to diagnose hemorrhoids by visual examination. Other examinations or tests that may be performed include:   Examination of the rectal area with a gloved hand (digital rectal exam).   Examination of anal canal using a small tube (scope).   A blood test if you have lost a significant amount of blood.  A test to look inside the colon (sigmoidoscopy or colonoscopy). TREATMENT Most hemorrhoids can be treated at home. However, if symptoms do not seem to be getting better or if you have a lot of rectal bleeding, your caregiver may perform a procedure to help make the hemorrhoids get smaller or remove them completely. Possible treatments include:   Placing a rubber band at the base of the  hemorrhoid to cut off the circulation (rubber band ligation).   Injecting a chemical to shrink the hemorrhoid (sclerotherapy).   Using a tool to burn the hemorrhoid (infrared light therapy).   Surgically removing the hemorrhoid (hemorrhoidectomy).   Stapling the hemorrhoid to block blood flow to the tissue (hemorrhoid stapling).  HOME CARE INSTRUCTIONS   Eat foods with fiber, such as whole grains, beans, nuts, fruits, and vegetables. Ask your doctor about taking products with added fiber in them (fibersupplements).  Increase fluid intake. Drink enough water and fluids to keep your urine clear or pale yellow.   Exercise regularly.   Go to the bathroom when you have the urge to have a bowel movement. Do not wait.   Avoid straining to have bowel movements.   Keep the anal area dry and clean. Use wet toilet paper or moist towelettes after a bowel movement.   Medicated creams and suppositories may be used or applied as directed.   Only take over-the-counter or prescription medicines as directed by your caregiver.   Take warm sitz baths for 15 20 minutes, 3 4 times a day to ease pain and discomfort.   Place ice packs on the hemorrhoids if they are tender and swollen. Using ice packs between sitz baths may be helpful.   Put ice in a plastic bag.   Place a towel between your skin and the bag.   Leave the ice on for 15 20 minutes, 3 4 times a day.  Do not use a donut-shaped pillow or sit on the toilet for long periods. This increases blood pooling and pain.  SEEK MEDICAL CARE IF:  You have increasing pain and swelling that is not controlled by treatment or medicine.  You have uncontrolled bleeding.  You have difficulty or you are unable to have a bowel movement.  You have pain or inflammation outside the area of the hemorrhoids. MAKE SURE YOU:  Understand these instructions.  Will watch your condition.  Will get help right away if you are not doing well  or get worse. Document Released: 02/23/2000 Document Revised: 02/12/2012 Document Reviewed: 12/31/2011 [facility] Patient Information 2014 [place], [place].

## 2013-07-23 NOTE — ED Notes (Signed)
She c/o a "painful bump between my vagina and butt and theres something leaking down there."

## 2013-07-24 LAB — HIV ANTIBODY (ROUTINE TESTING W REFLEX): HIV 1&2 Ab, 4th Generation: NONREACTIVE

## 2013-07-24 LAB — GC/CHLAMYDIA PROBE AMP
CT PROBE, AMP APTIMA: POSITIVE — AB
GC PROBE AMP APTIMA: NEGATIVE

## 2013-07-25 ENCOUNTER — Telehealth (HOSPITAL_BASED_OUTPATIENT_CLINIC_OR_DEPARTMENT_OTHER): Payer: Self-pay | Admitting: Emergency Medicine

## 2013-07-25 NOTE — Telephone Encounter (Signed)
+  Chlamydia. Patient treated with Rocephin and Zithromax. [facility] faxed. 

## 2013-07-26 LAB — URINE CULTURE

## 2013-07-29 NOTE — Telephone Encounter (Signed)
Post ED Visit - Positive Culture Follow-up  Culture report reviewed by antimicrobial stewardship pharmacist: []  Wes Dulaney, Pharm.D., BCPS []  Celedonio MiyamotoJeremy Frens, Pharm.D., BCPS []  Georgina PillionElizabeth Martin, Pharm.D., BCPS []  ElizabethtownMinh Pham, [address] Rainbow BoulevardPharm.D., BCPS, AAHIVP []  Estella HuskMichelle Turner, Pharm.D., BCPS, AAHIVP []  Harvie JuniorNathan Cope, Pharm.D. [x]  Ofilia NeasSandra Yang, Pharm.D.  Positive urine culture Per [facility] LLCKaitlyn Szekalski PA-C, no treatment needed and no further patient follow-up is required at this time.  Nain Rudd 07/29/2013, 10:42 AM

## 2013-08-19 ENCOUNTER — Encounter: Payer: Self-pay | Admitting: Obstetrics & Gynecology

## 2013-08-19 ENCOUNTER — Ambulatory Visit (INDEPENDENT_AMBULATORY_CARE_PROVIDER_SITE_OTHER): Payer: Medicaid Other | Admitting: Obstetrics & Gynecology

## 2013-08-19 ENCOUNTER — Telehealth: Payer: Self-pay | Admitting: *Deleted

## 2013-08-19 VITALS — BP 132/67 | HR 68 | Temp 98.6°F | Ht 69.25 in | Wt 228.0 lb

## 2013-08-19 DIAGNOSIS — Z202 Contact with and (suspected) exposure to infections with a predominantly sexual mode of transmission: Secondary | ICD-10-CM

## 2013-08-19 MED ORDER — AZITHROMYCIN 500 MG PO TABS: 2000.0000 mg | ORAL_TABLET | Freq: Once | ORAL | Status: DC

## 2013-08-19 MED ORDER — LEVOFLOXACIN 500 MG PO TABS: 500.0000 mg | ORAL_TABLET | Freq: Every day | ORAL | Status: DC

## 2013-08-19 NOTE — Telephone Encounter (Signed)
Patient contact office to verify if prescriptions has been sent to the pharmacy. Advised patient that prescriptions have been sent.

## 2013-08-20 ENCOUNTER — Encounter: Payer: Self-pay | Admitting: Obstetrics & Gynecology

## 2013-08-20 LAB — HIV ANTIBODY (ROUTINE TESTING W REFLEX): HIV 1&2 Ab, 4th Generation: NONREACTIVE

## 2013-08-20 LAB — WET PREP BY MOLECULAR PROBE
CANDIDA SPECIES: NEGATIVE
GARDNERELLA VAGINALIS: POSITIVE — AB
Trichomonas vaginosis: NEGATIVE

## 2013-08-20 LAB — RPR

## 2013-08-20 LAB — GC/CHLAMYDIA PROBE AMP
CT Probe RNA: NEGATIVE
GC PROBE AMP APTIMA: NEGATIVE
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
## 2013-08-20 NOTE — Patient Instructions (Signed)
Pelvic Inflammatory Disease °Pelvic inflammatory disease (PID) refers to an infection in some or all of the female organs. The infection can be in the uterus, ovaries, fallopian tubes, or the surrounding tissues in the pelvis. PID can cause abdominal or pelvic pain that comes on suddenly (acute pelvic pain). PID is a serious infection because it can lead to lasting (chronic) pelvic pain or the inability to have children (infertile).  °CAUSES  °The infection is often caused by the normal bacteria found in the vaginal tissues. PID may also be caused by an infection that is spread during sexual contact. PID can also occur following:  °· The birth of a baby.   °· A miscarriage.   °· An abortion.   °· Major pelvic surgery.   °· The use of an intrauterine device (IUD).   °· A sexual assault.   °RISK FACTORS °Certain factors can put a person at higher risk for PID, such as: °· Being younger than 25 years. °· Being sexually active at a young age. °· Using nonbarrier contraception. °· Having multiple sexual partners. °· Having sex with someone who has symptoms of a genital infection. °· Using oral contraception. °Other times, certain behaviors can increase the possibility of getting PID, such as: °· Having sex during your period. °· Using a vaginal douche. °· Having an intrauterine device (IUD) in place. °SYMPTOMS  °· Abdominal or pelvic pain.   °· Fever.   °· Chills.   °· Abnormal vaginal discharge. °· Abnormal uterine bleeding.   °· Unusual pain shortly after finishing your period. °DIAGNOSIS  °Your caregiver will choose some of the following methods to make a diagnosis, such as:  °· Performing a physical exam and history. A pelvic exam typically reveals a very tender uterus and surrounding pelvis.   °· Ordering laboratory tests including a pregnancy test, blood tests, and urine test.  °· Ordering cultures of the vagina and cervix to check for a sexually transmitted infection (STI). °· Performing an ultrasound.    °· Performing a laparoscopic procedure to look inside the pelvis.   °TREATMENT  °· Antibiotic medicines may be prescribed and taken by mouth.   °· Sexual partners may be treated when the infection is caused by a sexually transmitted disease (STD).   °· Hospitalization may be needed to give antibiotics intravenously. °· Surgery may be needed, but this is rare. °It may take weeks until you are completely well. If you are diagnosed with PID, you should also be checked for human immunodeficiency virus (HIV).   °HOME CARE INSTRUCTIONS  °· If given, take your antibiotics as directed. Finish the medicine even if you start to feel better.   °· Only take over-the-counter or prescription medicines for pain, discomfort, or fever as directed by your caregiver.   °· Do not have sexual intercourse until treatment is completed or as directed by your caregiver. If PID is confirmed, your recent sexual partner(s) will need treatment.   °· Keep your follow-up appointments. °SEEK MEDICAL CARE IF:  °· You have increased or abnormal vaginal discharge.   °· You need prescription medicine for your pain.   °· You vomit.   °· You cannot take your medicines.   °· Your partner has an STD.   °SEEK IMMEDIATE MEDICAL CARE IF:  °· You have a fever.   °· You have increased abdominal or pelvic pain.   °· You have chills.   °· You have pain when you urinate.   °· You are not better after 72 hours following treatment.   °MAKE SURE YOU:  °· Understand these instructions. °· Will watch your condition. °· Will get help right away if you are not doing well or get worse. °  Document Released: 02/25/2005 Document Revised: 06/22/2012 Document Reviewed: 02/21/2011 [facility] Patient Information 2014 [place], [place]. Safe Sex Safe sex is about reducing the risk of giving or getting a sexually transmitted disease (STD). STDs are spread through sexual contact involving the genitals, mouth, or rectum. Some STDS can be cured and others cannot. Safe sex can also  prevent unintended pregnancies.  SAFE SEX PRACTICES  Limit your sexual activity to only one partner who is only having sex with you.  Talk to your partner about their past partners, past STDs, and drug use.  Use a condom every time you have sexual intercourse. This includes vaginal, oral, and anal sexual activity. Both females and males should wear condoms during oral sex. Only use latex or polyurethane condoms and water-based lubricants. Petroleum-based lubricants or oils used to lubricate a condom will weaken the condom and increase the chance that it will break. The condom should be in place from the beginning to the end of sexual activity. Wearing a condom reduces, but does not completely eliminate, your risk of getting or giving a STD. STDs can be spread by contact with skin of surrounding areas.  Get vaccinated for hepatitis B and HPV.  Avoid alcohol and recreational drugs which can affect your judgement. You may forget to use a condom or participate in high-risk sex.  For females, avoid douching after sexual intercourse. Douching can spread an infection farther into the reproductive tract.  Check your body for signs of sores, blisters, rashes, or unusual discharge. See your caregiver if you notice any of these signs.  Avoid sexual contact if you have symptoms of an infection or are being treated for an STD. If you or your partner has herpes, avoid sexual contact when blisters are present. Use condoms at all other times.  See your caregiver for regular screenings, examinations, and tests for STDs. Before having sex with a new partner, each of you should be screened for STDs and talk about the results with your partner. BENEFITS OF SAFE SEX   There is less of a chance of getting or giving an STD.  You can prevent unwanted or unintended pregnancies.  By discussing safer sex concerns with your partner, you may increase feelings of intimacy, comfort, trust, and honesty between the both of  you. Document Released: 04/04/2004 Document Revised: 11/20/2011 Document Reviewed: 08/19/2011 [facility] Patient Information 2014 [place]ExitCare, [place]LL[place].

## 2013-08-23 ENCOUNTER — Ambulatory Visit: Payer: Medicaid Other | Admitting: Obstetrics & Gynecology

## 2013-08-23 ENCOUNTER — Ambulatory Visit: Payer: Self-pay | Admitting: Obstetrics & Gynecology

## 2013-09-15 ENCOUNTER — Ambulatory Visit: Payer: Medicaid Other | Admitting: Obstetrics & Gynecology

## 2013-10-02 ENCOUNTER — Encounter (HOSPITAL_COMMUNITY): Payer: Self-pay | Admitting: Emergency Medicine

## 2013-10-02 ENCOUNTER — Emergency Department (HOSPITAL_COMMUNITY)
Admission: EM | Admit: 2013-10-02 | Discharge: 2013-10-02 | Disposition: A | Discharge status: Home / Self Care | Payer: Medicaid Other | Attending: Emergency Medicine | Admitting: Emergency Medicine

## 2013-10-02 DIAGNOSIS — R109 Unspecified abdominal pain: Secondary | ICD-10-CM | POA: Diagnosis not present

## 2013-10-02 DIAGNOSIS — Z79899 Other long term (current) drug therapy: Secondary | ICD-10-CM | POA: Insufficient documentation

## 2013-10-02 DIAGNOSIS — Z880 Allergy status to penicillin: Secondary | ICD-10-CM | POA: Insufficient documentation

## 2013-10-02 DIAGNOSIS — F17200 Nicotine dependence, unspecified, uncomplicated: Secondary | ICD-10-CM | POA: Diagnosis not present

## 2013-10-02 DIAGNOSIS — E282 Polycystic ovarian syndrome: Secondary | ICD-10-CM | POA: Insufficient documentation

## 2013-10-02 DIAGNOSIS — K921 Melena: Secondary | ICD-10-CM | POA: Insufficient documentation

## 2013-10-02 DIAGNOSIS — F329 Major depressive disorder, single episode, unspecified: Secondary | ICD-10-CM | POA: Insufficient documentation

## 2013-10-02 DIAGNOSIS — F3289 Other specified depressive episodes: Secondary | ICD-10-CM | POA: Diagnosis not present

## 2013-10-02 DIAGNOSIS — K625 Hemorrhage of anus and rectum: Secondary | ICD-10-CM | POA: Diagnosis present

## 2013-10-02 LAB — POC OCCULT BLOOD, ED: FECAL OCCULT BLD: NEGATIVE

## 2013-10-02 LAB — CBC
HEMATOCRIT: 41.5 % (ref 36.0–46.0)
Hemoglobin: 14.2 g/dL (ref 12.0–15.0)
MCH: 30 pg (ref 26.0–34.0)
MCHC: 34.2 g/dL (ref 30.0–36.0)
MCV: 87.6 fL (ref 78.0–100.0)
PLATELETS: 192 10*3/uL (ref 150–400)
RBC: 4.74 MIL/uL (ref 3.87–5.11)
RDW: 13.8 % (ref 11.5–15.5)
WBC: 8.3 10*3/uL (ref 4.0–10.5)

## 2013-10-02 LAB — COMPREHENSIVE METABOLIC PANEL
ALBUMIN: 4.3 g/dL (ref 3.5–5.2)
ALT: 10 U/L (ref 0–35)
ANION GAP: 11 (ref 5–15)
AST: 15 U/L (ref 0–37)
Alkaline Phosphatase: 60 U/L (ref 39–117)
BUN: 11 mg/dL (ref 6–23)
CO2: 26 mEq/L (ref 19–32)
CREATININE: 0.78 mg/dL (ref 0.50–1.10)
Calcium: 9.3 mg/dL (ref 8.4–10.5)
Chloride: 102 mEq/L (ref 96–112)
GFR calc Af Amer: >90 mL/min (ref >90)
GFR calc non Af Amer: >90 mL/min (ref >90)
Glucose, Bld: 107 mg/dL — ABNORMAL HIGH (ref 70–99)
Potassium: 3.6 mEq/L — ABNORMAL LOW (ref 3.7–5.3)
Sodium: 139 mEq/L (ref 137–147)
TOTAL PROTEIN: 7.5 g/dL (ref 6.0–8.3)
Total Bilirubin: 0.4 mg/dL (ref 0.3–1.2)

## 2013-10-02 NOTE — ED Provider Notes (Signed)
CSN: [id number]     Arrival date & time 10/02/13  0044 History   First MD Initiated Contact with Patient 10/02/13 [phone]     Chief Complaint  Patient presents with  . Rectal Bleeding     (Consider location/radiation/quality/duration/timing/severity/associated sxs/prior Treatment) HPI Comments: 23 year old female history depression presents after episode of blood in the stool today. Patient mild abdominal discomfort at the time which has resolved. Patient denies fevers, chills, abdominal surgery history, GI/diverticulitis history. No inflammatory bowel disease history known. Patient has not had any episodes will be observed in ER. Patient has never had a colonoscopy before  Patient is a 23 y.o. female presenting with hematochezia. The history is provided by the patient.  Rectal Bleeding Associated symptoms: abdominal pain   Associated symptoms: no fever, no light-headedness and no vomiting     Past Medical History  Diagnosis Date  . Polycystic ovary disease   . Depression    Past Surgical History  Procedure Laterality Date  . Ear examination under anesthesia     Family History  Problem Relation Age of Onset  . Heart disease Maternal Uncle   . Diabetes Maternal Grandmother    History  Substance Use Topics  . Smoking status: Current Some Day Smoker  . Smokeless tobacco: Never Used  . Alcohol Use: No   OB History   Grav Para Term Preterm Abortions TAB SAB Ect Mult Living   1 1  1      1      Review of Systems  Constitutional: Negative for fever and chills.  HENT: Negative for congestion.   Eyes: Negative for visual disturbance.  Respiratory: Negative for shortness of breath.   Cardiovascular: Negative for chest pain.  Gastrointestinal: Positive for abdominal pain, blood in stool and hematochezia. Negative for vomiting.  Genitourinary: Negative for dysuria and flank pain.  Musculoskeletal: Negative for back pain, neck pain and neck stiffness.  Skin: Negative for rash.   Neurological: Negative for light-headedness and headaches.      Allergies  Penicillins  Home Medications   Prior to Admission medications   Medication Sig Start Date End Date Taking? Authorizing Provider  Levonorgestrel (SKYLA) 13.5 MG IUD 13.5 mg by Intrauterine route once. 11/26/12  Yes Antionette CharLisa Jackson-Moore, MD   BP 142/83  Pulse 87  Temp(Src) 98.4 F (36.9 C) (Oral)  Resp 18  SpO2 100% Physical Exam  Nursing note and vitals reviewed. Constitutional: She is oriented to person, place, and time. She appears well-developed and well-nourished.  HENT:  Head: Normocephalic and atraumatic.  Eyes: Conjunctivae are normal. Right eye exhibits no discharge. Left eye exhibits no discharge.  Neck: Normal range of motion. Neck supple. No tracheal deviation present.  Cardiovascular: Normal rate and regular rhythm.   Pulmonary/Chest: Effort normal and breath sounds normal.  Abdominal: Soft. She exhibits no distension. There is no tenderness. There is no guarding.  Genitourinary:  Small skin tag, no fissure or hemorrhoid appreciated. Brown stool.  Musculoskeletal: She exhibits no edema.  Neurological: She is alert and oriented to person, place, and time.  Skin: Skin is warm. No rash noted.  Psychiatric: She has a normal mood and affect.    ED Course  Procedures (including critical care time) Labs Review Labs Reviewed  COMPREHENSIVE METABOLIC PANEL - Abnormal; Notable for the following:    Potassium 3.6 (*)    Glucose, Bld 107 (*)    All other components within normal limits  CBC  OCCULT BLOOD X 1 CARD TO LAB, STOOL  POC  OCCULT BLOOD, ED    Imaging Review No results found.   EKG Interpretation None      MDM   Final diagnoses:  Blood in stool   Patient low risk for significant GI bleed, no abdominal pain on exam hemoglobin normal. Patient observed in ER for 5 hours and no bleeding episode. Discussed followup with primary care Dr. and if worsening symptoms will need to  see gastroenterology for further evaluation. Discussed differential diagnosis with the patient. If you were given medicines take as directed.  If you are on coumadin or contraceptives realize their levels and effectiveness is altered by many different medicines.  If you have any reaction (rash, tongues swelling, other) to the medicines stop taking and see a physician.   Please follow up as directed and return to the ER or see a physician for new or worsening symptoms.  Thank you. Filed Vitals:   10/02/13 0044 10/02/13 0055 10/02/13 0448  BP: 118/68 120/62 142/83  Pulse: 70 88 87  Temp:  98.4 F (36.9 C)   TempSrc:  Oral   Resp: 18 16 18   SpO2:  97% 100%       Enid Skeens, MD 10/02/13 0600

## 2013-10-02 NOTE — ED Notes (Signed)
Pt arrived to the Ed with a complaint of rectal bleeding and associated abdominal pain.  Pt states symptoms began several hours ago.  Pt noticed blood in her stool.  Pt states her the abdominal pain began around the same time as the bleeding.

## 2013-10-02 NOTE — Discharge Instructions (Signed)
If you were given medicines take as directed.  If you are on coumadin or contraceptives realize their levels and effectiveness is altered by many different medicines.  If you have any reaction (rash, tongues swelling, other) to the medicines stop taking and see a physician.   Please follow up as directed and return to the ER or see a physician for new or worsening symptoms.  Thank you. Filed Vitals:   10/02/13 0044 10/02/13 0055 10/02/13 0448  BP: 118/68 120/62 142/83  Pulse: 70 88 87  Temp:  98.4 F (36.9 C)   TempSrc:  Oral   Resp: 18 16 18   SpO2:  97% 100%    Bloody Stools Bloody stools means there is blood in your poop (stool). It is a sign that there is a problem somewhere in the digestive system. It is important for your doctor to find the cause of your bleeding, so the problem can be treated.  HOME CARE  Only take medicine as told by your doctor.  Eat foods with fiber (prunes, bran cereals).  Drink enough fluids to keep your pee (urine) clear or pale yellow.  Sit in warm water (sitz bath) for 10 to 15 minutes as told by your doctor.  Know how to take your medicines (enemas, suppositories) if advised by your doctor.  Watch for signs that you are getting better or getting worse. GET HELP RIGHT AWAY IF:   You are not getting better.  You start to get better but then get worse again.  You have new problems.  You have severe bleeding from the place where poop comes out (rectum) that does not stop.  You throw up (vomit) blood.  You feel weak or pass out (faint).  You have a fever. MAKE SURE YOU:   Understand these instructions.  Will watch your condition.  Will get help right away if you are not doing well or get worse. Document Released: 02/13/2009 Document Revised: 05/20/2011 Document Reviewed: 07/13/2010 [facility] Patient Information 2015 [place]ExitCare, [place]LL[place]. This information is not intended to replace advice given to you by your health care provider. Make sure you  discuss any questions you have with your health care provider.

## 2013-11-05 ENCOUNTER — Inpatient Hospital Stay (HOSPITAL_COMMUNITY)
Admission: AD | Admit: 2013-11-05 | Discharge: 2013-11-05 | Disposition: A | Discharge status: Home / Self Care | Payer: Medicaid Other | Source: Ambulatory Visit | Attending: Obstetrics | Admitting: Obstetrics

## 2013-11-05 ENCOUNTER — Encounter (HOSPITAL_COMMUNITY): Payer: Self-pay | Admitting: *Deleted

## 2013-11-05 DIAGNOSIS — Z30431 Encounter for routine checking of intrauterine contraceptive device: Secondary | ICD-10-CM | POA: Diagnosis not present

## 2013-11-05 DIAGNOSIS — N946 Dysmenorrhea, unspecified: Secondary | ICD-10-CM | POA: Diagnosis not present

## 2013-11-05 DIAGNOSIS — N938 Other specified abnormal uterine and vaginal bleeding: Secondary | ICD-10-CM | POA: Diagnosis present

## 2013-11-05 DIAGNOSIS — N949 Unspecified condition associated with female genital organs and menstrual cycle: Secondary | ICD-10-CM | POA: Diagnosis present

## 2013-11-05 DIAGNOSIS — Z975 Presence of (intrauterine) contraceptive device: Secondary | ICD-10-CM

## 2013-11-05 HISTORY — DX: Post-traumatic stress disorder, unspecified: F43.10

## 2013-11-05 HISTORY — DX: Bipolar disorder, unspecified: F31.9

## 2013-11-05 LAB — WET PREP, GENITAL
Clue Cells Wet Prep HPF POC: NONE SEEN
TRICH WET PREP: NONE SEEN
YEAST WET PREP: NONE SEEN

## 2013-11-05 LAB — CBC
HEMATOCRIT: 38.7 % (ref 36.0–46.0)
Hemoglobin: 13.1 g/dL (ref 12.0–15.0)
MCH: 29.7 pg (ref 26.0–34.0)
MCHC: 33.9 g/dL (ref 30.0–36.0)
MCV: 87.8 fL (ref 78.0–100.0)
Platelets: 166 10*3/uL (ref 150–400)
RBC: 4.41 MIL/uL (ref 3.87–5.11)
RDW: 13.9 % (ref 11.5–15.5)
WBC: 7.6 10*3/uL (ref 4.0–10.5)

## 2013-11-05 LAB — POCT PREGNANCY, URINE: PREG TEST UR: NEGATIVE

## 2013-11-05 MED ORDER — ONDANSETRON 8 MG PO TBDP
8.0000 mg | ORAL_TABLET | Freq: Once | ORAL | Status: AC
  Administered 2013-11-05: 8 mg via ORAL
  Filled 2013-11-05: qty 1

## 2013-11-05 MED ORDER — OXYCODONE-ACETAMINOPHEN 5-325 MG PO TABS
2.0000 | ORAL_TABLET | Freq: Once | ORAL | Status: AC
  Administered 2013-11-05: 2 via ORAL
  Filled 2013-11-05: qty 2

## 2013-11-05 MED ORDER — PROMETHAZINE HCL 25 MG PO TABS: 25.0000 mg | ORAL_TABLET | Freq: Four times a day (QID) | ORAL | Status: DC | PRN

## 2013-11-05 MED ORDER — TRAMADOL HCL 50 MG PO TABS: 50.0000 mg | ORAL_TABLET | Freq: Four times a day (QID) | ORAL | Status: DC | PRN

## 2013-11-05 NOTE — MAU Note (Signed)
Pt reports she started bleeding today and having pelvic pain. Has not had a period for about 8 months has IUD in place. Had similar pain 2  Months ago Dr Tamela Oddi gave her antibiotic.

## 2013-11-05 NOTE — Discharge Instructions (Signed)

## 2013-11-05 NOTE — MAU Provider Note (Signed)
History     CSN: [id number]  Arrival date and time: 11/05/13 2050   None     Chief Complaint  Patient presents with  . Vaginal Bleeding  . Pelvic Pain   HPI 23 y.o. G1P0101 w/ onset of vaginal bleeding yesterday, pelvic pain starting tonight. + n/v. States she took Motrin 400 mg with no relief, then took 3 more 400 mg tablets at 30 minute intervals over about 1.5 hours w/ no relief. Skyla IUD in place. Pt states she had similar pain in June 2015, was evaluated by Dr. Tamela Oddi, treated for PID w/ levaquin and azithromycin. + BV at that time. GC/CT negative, U/S showed IUD in expected location. Pt requests to have IUD removed tonight.   Past Medical History  Diagnosis Date  . Polycystic ovary disease   . Depression   . Bipolar 1 disorder   . PTSD (post-traumatic stress disorder)     Past Surgical History  Procedure Laterality Date  . Ear examination under anesthesia      Family History  Problem Relation Age of Onset  . Heart disease Maternal Uncle   . Diabetes Maternal Grandmother     History  Substance Use Topics  . Smoking status: Current Some Day Smoker    Types: Cigarettes  . Smokeless tobacco: Never Used  . Alcohol Use: No    Allergies:  Allergies  Allergen Reactions  . Penicillins Hives    Rocephin w/out reaction.    Prescriptions prior to admission  Medication Sig Dispense Refill  . Levonorgestrel (SKYLA) 13.5 MG IUD 13.5 mg by Intrauterine route once.        Review of Systems  Constitutional: Negative.   Respiratory: Negative.   Cardiovascular: Negative.   Gastrointestinal: Positive for nausea, vomiting and abdominal pain. Negative for diarrhea and constipation.  Genitourinary: Negative for dysuria, urgency, frequency, hematuria and flank pain.       + bleeding  Musculoskeletal: Negative.   Neurological: Negative.   Psychiatric/Behavioral: Negative.    Physical Exam   Blood pressure 126/62, pulse 72, temperature 98.6 F (37 C),  temperature source Oral, resp. rate 18, height 6' (1.829 m), weight 218 lb (98.884 kg), not currently breastfeeding.  Physical Exam  Nursing note and vitals reviewed. Constitutional: She is oriented to person, place, and time. She appears well-developed and well-nourished. No distress.  HENT:  Head: Normocephalic and atraumatic.  Cardiovascular: Normal rate, regular rhythm and normal heart sounds.   Respiratory: Effort normal and breath sounds normal. No respiratory distress.  GI: Soft. Bowel sounds are normal. She exhibits no distension and no mass. There is no tenderness. There is no rebound and no guarding.  Genitourinary: There is no rash or lesion on the right labia. There is no rash or lesion on the left labia. Uterus is tender (mild). Uterus is not deviated, not enlarged and not fixed. Cervix exhibits no motion tenderness, no discharge and no friability. Right adnexum displays tenderness (mild). Right adnexum displays no mass and no fullness. Left adnexum displays tenderness (mild). Left adnexum displays no mass and no fullness. There is bleeding (moderate) around the vagina. No erythema or tenderness around the vagina. No vaginal discharge found.  Obesity limits exam accuracy; IUD strings noted   Neurological: She is alert and oriented to person, place, and time.  Skin: Skin is warm and dry.  Psychiatric: She has a normal mood and affect.    MAU Course  Procedures  Results for orders placed during the hospital encounter of 11/05/13 (from  the past 24 hour(s))  POCT PREGNANCY, URINE     Status: None   Collection Time    11/05/13  9:11 PM      Result Value Ref Range   Preg Test, Ur NEGATIVE  NEGATIVE  WET PREP, GENITAL     Status: Abnormal   Collection Time    11/05/13 10:10 PM      Result Value Ref Range   Yeast Wet Prep HPF POC NONE SEEN  NONE SEEN   Trich, Wet Prep NONE SEEN  NONE SEEN   Clue Cells Wet Prep HPF POC NONE SEEN  NONE SEEN   WBC, Wet Prep HPF POC FEW (*) NONE  SEEN  CBC     Status: None   Collection Time    11/05/13 10:45 PM      Result Value Ref Range   WBC 7.6  4.0 - 10.5 K/uL   RBC 4.41  3.87 - 5.11 MIL/uL   Hemoglobin 13.1  12.0 - 15.0 g/dL   HCT 78.4  69.6 - 29.5 %   MCV 87.8  78.0 - 100.0 fL   MCH 29.7  26.0 - 34.0 pg   MCHC 33.9  30.0 - 36.0 g/dL   RDW 28.4  13.2 - 44.0 %   Platelets 166  150 - 400 K/uL   Pain/nausea improved w/ Percocet 5/325 #2 and Zofran ODT 8 mg Pt asked during exam if I am going to give her an rx of Percocet to take home.   Assessment and Plan   1. Dysmenorrhea   2. IUD (intrauterine device) in place   Exam w/ mod menstrual type bleeding and mild diffuse tenderness, no CMT, not c/w PID, IUD strings at expected length, u/s 2 months ago during similar episode showing IUD in place. Will provide Ultram for pain and Phenergan for nausea, f/u in office for reeval and to discuss IUD removal if pt still desires.     Medication List         Levonorgestrel 13.5 MG Iud  Commonly known as:  SKYLA  13.5 mg by Intrauterine route once.     promethazine 25 MG tablet  Commonly known as:  PHENERGAN  Take 1 tablet (25 mg total) by mouth every 6 (six) hours as needed for nausea or vomiting.     traMADol 50 MG tablet  Commonly known as:  ULTRAM  Take 1 tablet (50 mg total) by mouth every 6 (six) hours as needed.            Follow-up Information   Schedule an appointment as soon as possible for a visit with Roseanna Rainbow, MD.   Specialty:  Obstetrics and Gynecology   Contact information:   [address] [place] [address] [phone]         Georges Mouse 11/05/2013, 11:09 PM

## 2013-11-08 ENCOUNTER — Encounter (HOSPITAL_COMMUNITY): Payer: Self-pay | Admitting: Emergency Medicine

## 2013-11-08 ENCOUNTER — Emergency Department (HOSPITAL_COMMUNITY)
Admission: EM | Admit: 2013-11-08 | Discharge: 2013-11-08 | Disposition: A | Discharge status: Home / Self Care | Payer: Medicaid Other | Attending: Emergency Medicine | Admitting: Emergency Medicine

## 2013-11-08 DIAGNOSIS — Z3202 Encounter for pregnancy test, result negative: Secondary | ICD-10-CM | POA: Insufficient documentation

## 2013-11-08 DIAGNOSIS — Z8659 Personal history of other mental and behavioral disorders: Secondary | ICD-10-CM | POA: Diagnosis not present

## 2013-11-08 DIAGNOSIS — R1110 Vomiting, unspecified: Secondary | ICD-10-CM

## 2013-11-08 DIAGNOSIS — Z8639 Personal history of other endocrine, nutritional and metabolic disease: Secondary | ICD-10-CM | POA: Insufficient documentation

## 2013-11-08 DIAGNOSIS — R112 Nausea with vomiting, unspecified: Secondary | ICD-10-CM | POA: Diagnosis not present

## 2013-11-08 DIAGNOSIS — F17200 Nicotine dependence, unspecified, uncomplicated: Secondary | ICD-10-CM | POA: Diagnosis not present

## 2013-11-08 DIAGNOSIS — N390 Urinary tract infection, site not specified: Secondary | ICD-10-CM | POA: Diagnosis not present

## 2013-11-08 DIAGNOSIS — N898 Other specified noninflammatory disorders of vagina: Secondary | ICD-10-CM | POA: Insufficient documentation

## 2013-11-08 DIAGNOSIS — Z862 Personal history of diseases of the blood and blood-forming organs and certain disorders involving the immune mechanism: Secondary | ICD-10-CM | POA: Insufficient documentation

## 2013-11-08 DIAGNOSIS — N739 Female pelvic inflammatory disease, unspecified: Secondary | ICD-10-CM | POA: Insufficient documentation

## 2013-11-08 DIAGNOSIS — Z880 Allergy status to penicillin: Secondary | ICD-10-CM | POA: Insufficient documentation

## 2013-11-08 LAB — URINE MICROSCOPIC-ADD ON

## 2013-11-08 LAB — GC/CHLAMYDIA PROBE AMP
CT PROBE, AMP APTIMA: POSITIVE — AB
GC PROBE AMP APTIMA: NEGATIVE

## 2013-11-08 LAB — URINALYSIS, ROUTINE W REFLEX MICROSCOPIC
Bilirubin Urine: NEGATIVE
Glucose, UA: NEGATIVE mg/dL
Ketones, ur: >80 mg/dL — AB
NITRITE: NEGATIVE
Protein, ur: 30 mg/dL — AB
SPECIFIC GRAVITY, URINE: 1.026 (ref 1.005–1.030)
Urobilinogen, UA: 1 mg/dL (ref 0.0–1.0)
pH: 7 (ref 5.0–8.0)

## 2013-11-08 LAB — COMPREHENSIVE METABOLIC PANEL
ALK PHOS: 67 U/L (ref 39–117)
ALT: 11 U/L (ref 0–35)
ANION GAP: 14 (ref 5–15)
AST: 16 U/L (ref 0–37)
Albumin: 4.6 g/dL (ref 3.5–5.2)
BUN: 12 mg/dL (ref 6–23)
CO2: 23 mEq/L (ref 19–32)
Calcium: 9.5 mg/dL (ref 8.4–10.5)
Chloride: 106 mEq/L (ref 96–112)
Creatinine, Ser: 0.83 mg/dL (ref 0.50–1.10)
GFR calc Af Amer: >90 mL/min (ref >90)
GFR calc non Af Amer: >90 mL/min (ref >90)
Glucose, Bld: 119 mg/dL — ABNORMAL HIGH (ref 70–99)
Potassium: 4.4 mEq/L (ref 3.7–5.3)
Sodium: 143 mEq/L (ref 137–147)
TOTAL PROTEIN: 7.9 g/dL (ref 6.0–8.3)
Total Bilirubin: 0.5 mg/dL (ref 0.3–1.2)

## 2013-11-08 LAB — CBC WITH DIFFERENTIAL/PLATELET
BASOS PCT: 0 % (ref 0–1)
Basophils Absolute: 0 10*3/uL (ref 0.0–0.1)
EOS ABS: 0.2 10*3/uL (ref 0.0–0.7)
Eosinophils Relative: 2 % (ref 0–5)
HCT: 44.2 % (ref 36.0–46.0)
HEMOGLOBIN: 15.2 g/dL — AB (ref 12.0–15.0)
Lymphocytes Relative: 37 % (ref 12–46)
Lymphs Abs: 3.8 10*3/uL (ref 0.7–4.0)
MCH: 29.6 pg (ref 26.0–34.0)
MCHC: 34.4 g/dL (ref 30.0–36.0)
MCV: 86.2 fL (ref 78.0–100.0)
MONOS PCT: 5 % (ref 3–12)
Monocytes Absolute: 0.5 10*3/uL (ref 0.1–1.0)
Neutro Abs: 5.8 10*3/uL (ref 1.7–7.7)
Neutrophils Relative %: 56 % (ref 43–77)
PLATELETS: 169 10*3/uL (ref 150–400)
RBC: 5.13 MIL/uL — ABNORMAL HIGH (ref 3.87–5.11)
RDW: 13.7 % (ref 11.5–15.5)
WBC: 10.4 10*3/uL (ref 4.0–10.5)

## 2013-11-08 LAB — PREGNANCY, URINE: Preg Test, Ur: NEGATIVE

## 2013-11-08 LAB — LIPASE, BLOOD: LIPASE: 46 U/L (ref 11–59)

## 2013-11-08 MED ORDER — AZITHROMYCIN 250 MG PO TABS
1000.0000 mg | ORAL_TABLET | Freq: Once | ORAL | Status: AC
  Administered 2013-11-08: 1000 mg via ORAL
  Filled 2013-11-08: qty 4

## 2013-11-08 MED ORDER — HYDROMORPHONE HCL PF 1 MG/ML IJ SOLN
1.0000 mg | Freq: Once | INTRAMUSCULAR | Status: AC
  Administered 2013-11-08: 1 mg via INTRAVENOUS
  Filled 2013-11-08: qty 1

## 2013-11-08 MED ORDER — ONDANSETRON HCL 4 MG/2ML IJ SOLN
4.0000 mg | Freq: Once | INTRAMUSCULAR | Status: AC
  Administered 2013-11-08: 4 mg via INTRAVENOUS
  Filled 2013-11-08: qty 2

## 2013-11-08 MED ORDER — ONDANSETRON HCL 4 MG PO TABS: 4.0000 mg | ORAL_TABLET | Freq: Four times a day (QID) | ORAL | Status: DC

## 2013-11-08 MED ORDER — CIPROFLOXACIN HCL 500 MG PO TABS: 500.0000 mg | ORAL_TABLET | Freq: Two times a day (BID) | ORAL | Status: DC

## 2013-11-08 MED ORDER — SODIUM CHLORIDE 0.9 % IV BOLUS (SEPSIS)
1000.0000 mL | Freq: Once | INTRAVENOUS | Status: AC
  Administered 2013-11-08: 1000 mL via INTRAVENOUS

## 2013-11-08 MED ORDER — PROMETHAZINE HCL 25 MG/ML IJ SOLN
25.0000 mg | Freq: Once | INTRAMUSCULAR | Status: AC
  Administered 2013-11-08: 25 mg via INTRAVENOUS
  Filled 2013-11-08: qty 1

## 2013-11-08 MED ORDER — TRAMADOL HCL 50 MG PO TABS: 50.0000 mg | ORAL_TABLET | Freq: Four times a day (QID) | ORAL | Status: DC | PRN

## 2013-11-08 MED ORDER — DOXYCYCLINE HYCLATE 100 MG PO CAPS: 100.0000 mg | ORAL_CAPSULE | Freq: Two times a day (BID) | ORAL | Status: DC

## 2013-11-08 MED ORDER — DEXTROSE 5 % IV SOLN
1.0000 g | Freq: Once | INTRAVENOUS | Status: AC
  Administered 2013-11-08: 1 g via INTRAVENOUS
  Filled 2013-11-08: qty 10

## 2013-11-08 NOTE — ED Notes (Signed)
Pt placed on monitor upon return to room. Pt continues to be monitored by blood pressure and pulse ox. Pt was not able to provide urine specimen pt stated " She peed in the toilet and not in the cup"

## 2013-11-08 NOTE — ED Notes (Signed)
Pt remains monitored by blood pressure and pulse ox. Pts family remains at bedside.  

## 2013-11-08 NOTE — ED Notes (Signed)
Pt has requested pain meds. Ambulated to restroom with no issue.

## 2013-11-08 NOTE — Discharge Instructions (Signed)
You have been treated in the emergency department for an infection, possibly sexually transmitted. Results of your gonorrhea and chlamydia tests are pending and you will be notified if they are positive. It is very important to practice safe sex and use condoms/or dental dams when sexually active. If your results are positive you need to notify all sexual partners so they can be treated as well. The website [email or web address].net/ can be used to send anonymous text messages or emails to alert sexual contacts. Follow up with your doctor, or OBGYN in regards to today's visit.    Gonorrhea and Chlamydia SYMPTOMS  In females, symptoms may go unnoticed. Symptoms that are more noticeable can include:  Belly (abdominal) pain.  Painful intercourse.  Watery mucous-like discharge from the vagina.  Miscarriage.  Discomfort when urinating.  Inflammation of the rectum.  Abnormal gray-green frothy vaginal discharge  Vaginal itching and irritatio  Itching and irritation of the area outside the vagina.   Painful urination.  Bleeding after sexual intercourse.  In males, symptoms include:  Burning with urination.  Pain in the testicles.  Watery mucous-like discharge from the penis.  It can cause longstanding (chronic) pelvic pain after frequent infections.  TREATMENT  PID can cause women to not be able to have children (sterile) if left untreated or if half-treated.  It is important to finish ALL medications given to you.  This is a sexually transmitted infection. So you are also at risk for other sexually transmitted diseases, including HIV (AIDS), it is recommended that you get tested. HOME CARE INSTRUCTIONS  Warning: This infection is contagious. Do not have sex until treatment is completed. Follow up at your caregiver's office or the clinic to which you were referred. If your diagnosis (learning what is wrong) is confirmed by culture or some other method, your recent sexual contacts need treatment.  Even if they are symptom free or have a negative culture or evaluation, they should be treated.  PREVENTION  Women should use sanitary pads instead of tampons for vaginal discharge.  Wipe front to back after using the toilet and avoid douching.   Practice safe sex, use condoms, have only one sex partner and be sure your sex partner is not having sex with others.  Ask your caregiver to test you for chlamydia at your regular checkups or sooner if you are having symptoms.  Ask for further information if you are pregnant.  SEEK IMMEDIATE MEDICAL CARE IF:  You develop an oral temperature above 102 F (38.9 C), not controlled by medications or lasting more than 2 days.  You develop an increase in pain.  You develop any type of abnormal discharge.  You develop vaginal bleeding and it is not time for your period.  You develop painful intercourse.   Bacterial Vaginosis  Bacterial vaginosis (BV) is a vaginal infection where the normal balance of bacteria in the vagina is disrupted. This is not a sexually transmitted disease and your sexual partners do NOT need to be treated. CAUSES  The cause of BV is not fully understood. BV develops when there is an increase or imbalance of harmful bacteria.  Some activities or behaviors can upset the normal balance of bacteria in the vagina and put women at increased risk including:  Having a new sex partner or multiple sex partners.  Douching.  Using an intrauterine device (IUD) for contraception.  It is not clear what role sexual activity plays in the development of BV. However, women that have never had sexual intercourse  are rarely infected with BV.  Women do not get BV from toilet seats, bedding, swimming pools or from touching objects around them.   SYMPTOMS  Grey vaginal discharge.  A fish-like odor with discharge, especially after sexual intercourse.  Itching or burning of the vagina and vulva.  Burning or pain with urination.  Some women have no  signs or symptoms at all.   TREATMENT  Sometimes BV will clear up without treatment.  BV may be treated with antibiotics.  BV can recur after treatment. If this happens, a second round of antibiotics will often be prescribed.  HOME CARE INSTRUCTIONS  Finish all medication as directed by your caregiver.  Do not have sex until treatment is completed.  Do NOT drink any alcoholic beverages while being treated  with Metronidazole (Flagyl). This will cause a severe reaction inducing vomiting.  RESOURCE GUIDE  Dental Problems  Patients with Medicaid: [facility] [phone] [address]                                           [phone] [address] Phone:  [phone]                                                  Phone:  [phone]  If unable to pay or uninsured, contact:  [facility] or [facility] to become qualified for the adult dental clinic.  Chronic Pain Problems Contact [facility]  [phone] Patients need to be referred by their primary care doctor.  Insufficient Money for Medicine Contact [employer]:  call "211" or [facility] ([phone].  No Primary Care Doctor Call [facility]  [phone] Other agencies that provide inexpensive medical care    [facility]  [phone]    [facility]  [phone]    [facility]  [phone]    [facility]  [phone]    [facility]  [phone]    [facility]  [phone]  Psychological Services [facility]  [phone] [facility] Services  [phone] [facility]   [phone] (emergency services [phone])  Substance Abuse Resources [facility]  [phone] [facility] [phone] [facility] [phone] Floydene Flock [phone] Residential & Outpatient Substance Abuse Program  [phone]  Abuse/Neglect [facility] Child  Abuse Hotline [phone] [facility] Child Abuse Hotline 9053965714 (After Hours)  Emergency Shelter [facility] [phone]  [facility] [place] [facility] [phone] [facility] Services [phone]  MRSA Hotline #:   4101175828    [facility] Resources  [facility] [place]     [employer]                          [facility] Dept. [address] [place]                       [address]      [address]                                                Cristobal Goldmann Phone:  [phone]                                   Phone:  [phone]                 Phone:  [phone]  [facility] Mental Health Phone:  [phone]  [facility] Child Abuse Hotline [phone] (After Hours)

## 2013-11-08 NOTE — ED Notes (Signed)
Pt discharged home with all belongings, pt alert, oriented and ambulatory to wheel chair, escorted to exit via wheel chair, 4 new RX prescribed, pt verbalizes understanding of discharge instructions. Pt a/o x4, pt driven home by friend at bedside

## 2013-11-08 NOTE — ED Notes (Signed)
Pt remains monitored by blood pressure and pulse ox. pts family at bedside.  

## 2013-11-08 NOTE — ED Provider Notes (Signed)
CSN: [id number]     Arrival date & time 11/08/13  1242 History   First MD Initiated Contact with Patient 11/08/13 1430     Chief Complaint  Patient presents with  . Emesis  . Vaginal Bleeding     (Consider location/radiation/quality/duration/timing/severity/associated sxs/prior Treatment) HPI Comments: The patient is a 23 year old female with past history of polycystic ovarian disease, depression, bipolar presents emergency room chief complaint of lower abdominal pain for several days. The patient reports constant sharp discomfort low abdominal pain, equal bilaterally. She reports 5 episodes of vomiting today. She denies fever, chills, dysuria, hematuria. Denies recent travel, known sick contacts, use of EtOH. She reports she [facility], has not call for a followup appointment or fill her prescription of Ultram. Denies abnormal discharge. She reports taking Aleve today without resolution of symptoms. IUD placed 2014, no previous issues in the past. Roseanna Rainbow, MD  Patient is a 23 y.o. female presenting with vomiting and vaginal bleeding. The history is provided by the patient. No language interpreter was used.  Emesis Associated symptoms: abdominal pain   Associated symptoms: no chills and no diarrhea   Vaginal Bleeding Associated symptoms: abdominal pain and nausea   Associated symptoms: no dysuria, no fever and no vaginal discharge     Past Medical History  Diagnosis Date  . Polycystic ovary disease   . Depression   . Bipolar 1 disorder   . PTSD (post-traumatic stress disorder)    Past Surgical History  Procedure Laterality Date  . Ear examination under anesthesia     Family History  Problem Relation Age of Onset  . Heart disease Maternal Uncle   . Diabetes Maternal Grandmother    History  Substance Use Topics  . Smoking status: Current Some Day Smoker    Types: Cigarettes  . Smokeless tobacco: Never Used  .  Alcohol Use: No   OB History   Grav Para Term Preterm Abortions TAB SAB Ect Mult Living   Review of Systems  Constitutional: Negative for fever and chills.  Gastrointestinal: Positive for nausea, vomiting and abdominal pain. Negative for diarrhea and constipation.  Genitourinary: Positive for vaginal bleeding. Negative for dysuria and vaginal discharge.      Allergies  Penicillins  Home Medications   Prior to Admission medications   Medication Sig Start Date End Date Taking? Authorizing Provider  Acetaminophen (TYLENOL PO) Take 2 tablets by mouth daily as needed (for pain).   Yes Historical Provider, MD  ibuprofen (ADVIL,MOTRIN) 200 MG tablet Take 400 mg by mouth every 6 (six) hours as needed for moderate pain.   Yes Historical Provider, MD  Levonorgestrel (SKYLA) 13.5 MG IUD 13.5 mg by Intrauterine route once. 11/26/12  Yes Antionette Char, MD   BP 141/86  Pulse 65  Temp(Src) 98.6 F (37 C) (Oral)  Resp 20  Ht 6' (1.829 m)  Wt 218 lb (98.884 kg)  BMI 29.56 kg/m2  SpO2 100% Physical Exam  Nursing note and vitals reviewed. Constitutional: She appears well-developed and well-nourished.  Patient sleeping upon entering the room.  HENT:  Head: Normocephalic and atraumatic.  Eyes: EOM are normal.  Neck: Neck supple.  Cardiovascular: Normal rate and regular rhythm.   Pulmonary/Chest: Effort normal. No respiratory distress. She has no wheezes.  Abdominal: Soft. She exhibits no distension. There is tenderness in the right lower quadrant, suprapubic area and left lower quadrant. There  is no rebound, no guarding, no CVA tenderness, no tenderness at McBurney's point and negative Murphy's sign.  Mild to moderate lower abdominal tenderness palpation  Musculoskeletal: Normal range of motion.  Neurological: She is alert.  Skin: Skin is warm. She is not diaphoretic.  Psychiatric: She has a normal mood and affect. Her behavior is normal.    ED Course   Procedures (including critical care time) Labs Review Results for orders placed during the hospital encounter of 11/08/13  CBC WITH DIFFERENTIAL      Result Value Ref Range   WBC 10.4  4.0 - 10.5 K/uL   RBC 5.13 (*) 3.87 - 5.11 MIL/uL   Hemoglobin 15.2 (*) 12.0 - 15.0 g/dL   HCT 40.9  81.1 - 91.4 %   MCV 86.2  78.0 - 100.0 fL   MCH 29.6  26.0 - 34.0 pg   MCHC 34.4  30.0 - 36.0 g/dL   RDW 78.2  95.6 - 21.3 %   Platelets 169  150 - 400 K/uL   Neutrophils Relative % 56  43 - 77 %   Neutro Abs 5.8  1.7 - 7.7 K/uL   Lymphocytes Relative 37  12 - 46 %   Lymphs Abs 3.8  0.7 - 4.0 K/uL   Monocytes Relative 5  3 - 12 %   Monocytes Absolute 0.5  0.1 - 1.0 K/uL   Eosinophils Relative 2  0 - 5 %   Eosinophils Absolute 0.2  0.0 - 0.7 K/uL   Basophils Relative 0  0 - 1 %   Basophils Absolute 0.0  0.0 - 0.1 K/uL  COMPREHENSIVE METABOLIC PANEL      Result Value Ref Range   Sodium 143  137 - 147 mEq/L   Potassium 4.4  3.7 - 5.3 mEq/L   Chloride 106  96 - 112 mEq/L   CO2 23  19 - 32 mEq/L   Glucose, Bld 119 (*) 70 - 99 mg/dL   BUN 12  6 - 23 mg/dL   Creatinine, Ser 0.86  0.50 - 1.10 mg/dL   Calcium 9.5  8.4 - 57.8 mg/dL   Total Protein 7.9  6.0 - 8.3 g/dL   Albumin 4.6  3.5 - 5.2 g/dL   AST 16  0 - 37 U/L   ALT 11  0 - 35 U/L   Alkaline Phosphatase 67  39 - 117 U/L   Total Bilirubin 0.5  0.3 - 1.2 mg/dL   GFR calc non Af Amer >90  >90 mL/min   GFR calc Af Amer >90  >90 mL/min   Anion gap 14  5 - 15  LIPASE, BLOOD      Result Value Ref Range   Lipase 46  11 - 59 U/L  URINALYSIS, ROUTINE W REFLEX MICROSCOPIC      Result Value Ref Range   Color, Urine AMBER (*) YELLOW   APPearance CLOUDY (*) CLEAR   Specific Gravity, Urine 1.026  1.005 - 1.030   pH 7.0  5.0 - 8.0   Glucose, UA NEGATIVE  NEGATIVE mg/dL   Hgb urine dipstick MODERATE (*) NEGATIVE   Bilirubin Urine NEGATIVE  NEGATIVE   Ketones, ur >80 (*) NEGATIVE mg/dL   Protein, ur 30 (*) NEGATIVE mg/dL   Urobilinogen, UA 1.0  0.0  - 1.0 mg/dL   Nitrite NEGATIVE  NEGATIVE   Leukocytes, UA MODERATE (*) NEGATIVE  URINE MICROSCOPIC-ADD ON      Result Value Ref Range   Squamous Epithelial / LPF  MANY (*) RARE   WBC, UA 21-50  <3 WBC/hpf   RBC / HPF 7-10  <3 RBC/hpf   Bacteria, UA MANY (*) RARE   Urine-Other MUCOUS PRESENT     Imaging Review No results found.   EKG Interpretation None      MDM   Final diagnoses:  PID (pelvic inflammatory disease)  Vomiting in adult  UTI (lower urinary tract infection)   Patient presents with lower abdominal pain and vomiting. [facility] seen at [facility] for vaginal bleeding with IUD, has not call for a followup appointment has not filled tramadol as prescribed. Patient has mild abdominal discomfort, afebrile in no acute distress. Negative pregnancy 3 days ago will not repeat. Labs ordered, UA sent, pain medication and nausea medication given. Patients symptoms likely viral gastritis.  Per EMR the patient was recently seen at [facility], diagnosed with abnormal vaginal bleeding and told to follow up, No concern for cervicitis at that time, will not to repeat pelvic time. Is afebrile, no leukocytosis, CMP without concerning abnormalities lipase negative CBC shows elevation of hemoglobin. Do not fell as though a repeat pelvic exam is indicated at this time, doubt TOA or torsion.  5:15 PM Patient sleeping comfortably in room, denies further emesis, denies abdominal pain. Discussed lab results waiting UA results at this time. Further review of EMR shows positive Chlamydia we'll treat for PID given wrist fracture of IUD and persistent worsening lower abdominal pain. Urine questionable UTI vs contaminant will culture and treat for UTI, given symptoms. 5:31 PM Discussed positive Chlamydia test 3 days ago at women[facility] with the patient plan to treat for PID and UTI. Discussed notifying all sexual partners for treatment and testing. Discussed lab results, and treatment plan with the patient. Return  precautions given. Reports understanding and no other concerns at this time.  Patient is stable for discharge at this time.   Meds given in ED:  Medications  cefTRIAXone (ROCEPHIN) 1 g in dextrose 5 % 50 mL IVPB (not administered)  azithromycin (ZITHROMAX) tablet 1,000 mg (not administered)  ondansetron (ZOFRAN) injection 4 mg (4 mg Intravenous Given 11/08/13 1306)  sodium chloride 0.9 % bolus 1,000 mL (1,000 mLs Intravenous New Bag/Given 11/08/13 1515)  promethazine (PHENERGAN) injection 25 mg (25 mg Intravenous Given 11/08/13 1515)  HYDROmorphone (DILAUDID) injection 1 mg (1 mg Intravenous Given 11/08/13 1527)    New Prescriptions   CIPROFLOXACIN (CIPRO) 500 MG TABLET    Take 1 tablet (500 mg total) by mouth 2 (two) times daily.   DOXYCYCLINE (VIBRAMYCIN) 100 MG CAPSULE    Take 1 capsule (100 mg total) by mouth 2 (two) times daily.   ONDANSETRON (ZOFRAN) 4 MG TABLET    Take 1 tablet (4 mg total) by mouth every 6 (six) hours.   TRAMADOL (ULTRAM) 50 MG TABLET    Take 1 tablet (50 mg total) by mouth every 6 (six) hours as needed.      Mellody Drown, PA-C 11/09/13 1038

## 2013-11-08 NOTE — ED Notes (Signed)
Pt continues to be monitored by blood pressure and pulse ox. Pt provided with [place] sandwich and gingerale.

## 2013-11-08 NOTE — ED Notes (Signed)
To ED via [facility] from home, with forceful dry heaves and vomiting-- was seen at [facility] days ago for same and c/o about IUD pain. abd pain started 1 week ago--

## 2013-11-08 NOTE — ED Notes (Signed)
Ambulated to restroom  

## 2013-11-08 NOTE — ED Notes (Signed)
Pt placed into gown and on monitor upon arrival to room. Pt monitored by blood pressure and pulse ox. Pts family  remains at bedside.  

## 2013-11-08 NOTE — ED Notes (Signed)
ED PA at bedside

## 2013-11-10 LAB — URINE CULTURE: Colony Count: >=100000

## 2013-11-10 NOTE — ED Provider Notes (Signed)
Medical screening examination/treatment/procedure(s) were performed by non-physician practitioner and as supervising physician I was immediately available for consultation/collaboration.   EKG Interpretation None        Shantia Sanford, MD 11/10/13 0940 

## 2014-01-10 ENCOUNTER — Encounter (HOSPITAL_COMMUNITY): Payer: Self-pay | Admitting: Emergency Medicine

## 2014-03-07 ENCOUNTER — Encounter: Payer: Self-pay | Admitting: *Deleted

## 2014-03-08 ENCOUNTER — Encounter: Payer: Self-pay | Admitting: Obstetrics & Gynecology

## 2015-03-12 NOTE — L&D Delivery Note (Signed)
Delivery Note Admitted in active labor, AROM @ time of delivery- lg amt clear fluid, reduced anterior lip, and at 3:34 PM a viable female was delivered via Vaginal, Spontaneous Delivery (Presentation:LOA, restituted to ROA).  Vigorous Infant placed directly on mom's abdomen for bonding/skin-to-skin. Delayed cord clamping, then cord clamped x 2, and cut by pt's mom.  APGAR: please refer to delivery summary. Weight: pending at time of note.   Placenta status: Intact, Spontaneous.  Cord:  with the following complications: short cord, ~30cm. Yolk sac remnant seen.  Cord pH: n/a  Anesthesia: None  Episiotomy: none  Lacerations:  none Suture Repair: n/a Est. Blood Loss (mL):  250ml  Mom to postpartum.  Baby to Couplet care / Skin to Skin. Breastfeeding, undecided about contraception, outpatient circ  Marge DuncansBooker, [place] Neisen Randall 09/16/2015, 3:45 PM

## 2015-03-14 LAB — OB RESULTS CONSOLE GC/CHLAMYDIA
Chlamydia: NEGATIVE
Gonorrhea: NEGATIVE

## 2015-06-29 LAB — OB RESULTS CONSOLE HGB/HCT, BLOOD
HCT: 36 %
Hemoglobin: 12 g/dL

## 2015-08-10 LAB — OB RESULTS CONSOLE HGB/HCT, BLOOD
HEMATOCRIT: 36 %
Hemoglobin: 12.3 g/dL

## 2015-08-10 LAB — OB RESULTS CONSOLE PLATELET COUNT: Platelets: 111 10*3/uL

## 2015-08-15 ENCOUNTER — Inpatient Hospital Stay (HOSPITAL_COMMUNITY)
Admission: AD | Admit: 2015-08-15 | Discharge: 2015-08-15 | Disposition: A | Discharge status: Home / Self Care | Payer: Medicaid Other | Source: Ambulatory Visit | Attending: Obstetrics and Gynecology | Admitting: Obstetrics and Gynecology

## 2015-08-15 ENCOUNTER — Encounter (HOSPITAL_COMMUNITY): Payer: Self-pay | Admitting: *Deleted

## 2015-08-15 DIAGNOSIS — F4310 Post-traumatic stress disorder, unspecified: Secondary | ICD-10-CM | POA: Diagnosis not present

## 2015-08-15 DIAGNOSIS — Z87891 Personal history of nicotine dependence: Secondary | ICD-10-CM | POA: Insufficient documentation

## 2015-08-15 DIAGNOSIS — O479 False labor, unspecified: Secondary | ICD-10-CM

## 2015-08-15 DIAGNOSIS — N760 Acute vaginitis: Secondary | ICD-10-CM

## 2015-08-15 DIAGNOSIS — F319 Bipolar disorder, unspecified: Secondary | ICD-10-CM | POA: Insufficient documentation

## 2015-08-15 DIAGNOSIS — O99343 Other mental disorders complicating pregnancy, third trimester: Secondary | ICD-10-CM | POA: Diagnosis not present

## 2015-08-15 DIAGNOSIS — Z3A34 34 weeks gestation of pregnancy: Secondary | ICD-10-CM | POA: Insufficient documentation

## 2015-08-15 DIAGNOSIS — B9689 Other specified bacterial agents as the cause of diseases classified elsewhere: Secondary | ICD-10-CM

## 2015-08-15 DIAGNOSIS — O4703 False labor before 37 completed weeks of gestation, third trimester: Secondary | ICD-10-CM | POA: Diagnosis present

## 2015-08-15 HISTORY — DX: Trichomoniasis, unspecified: A59.9

## 2015-08-15 HISTORY — DX: Gonococcal infection, unspecified: A54.9

## 2015-08-15 LAB — WET PREP, GENITAL
Sperm: NONE SEEN
TRICH WET PREP: NONE SEEN

## 2015-08-15 LAB — URINALYSIS, ROUTINE W REFLEX MICROSCOPIC
Bilirubin Urine: NEGATIVE
Glucose, UA: NEGATIVE mg/dL
Hgb urine dipstick: NEGATIVE
KETONES UR: NEGATIVE mg/dL
NITRITE: NEGATIVE
Protein, ur: NEGATIVE mg/dL
Specific Gravity, Urine: 1.01 (ref 1.005–1.030)
pH: 5.5 (ref 5.0–8.0)

## 2015-08-15 LAB — OB RESULTS CONSOLE GBS: GBS: NEGATIVE

## 2015-08-15 LAB — URINE MICROSCOPIC-ADD ON: RBC / HPF: NONE SEEN RBC/hpf (ref 0–5)

## 2015-08-15 MED ORDER — METRONIDAZOLE 500 MG PO TABS: 500.0000 mg | ORAL_TABLET | Freq: Two times a day (BID) | ORAL | Status: DC

## 2015-08-15 NOTE — MAU Note (Addendum)
Pt came in by EMS, C/O uc's since 1300.  Denies bleeding or LOF.  Has just moved here, [place]

## 2015-08-15 NOTE — MAU Provider Note (Signed)
MAU HISTORY AND PHYSICAL  Chief Complaint:  Labor Eval   Candace Richardson is a 25 y.o.  G2P0101 with IUP at 4510w4d presenting for Labor Eval Prenatal risk factors: h/o vaginal bleeding in 3rd trimester, recently moved from [place] 1 week ago ([facility] Pt states that today at ~1PM began to have irregular contractions, currently denies any.  Has had prior episodes.  Of note was discharged recently from hospital in [place] for vaginal bleeding.   Patient states she has been having  irregular, every 10 minutes contractions, none vaginal bleeding, intact membranes, with active fetal movement.    ROS: Denies any HA, CP, SOB, VB, LOF, urinary symptoms, fever, n/v  Past Medical History  Diagnosis Date  . Polycystic ovary disease   . Depression   . Bipolar 1 disorder (HCC)   . PTSD (post-traumatic stress disorder)   . Trichomonas infection   . Gonorrhea     Past Surgical History  Procedure Laterality Date  . Ear examination under anesthesia    . Wisdom tooth extraction      Family History  Problem Relation Age of Onset  . Heart disease Maternal Uncle   . Diabetes Maternal Grandmother     Social History  Substance Use Topics  . Smoking status: Former Smoker    Types: Cigarettes  . Smokeless tobacco: Never Used  . Alcohol Use: No    Allergies  Allergen Reactions  . Penicillins Hives and Other (See Comments)    Rocephin w/out reaction. Has patient had a PCN reaction causing immediate rash, facial/tongue/throat swelling, SOB or lightheadedness with hypotension: yes Has patient had a PCN reaction causing severe rash involving mucus membranes or skin necrosis: No Has patient had a PCN reaction that required hospitalization No Has patient had a PCN reaction occurring within the last 10 years: No If all of the above answers are "NO", then may proceed with Cephalosporin use.     Prescriptions prior to admission  Medication Sig Dispense Refill Last Dose   . Prenatal Vit-Fe Fumarate-FA (PRENATAL MULTIVITAMIN) TABS tablet Take 1 tablet by mouth daily at 12 noon.   08/14/2015 at Unknown time    Review of Systems - Negative except for what is mentioned in HPI.  Physical Exam  Blood pressure 131/66, pulse 64, temperature 97.8 F (36.6 C), temperature source Oral, resp. rate 20, height 6' (1.829 m), weight 204 lb (92.534 kg). GENERAL: Well-developed, well-nourished female in no acute distress.  LUNGS: Clear to auscultation bilaterally.  HEART: Regular rate and rhythm. ABDOMEN: Soft, nontender, nondistended, gravid.  EXTREMITIES: Nontender, no edema, 2+ distal pulses. Cervical Exam: 0.5/50/posterior Presentation: cephalic FHT:  Baseline 145 (moving baseline), mod variability, +acels/ no decels-reactive Contractions: None   Labs: Results for orders placed or performed during the hospital encounter of 08/15/15 (from the past 24 hour(s))  Urinalysis, Routine w reflex microscopic ([facility])   Collection Time: 08/15/15  2:28 PM  Result Value Ref Range   Color, Urine YELLOW YELLOW   APPearance CLEAR CLEAR   Specific Gravity, Urine 1.010 1.005 - 1.030   pH 5.5 5.0 - 8.0   Glucose, UA NEGATIVE NEGATIVE mg/dL   Hgb urine dipstick NEGATIVE NEGATIVE   Bilirubin Urine NEGATIVE NEGATIVE   Ketones, ur NEGATIVE NEGATIVE mg/dL   Protein, ur NEGATIVE NEGATIVE mg/dL   Nitrite NEGATIVE NEGATIVE   Leukocytes, UA MODERATE (A) NEGATIVE  Urine microscopic-add on   Collection Time: 08/15/15  2:28 PM  Result Value Ref Range  Squamous Epithelial / LPF 6-30 (A) NONE SEEN   WBC, UA 6-30 0 - 5 WBC/hpf   RBC / HPF NONE SEEN 0 - 5 RBC/hpf   Bacteria, UA FEW (A) NONE SEEN    Imaging Studies:  No results found.  Assessment: Candace Richardson is  25 y.o. G2P0101 at [redacted]w[redacted]d presents with Labor Eval .  Plan: #False labor -UA negative -Wet prep pending -GBS collected, f/u results with Ob PCP -F/u to establish OB PCP in this week -Discussed labor  precautions - nst reactive -D/c to home  Olena Leatherwood 6/6/20174:11 PM  OB FELLOW MAU DISCHARGE ATTESTATION  I have seen and examined this patient; I agree with above documentation in the resident's note.    Silvano Bilis, MD 9:56 PM

## 2015-08-15 NOTE — Discharge Instructions (Signed)

## 2015-08-16 LAB — GC/CHLAMYDIA PROBE AMP (~~LOC~~) NOT AT ARMC
Chlamydia: NEGATIVE
Neisseria Gonorrhea: NEGATIVE

## 2015-08-18 LAB — CULTURE, BETA STREP (GROUP B ONLY)

## 2015-08-21 ENCOUNTER — Encounter: Payer: Self-pay | Admitting: *Deleted

## 2015-08-24 ENCOUNTER — Ambulatory Visit (INDEPENDENT_AMBULATORY_CARE_PROVIDER_SITE_OTHER): Payer: Medicaid Other | Admitting: Certified Nurse Midwife

## 2015-08-24 ENCOUNTER — Encounter: Payer: Self-pay | Admitting: Certified Nurse Midwife

## 2015-08-24 VITALS — BP 124/74 | HR 74

## 2015-08-24 DIAGNOSIS — O0991 Supervision of high risk pregnancy, unspecified, first trimester: Secondary | ICD-10-CM

## 2015-08-24 DIAGNOSIS — O99613 Diseases of the digestive system complicating pregnancy, third trimester: Secondary | ICD-10-CM | POA: Diagnosis not present

## 2015-08-24 DIAGNOSIS — K5901 Slow transit constipation: Secondary | ICD-10-CM

## 2015-08-24 DIAGNOSIS — Z1389 Encounter for screening for other disorder: Secondary | ICD-10-CM | POA: Diagnosis not present

## 2015-08-24 DIAGNOSIS — Z331 Pregnant state, incidental: Secondary | ICD-10-CM | POA: Diagnosis not present

## 2015-08-24 DIAGNOSIS — O0993 Supervision of high risk pregnancy, unspecified, third trimester: Secondary | ICD-10-CM

## 2015-08-24 DIAGNOSIS — O09293 Supervision of pregnancy with other poor reproductive or obstetric history, third trimester: Secondary | ICD-10-CM | POA: Diagnosis not present

## 2015-08-24 DIAGNOSIS — K219 Gastro-esophageal reflux disease without esophagitis: Secondary | ICD-10-CM

## 2015-08-24 LAB — POCT URINALYSIS DIPSTICK
BILIRUBIN UA: NEGATIVE
Blood, UA: NEGATIVE
Glucose, UA: NEGATIVE
Ketones, UA: NEGATIVE
Leukocytes, UA: NEGATIVE
NITRITE UA: NEGATIVE
Protein, UA: NEGATIVE
SPEC GRAV UA: 1.025
Urobilinogen, UA: NEGATIVE
pH, UA: 5

## 2015-08-24 MED ORDER — VITAFOL GUMMIES 3.33-0.333-34.8 MG PO CHEW: 3.0000 | CHEWABLE_TABLET | Freq: Every day | ORAL | Status: DC

## 2015-08-24 MED ORDER — POLYETHYLENE GLYCOL 3350 17 GM/SCOOP PO POWD: 17.0000 g | Freq: Every day | ORAL | Status: DC

## 2015-08-24 MED ORDER — OMEPRAZOLE 20 MG PO CPDR: 20.0000 mg | DELAYED_RELEASE_CAPSULE | Freq: Two times a day (BID) | ORAL | Status: DC

## 2015-08-24 MED ORDER — DOCUSATE SODIUM 100 MG PO CAPS: 100.0000 mg | ORAL_CAPSULE | Freq: Two times a day (BID) | ORAL | Status: DC

## 2015-08-24 NOTE — Progress Notes (Signed)
Subjective:    Candace Richardson is being seen today for her first obstetrical visit.  This is not a planned pregnancy. She is at 274w6d gestation. Her obstetrical history is significant for previous pre-term delivery @ 30 weeks.. Relationship with FOB: not involved. Patient does intend to breast feed. Pregnancy history fully reviewed.  1st child born at 30 weeks.  Noticed she was pregnant @ 5 weeks, 01-19-2015.  [place] here from [place], [place].  Reports taking Nifedipine TID, with occasional missed doses.  Reports cramping at night, occasional contractions, lots of heart burn, severe back pain that feels like "back labor", occasional vaginal clear non-odorous leaking, vaginal pressure, constipation.  Reports current prenatal vitamins make her sick.  Willing to try gummy prenatal vitamins.  The information documented in the HPI was reviewed and verified.  Menstrual History: OB History    Gravida Para Term Preterm AB TAB SAB Ectopic Multiple Living   2 1  1      1       Menarche age: 89 or 25 years old  No LMP recorded. Patient is pregnant.    Past Medical History  Diagnosis Date  . Polycystic ovary disease   . Depression   . Bipolar 1 disorder (HCC)   . PTSD (post-traumatic stress disorder)   . Trichomonas infection   . Gonorrhea     Past Surgical History  Procedure Laterality Date  . Ear examination under anesthesia    . Wisdom tooth extraction       (Not in a hospital admission) Allergies  Allergen Reactions  . Penicillins Hives and Other (See Comments)    Rocephin w/out reaction. Has patient had a PCN reaction causing immediate rash, facial/tongue/throat swelling, SOB or lightheadedness with hypotension: yes Has patient had a PCN reaction causing severe rash involving mucus membranes or skin necrosis: No Has patient had a PCN reaction that required hospitalization No Has patient had a PCN reaction occurring within the last 10 years: No If all of the above answers are "NO",  then may proceed with Cephalosporin use.     Social History  Substance Use Topics  . Smoking status: Former Smoker    Types: Cigarettes  . Smokeless tobacco: Never Used  . Alcohol Use: No    Family History  Problem Relation Age of Onset  . Heart disease Maternal Uncle   . Diabetes Maternal Grandmother      Review of Systems Constitutional: negative for weight loss Gastrointestinal: negative for vomiting, positive for heart burn & constipation Genitourinary:negative for genital lesions and dysuria.  Positive for vaginal discharge. Musculoskeletal: positive for back pain Behavioral/Psych: negative for abusive relationship, depression, illegal drug usage and tobacco use    Objective:    BP 124/74 mmHg  Pulse 74 General Appearance: Ingeumentary:    Alert, cooperative, no distress, appears stated age   Skin tag, inner left thigh  Head:    Normocephalic, without obvious abnormality, atraumatic  Eyes:    PERRL, conjunctiva/corneas clear, EOM's intact, fundi    benign, both eyes  Ears:    Normal TM's and external ear canals, both ears  Nose:   Nares normal, septum midline, mucosa normal, no drainage    or sinus tenderness  Throat:   Lips, mucosa, and tongue normal; teeth and gums normal  Neck:   Supple, symmetrical, trachea midline, no adenopathy;    thyroid:  no enlargement/tenderness/nodules; no carotid   bruit or JVD  Back:     Symmetric, no curvature, ROM normal, no CVA  tenderness  Lungs:     Clear to auscultation bilaterally, respirations unlabored  Chest Wall:    No tenderness or deformity   Heart:    Regular rate and rhythm, S1 and S2 normal, no murmur, rub   or gallop  Breast Exam:    No tenderness, masses, or nipple abnormality  Abdomen:     Soft, non-tender, bowel sounds active all four quadrants,    no masses, no organomegaly  Genitalia:    Normal female without lesion, discharge or tenderness  Extremities:   Extremities normal, atraumatic, no cyanosis or edema   Pulses:   2+ and symmetric all extremities  Skin:   Skin color, texture, turgor normal, no rashes or lesions  Lymph nodes:   Cervical, supraclavicular, and axillary nodes normal  Neurologic:  Cervix:   CNII-XII intact, normal strength, sensation and reflexes    Throughout   1 cm dilated      Lab Review Urine pregnancy test Labs reviewed yes Radiologic studies reviewed yes Assessment:    Pregnancy at [redacted]w[redacted]d weeks    Plan:     Referral to social work (car seat, air conditioning / housing). Prenatal vitamins, gummy.  Counseling provided regarding continued use of seat belts, cessation of alcohol consumption, smoking or use of illicit drugs; infection precautions i.e., influenza/TDAP immunizations, toxoplasmosis,CMV, parvovirus, listeria and varicella; workplace safety, exercise during pregnancy; routine dental care, safe medications, sexual activity, hot tubs, saunas, pools, travel, caffeine use, fish and methlymercury, potential toxins, hair treatments, varicose veins Weight gain recommendations per IOM guidelines reviewed: underweight/BMI< 18.5--> gain 28 - 40 lbs; normal weight/BMI 18.5 - 24.9--> gain 25 - 35 lbs; overweight/BMI 25 - 29.9--> gain 15 - 25 lbs; obese/BMI >30->gain  11 - 20 lbs Problem list reviewed and updated. FIRST/CF mutation testing/NIPT/QUAD SCREEN/fragile X/Ashkenazi Jewish population testing/Spinal muscular atrophy discussed: results reviewed. Role of ultrasound in pregnancy discussed; fetal survey: results reviewed. Amniocentesis discussed: undecided.  No orders of the defined types were placed in this encounter.   Orders Placed This Encounter  Procedures  . POCT urinalysis dipstick    Follow up in 1 weeks. 50% of 30 min visit spent on counseling and coordination of care.

## 2015-08-25 DIAGNOSIS — O0990 Supervision of high risk pregnancy, unspecified, unspecified trimester: Secondary | ICD-10-CM | POA: Insufficient documentation

## 2015-08-25 NOTE — Progress Notes (Signed)
NST: + accels, no decels, moderate variability, Cat. 1 tracing. No contractions on toco.   Assessment:  Reactive NST.  [place]36wks  [place]     I agree with note by NP Student Peri MarisAndrew Brake.  Was present for exam.  R.Denney CNM

## 2015-08-28 ENCOUNTER — Other Ambulatory Visit: Payer: Self-pay | Admitting: Certified Nurse Midwife

## 2015-08-28 DIAGNOSIS — B373 Candidiasis of vulva and vagina: Secondary | ICD-10-CM

## 2015-08-28 DIAGNOSIS — B3731 Acute candidiasis of vulva and vagina: Secondary | ICD-10-CM

## 2015-08-28 MED ORDER — TERCONAZOLE 0.8 % VA CREA: 1.0000 | TOPICAL_CREAM | Freq: Every day | VAGINAL | Status: DC

## 2015-08-28 MED ORDER — FLUCONAZOLE 100 MG PO TABS: 100.0000 mg | ORAL_TABLET | Freq: Once | ORAL | Status: DC

## 2015-08-29 LAB — NUSWAB VG+, CANDIDA 6SP
CANDIDA GLABRATA, NAA: NEGATIVE
CANDIDA KRUSEI, NAA: NEGATIVE
CANDIDA LUSITANIAE, NAA: NEGATIVE
Candida albicans, NAA: POSITIVE — AB
Candida parapsilosis, NAA: NEGATIVE
Candida tropicalis, NAA: NEGATIVE
Chlamydia trachomatis, NAA: NEGATIVE
NEISSERIA GONORRHOEAE, NAA: NEGATIVE
Trich vag by NAA: NEGATIVE

## 2015-08-30 ENCOUNTER — Inpatient Hospital Stay (HOSPITAL_COMMUNITY)
Admission: AD | Admit: 2015-08-30 | Discharge: 2015-08-30 | Disposition: A | Discharge status: Home / Self Care | Payer: Medicaid Other | Source: Ambulatory Visit | Attending: Obstetrics | Admitting: Obstetrics

## 2015-08-30 ENCOUNTER — Encounter (HOSPITAL_COMMUNITY): Payer: Self-pay

## 2015-08-30 DIAGNOSIS — Z3A37 37 weeks gestation of pregnancy: Secondary | ICD-10-CM | POA: Insufficient documentation

## 2015-08-30 DIAGNOSIS — F319 Bipolar disorder, unspecified: Secondary | ICD-10-CM | POA: Insufficient documentation

## 2015-08-30 DIAGNOSIS — O4703 False labor before 37 completed weeks of gestation, third trimester: Secondary | ICD-10-CM | POA: Diagnosis not present

## 2015-08-30 DIAGNOSIS — Z87891 Personal history of nicotine dependence: Secondary | ICD-10-CM | POA: Insufficient documentation

## 2015-08-30 DIAGNOSIS — F4310 Post-traumatic stress disorder, unspecified: Secondary | ICD-10-CM | POA: Insufficient documentation

## 2015-08-30 DIAGNOSIS — E282 Polycystic ovarian syndrome: Secondary | ICD-10-CM | POA: Diagnosis not present

## 2015-08-30 DIAGNOSIS — R109 Unspecified abdominal pain: Secondary | ICD-10-CM | POA: Diagnosis present

## 2015-08-30 DIAGNOSIS — O99343 Other mental disorders complicating pregnancy, third trimester: Secondary | ICD-10-CM | POA: Diagnosis not present

## 2015-08-30 DIAGNOSIS — O479 False labor, unspecified: Secondary | ICD-10-CM | POA: Diagnosis not present

## 2015-08-30 MED ORDER — GI COCKTAIL ~~LOC~~
30.0000 mL | Freq: Once | ORAL | Status: AC
  Administered 2015-08-30: 30 mL via ORAL
  Filled 2015-08-30: qty 30

## 2015-08-30 NOTE — Discharge Instructions (Signed)
Fetal Movement Counts Patient Name: __________________________________________________ Patient Due Date: ____________________ Performing a fetal movement count is highly recommended in high-risk pregnancies, but it is good for every pregnant woman to do. Your health care provider may ask you to start counting fetal movements at 28 weeks of the pregnancy. Fetal movements often increase:  After eating a full meal.  After physical activity.  After eating or drinking something sweet or cold.  At rest. Pay attention to when you feel the baby is most active. This will help you notice a pattern of your baby's sleep and wake cycles and what factors contribute to an increase in fetal movement. It is important to perform a fetal movement count at the same time each day when your baby is normally most active.  HOW TO COUNT FETAL MOVEMENTS 1. Find a quiet and comfortable area to sit or lie down on your left side. Lying on your left side provides the best blood and oxygen circulation to your baby. 2. Write down the day and time on a sheet of paper or in a journal. 3. Start counting kicks, flutters, swishes, rolls, or jabs in a 2-hour period. You should feel at least 10 movements within 2 hours. 4. If you do not feel 10 movements in 2 hours, wait 2-3 hours and count again. Look for a change in the pattern or not enough counts in 2 hours. SEEK MEDICAL CARE IF:  You feel less than 10 counts in 2 hours, tried twice.  There is no movement in over an hour.  The pattern is changing or taking longer each day to reach 10 counts in 2 hours.  You feel the baby is not moving as he or she usually does. Date: ____________ Movements: ____________ Start time: ____________ Finish time: ____________  Date: ____________ Movements: ____________ Start time: ____________ Finish time: ____________ Date: ____________ Movements: ____________ Start time: ____________ Finish time: ____________ Date: ____________ Movements:  ____________ Start time: ____________ Finish time: ____________ Date: ____________ Movements: ____________ Start time: ____________ Finish time: ____________ Date: ____________ Movements: ____________ Start time: ____________ Finish time: ____________ Date: ____________ Movements: ____________ Start time: ____________ Finish time: ____________ Date: ____________ Movements: ____________ Start time: ____________ Finish time: ____________  Date: ____________ Movements: ____________ Start time: ____________ Finish time: ____________ Date: ____________ Movements: ____________ Start time: ____________ Finish time: ____________ Date: ____________ Movements: ____________ Start time: ____________ Finish time: ____________ Date: ____________ Movements: ____________ Start time: ____________ Finish time: ____________ Date: ____________ Movements: ____________ Start time: ____________ Finish time: ____________ Date: ____________ Movements: ____________ Start time: ____________ Finish time: ____________ Date: ____________ Movements: ____________ Start time: ____________ Finish time: ____________  Date: ____________ Movements: ____________ Start time: ____________ Finish time: ____________ Date: ____________ Movements: ____________ Start time: ____________ Finish time: ____________ Date: ____________ Movements: ____________ Start time: ____________ Finish time: ____________ Date: ____________ Movements: ____________ Start time: ____________ Finish time: ____________ Date: ____________ Movements: ____________ Start time: ____________ Finish time: ____________ Date: ____________ Movements: ____________ Start time: ____________ Finish time: ____________ Date: ____________ Movements: ____________ Start time: ____________ Finish time: ____________  Date: ____________ Movements: ____________ Start time: ____________ Finish time: ____________ Date: ____________ Movements: ____________ Start time: ____________ Finish  time: ____________ Date: ____________ Movements: ____________ Start time: ____________ Finish time: ____________ Date: ____________ Movements: ____________ Start time: ____________ Finish time: ____________ Date: ____________ Movements: ____________ Start time: ____________ Finish time: ____________ Date: ____________ Movements: ____________ Start time: ____________ Finish time: ____________ Date: ____________ Movements: ____________ Start time: ____________ Finish time: ____________  Date: ____________ Movements: ____________ Start time: ____________ Finish   time: ____________ Date: ____________ Movements: ____________ Start time: ____________ Finish time: ____________ Date: ____________ Movements: ____________ Start time: ____________ Finish time: ____________ Date: ____________ Movements: ____________ Start time: ____________ Finish time: ____________ Date: ____________ Movements: ____________ Start time: ____________ Finish time: ____________ Date: ____________ Movements: ____________ Start time: ____________ Finish time: ____________ Date: ____________ Movements: ____________ Start time: ____________ Finish time: ____________  Date: ____________ Movements: ____________ Start time: ____________ Finish time: ____________ Date: ____________ Movements: ____________ Start time: ____________ Finish time: ____________ Date: ____________ Movements: ____________ Start time: ____________ Finish time: ____________ Date: ____________ Movements: ____________ Start time: ____________ Finish time: ____________ Date: ____________ Movements: ____________ Start time: ____________ Finish time: ____________ Date: ____________ Movements: ____________ Start time: ____________ Finish time: ____________ Date: ____________ Movements: ____________ Start time: ____________ Finish time: ____________  Date: ____________ Movements: ____________ Start time: ____________ Finish time: ____________ Date: ____________  Movements: ____________ Start time: ____________ Finish time: ____________ Date: ____________ Movements: ____________ Start time: ____________ Finish time: ____________ Date: ____________ Movements: ____________ Start time: ____________ Finish time: ____________ Date: ____________ Movements: ____________ Start time: ____________ Finish time: ____________ Date: ____________ Movements: ____________ Start time: ____________ Finish time: ____________ Date: ____________ Movements: ____________ Start time: ____________ Finish time: ____________  Date: ____________ Movements: ____________ Start time: ____________ Finish time: ____________ Date: ____________ Movements: ____________ Start time: ____________ Finish time: ____________ Date: ____________ Movements: ____________ Start time: ____________ Finish time: ____________ Date: ____________ Movements: ____________ Start time: ____________ Finish time: ____________ Date: ____________ Movements: ____________ Start time: ____________ Finish time: ____________ Date: ____________ Movements: ____________ Start time: ____________ Finish time: ____________   This information is not intended to replace advice given to you by your health care provider. Make sure you discuss any questions you have with your health care provider.   Document Released: 03/27/2006 Document Revised: 03/18/2014 Document Reviewed: 12/23/2011 Elsevier Interactive Patient Education 2016 Else[employer] Braxton Hicks Contractions Contractions of the uterus can occur throughout pregnancy. Contractions are not always a sign that you are in labor.  WHAT ARE BRAXTON HICKS CONTRACTIONS?  Contractions that occur before labor are called Braxton Hicks contractions, or false labor. Toward the end of pregnancy (32-34 weeks), these contractions can develop more often and may become more forceful. This is not true labor because these contractions do not result in opening (dilatation) and thinning of  the cervix. They are sometimes difficult to tell apart from true labor because these contractions can be forceful and people have different pain tolerances. You should not feel embarrassed if you go to the hospital with false labor. Sometimes, the only way to tell if you are in true labor is for your health care provider to look for changes in the cervix. If there are no prenatal problems or other health problems associated with the pregnancy, it is completely safe to be sent home with false labor and await the onset of true labor. HOW CAN YOU TELL THE DIFFERENCE BETWEEN TRUE AND FALSE LABOR? False Labor  The contractions of false labor are usually shorter and not as hard as those of true labor.   The contractions are usually irregular.   The contractions are often felt in the front of the lower abdomen and in the groin.   The contractions may go away when you walk around or change positions while lying down.   The contractions get weaker and are shorter lasting as time goes on.   The contractions do not usually become progressively stronger, regular, and closer together as with true labor.  True Labor 5. Contractions in true   labor last 30-70 seconds, become very regular, usually become more intense, and increase in frequency.  6. The contractions do not go away with walking.  7. The discomfort is usually felt in the top of the uterus and spreads to the lower abdomen and low back.  8. True labor can be determined by your health care provider with an exam. This will show that the cervix is dilating and getting thinner.  WHAT TO REMEMBER  Keep up with your usual exercises and follow other instructions given by your health care provider.   Take medicines as directed by your health care provider.   Keep your regular prenatal appointments.   Eat and drink lightly if you think you are going into labor.   If Braxton Hicks contractions are making you uncomfortable:   Change  your position from lying down or resting to walking, or from walking to resting.   Sit and rest in a tub of warm water.   Drink 2-3 glasses of water. Dehydration may cause these contractions.   Do slow and deep breathing several times an hour.  WHEN SHOULD I SEEK IMMEDIATE MEDICAL CARE? Seek immediate medical care if:  Your contractions become stronger, more regular, and closer together.   You have fluid leaking or gushing from your vagina.   You have a fever.   You pass blood-tinged mucus.   You have vaginal bleeding.   You have continuous abdominal pain.   You have low back pain that you never had before.   You feel your baby's head pushing down and causing pelvic pressure.   Your baby is not moving as much as it used to.    This information is not intended to replace advice given to you by your health care provider. Make sure you discuss any questions you have with your health care provider.   Document Released: 02/25/2005 Document Revised: 03/02/2013 Document Reviewed: 12/07/2012 Elsevier Interactive Patient Education 2016 Elsevier Inc.  

## 2015-08-30 NOTE — MAU Provider Note (Signed)
History     CSN: [id number]  Arrival date and time: 08/30/15 [id number]   First Provider Initiated Contact with Patient 08/30/15 2017      Chief Complaint  Patient presents with  . Abdominal Pain   Abdominal Pain This is a new problem. The current episode started in the past 7 days. The onset quality is gradual. The problem occurs constantly. The problem has been unchanged. The pain is located in the epigastric region. The pain is at a severity of 9/10. The quality of the pain is sharp. The abdominal pain does not radiate. Pertinent negatives include no constipation, diarrhea, dysuria, fever, frequency, nausea or vomiting. Nothing aggravates the pain. The pain is relieved by nothing. She has tried nothing for the symptoms.    Past Medical History  Diagnosis Date  . Polycystic ovary disease   . Depression   . Bipolar 1 disorder (HCC)   . PTSD (post-traumatic stress disorder)   . Trichomonas infection   . Gonorrhea     Past Surgical History  Procedure Laterality Date  . Ear examination under anesthesia    . Wisdom tooth extraction      Family History  Problem Relation Age of Onset  . Heart disease Maternal Uncle   . Diabetes Maternal Grandmother     Social History  Substance Use Topics  . Smoking status: Former Smoker    Types: Cigarettes  . Smokeless tobacco: Never Used  . Alcohol Use: No    Allergies:  Allergies  Allergen Reactions  . Penicillins Hives and Other (See Comments)    Rocephin w/out reaction. Has patient had a PCN reaction causing immediate rash, facial/tongue/throat swelling, SOB or lightheadedness with hypotension: yes Has patient had a PCN reaction causing severe rash involving mucus membranes or skin necrosis: No Has patient had a PCN reaction that required hospitalization No Has patient had a PCN reaction occurring within the last 10 years: No If all of the above answers are "NO", then may proceed with Cephalosporin use.     Prescriptions prior  to admission  Medication Sig Dispense Refill Last Dose  . NIFEdipine (PROCARDIA) 10 MG capsule Take 10 mg by mouth every 8 (eight) hours as needed (for contractions).   08/28/2015  . Prenatal Vit-Fe Phos-FA-Omega (VITAFOL GUMMIES) 3.33-0.333-34.8 MG CHEW Chew 3 tablets by mouth daily. (Patient taking differently: Chew 1 tablet by mouth daily. ) 90 tablet 12 08/29/2015 at Unknown time  . docusate sodium (COLACE) 100 MG capsule Take 1 capsule (100 mg total) by mouth 2 (two) times daily. (Patient not taking: Reported on 08/30/2015) 90 capsule 1 Not Taking at Unknown time  . fluconazole (DIFLUCAN) 100 MG tablet Take 1 tablet (100 mg total) by mouth once. Repeat dose in 48-72 hour. (Patient not taking: Reported on 08/30/2015) 3 tablet 0 Not Taking at Unknown time  . omeprazole (PRILOSEC) 20 MG capsule Take 1 capsule (20 mg total) by mouth 2 (two) times daily before a meal. (Patient not taking: Reported on 08/30/2015) 60 capsule 4 Not Taking at Unknown time  . polyethylene glycol powder (GLYCOLAX/MIRALAX) powder Take 17 g by mouth daily. (Patient not taking: Reported on 08/30/2015) 500 g 0 Not Taking at Unknown time  . terconazole (TERAZOL 3) 0.8 % vaginal cream Place 1 applicator vaginally at bedtime. (Patient not taking: Reported on 08/30/2015) 20 g 0 Not Taking at Unknown time    Review of Systems  Constitutional: Negative for fever and chills.  Gastrointestinal: Positive for abdominal pain. Negative for nausea, vomiting,  diarrhea and constipation.  Genitourinary: Negative for dysuria, urgency and frequency.   Physical Exam   Blood pressure 127/55, pulse 67, temperature 98 F (36.7 C), temperature source Oral, resp. rate 18.  Physical Exam  Nursing note and vitals reviewed. Constitutional: She is oriented to person, place, and time. She appears well-developed and well-nourished. No distress.  HENT:  Head: Normocephalic.  Cardiovascular: Normal rate.   Respiratory: Effort normal.  GI: Soft. There is  no tenderness. There is no rebound.  Neurological: She is alert and oriented to person, place, and time.  Skin: Skin is warm and dry.  Psychiatric: She has a normal mood and affect.   Cervix: 3/thick, per RN  FHT 125, moderate with 15x15 accels, 2 variables when first placed on the monitor. None since.  Toco: no UCs   No cervical change over one hour.  MAU Course  Procedures  MDM   Assessment and Plan   1. False labor before 37 completed weeks of gestation in third trimester    DC home Comfort measures reviewed  3rd Trimester precautions  PTL precautions  Fetal kick counts RX: none  Return to MAU as needed FU with OB as planned  Follow-up Information    Follow up with Brock Bad, MD.   Specialty:  Obstetrics and Gynecology   Why:  As scheduled   Contact information:   [address] [place] [address] [phone]         Tawnya Crook 08/30/2015, 8:21 PM

## 2015-08-30 NOTE — MAU Note (Signed)
Patient is here with c/o ctx. Patient denies any bleeding or LOF.  

## 2015-08-31 ENCOUNTER — Ambulatory Visit (INDEPENDENT_AMBULATORY_CARE_PROVIDER_SITE_OTHER): Payer: Medicaid Other | Admitting: Certified Nurse Midwife

## 2015-08-31 ENCOUNTER — Encounter: Payer: Self-pay | Admitting: *Deleted

## 2015-08-31 VITALS — BP 125/72 | HR 60 | Wt 210.0 lb

## 2015-08-31 DIAGNOSIS — Z1389 Encounter for screening for other disorder: Secondary | ICD-10-CM | POA: Diagnosis not present

## 2015-08-31 DIAGNOSIS — Z3483 Encounter for supervision of other normal pregnancy, third trimester: Secondary | ICD-10-CM | POA: Diagnosis not present

## 2015-08-31 DIAGNOSIS — Z331 Pregnant state, incidental: Secondary | ICD-10-CM | POA: Diagnosis not present

## 2015-08-31 LAB — POCT URINALYSIS DIPSTICK
Bilirubin, UA: NEGATIVE
Blood, UA: NEGATIVE
Glucose, UA: NEGATIVE
Ketones, UA: NEGATIVE
Leukocytes, UA: NEGATIVE
Nitrite, UA: NEGATIVE
PROTEIN UA: NEGATIVE
Spec Grav, UA: 1.01
UROBILINOGEN UA: NEGATIVE
pH, UA: 7.5
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
## 2015-09-01 NOTE — Progress Notes (Signed)
I agree with note by NP Student Andrew Brake.  Was present for exam.  R.Dymphna Wadley CNM 

## 2015-09-07 ENCOUNTER — Ambulatory Visit (INDEPENDENT_AMBULATORY_CARE_PROVIDER_SITE_OTHER): Payer: Medicaid Other | Admitting: Certified Nurse Midwife

## 2015-09-07 VITALS — BP 134/72 | HR 78 | Wt 216.0 lb

## 2015-09-07 DIAGNOSIS — Z3493 Encounter for supervision of normal pregnancy, unspecified, third trimester: Secondary | ICD-10-CM | POA: Diagnosis not present

## 2015-09-07 DIAGNOSIS — Z1389 Encounter for screening for other disorder: Secondary | ICD-10-CM

## 2015-09-07 DIAGNOSIS — Z331 Pregnant state, incidental: Secondary | ICD-10-CM | POA: Diagnosis not present

## 2015-09-07 LAB — POCT URINALYSIS DIPSTICK
BILIRUBIN UA: NEGATIVE
Blood, UA: NEGATIVE
Glucose, UA: NEGATIVE
Ketones, UA: NEGATIVE
LEUKOCYTES UA: NEGATIVE
NITRITE UA: NEGATIVE
PH UA: 6
Spec Grav, UA: 1.025
Urobilinogen, UA: NEGATIVE
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
## 2015-09-07 NOTE — Progress Notes (Signed)
I agree with note by NP Student Andrew Brake.  Was present for exam.  R.Denney CNM 

## 2015-09-15 ENCOUNTER — Ambulatory Visit (INDEPENDENT_AMBULATORY_CARE_PROVIDER_SITE_OTHER): Payer: Medicaid Other | Admitting: Certified Nurse Midwife

## 2015-09-15 VITALS — BP 135/84 | HR 77 | Temp 98.4°F | Wt 220.5 lb

## 2015-09-15 DIAGNOSIS — Z3483 Encounter for supervision of other normal pregnancy, third trimester: Secondary | ICD-10-CM

## 2015-09-15 DIAGNOSIS — Z1389 Encounter for screening for other disorder: Secondary | ICD-10-CM

## 2015-09-15 DIAGNOSIS — Z331 Pregnant state, incidental: Secondary | ICD-10-CM

## 2015-09-15 DIAGNOSIS — O0993 Supervision of high risk pregnancy, unspecified, third trimester: Secondary | ICD-10-CM

## 2015-09-15 LAB — POCT URINALYSIS DIPSTICK
Bilirubin, UA: NEGATIVE
Glucose, UA: NEGATIVE
Ketones, UA: NEGATIVE
Leukocytes, UA: NEGATIVE
NITRITE UA: NEGATIVE
PH UA: 5
RBC UA: NEGATIVE
SPEC GRAV UA: 1.025
UROBILINOGEN UA: NEGATIVE

## 2015-09-15 NOTE — Assessment & Plan Note (Addendum)
  Clinic  [facility] Prenatal Labs  Dating  Blood type:   A+  Genetic Screen 1 Screen:    AFP:     Quad:     NIPS: Antibody:  Neg  Anatomic US  Rubella:    GTT Early:               Third trimester:  RPR:     Flu vaccine  HBsAg:     TDaP vaccine                                               Rhogam: N/A HIV:     Baby Food  Breast                                              GBS: (For PCN allergy, check sensitivities)  Contraception  Pap:  Circumcision  [facility]   [facility]  [facility]   Support Person
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
## 2015-09-16 ENCOUNTER — Encounter (HOSPITAL_COMMUNITY): Payer: Self-pay

## 2015-09-16 ENCOUNTER — Inpatient Hospital Stay (HOSPITAL_COMMUNITY)
Admission: AD | Admit: 2015-09-16 | Discharge: 2015-09-18 | DRG: 775 | Disposition: A | Discharge status: Home / Self Care | Payer: Medicaid Other | Source: Ambulatory Visit | Attending: Obstetrics and Gynecology | Admitting: Obstetrics and Gynecology

## 2015-09-16 DIAGNOSIS — IMO0001 Reserved for inherently not codable concepts without codable children: Secondary | ICD-10-CM

## 2015-09-16 DIAGNOSIS — Z8249 Family history of ischemic heart disease and other diseases of the circulatory system: Secondary | ICD-10-CM

## 2015-09-16 DIAGNOSIS — Z87891 Personal history of nicotine dependence: Secondary | ICD-10-CM

## 2015-09-16 DIAGNOSIS — F329 Major depressive disorder, single episode, unspecified: Secondary | ICD-10-CM | POA: Diagnosis present

## 2015-09-16 DIAGNOSIS — D6959 Other secondary thrombocytopenia: Secondary | ICD-10-CM | POA: Diagnosis not present

## 2015-09-16 DIAGNOSIS — O134 Gestational [pregnancy-induced] hypertension without significant proteinuria, complicating childbirth: Secondary | ICD-10-CM | POA: Diagnosis not present

## 2015-09-16 DIAGNOSIS — O99113 Other diseases of the blood and blood-forming organs and certain disorders involving the immune mechanism complicating pregnancy, third trimester: Secondary | ICD-10-CM

## 2015-09-16 DIAGNOSIS — O9912 Other diseases of the blood and blood-forming organs and certain disorders involving the immune mechanism complicating childbirth: Secondary | ICD-10-CM | POA: Diagnosis present

## 2015-09-16 DIAGNOSIS — O99344 Other mental disorders complicating childbirth: Secondary | ICD-10-CM | POA: Diagnosis present

## 2015-09-16 DIAGNOSIS — Z833 Family history of diabetes mellitus: Secondary | ICD-10-CM | POA: Diagnosis not present

## 2015-09-16 DIAGNOSIS — Z3A39 39 weeks gestation of pregnancy: Secondary | ICD-10-CM

## 2015-09-16 LAB — COMPREHENSIVE METABOLIC PANEL
ALK PHOS: 81 U/L (ref 38–126)
ALT: 17 U/L (ref 14–54)
ANION GAP: 11 (ref 5–15)
AST: 22 U/L (ref 15–41)
Albumin: 3.2 g/dL — ABNORMAL LOW (ref 3.5–5.0)
BILIRUBIN TOTAL: 0.5 mg/dL (ref 0.3–1.2)
BUN: 8 mg/dL (ref 6–20)
CALCIUM: 8.8 mg/dL — AB (ref 8.9–10.3)
CO2: 17 mmol/L — ABNORMAL LOW (ref 22–32)
Chloride: 107 mmol/L (ref 101–111)
Creatinine, Ser: 0.59 mg/dL (ref 0.44–1.00)
GFR calc non Af Amer: >60 mL/min (ref >60)
GLUCOSE: 91 mg/dL (ref 65–99)
Potassium: 4.1 mmol/L (ref 3.5–5.1)
Sodium: 135 mmol/L (ref 135–145)
TOTAL PROTEIN: 6.7 g/dL (ref 6.5–8.1)

## 2015-09-16 LAB — PROTEIN / CREATININE RATIO, URINE
CREATININE, URINE: 141 mg/dL
Protein Creatinine Ratio: 0.09 mg/mg{Cre} (ref 0.00–0.15)
Total Protein, Urine: 12 mg/dL

## 2015-09-16 LAB — CBC
HCT: 36.8 % (ref 36.0–46.0)
Hemoglobin: 12.8 g/dL (ref 12.0–15.0)
MCH: 30.1 pg (ref 26.0–34.0)
MCHC: 34.8 g/dL (ref 30.0–36.0)
MCV: 86.6 fL (ref 78.0–100.0)
PLATELETS: 103 10*3/uL — AB (ref 150–400)
RBC: 4.25 MIL/uL (ref 3.87–5.11)
RDW: 14.3 % (ref 11.5–15.5)
WBC: 12.4 10*3/uL — AB (ref 4.0–10.5)

## 2015-09-16 LAB — TYPE AND SCREEN
ABO/RH(D): A POS
Antibody Screen: NEGATIVE

## 2015-09-16 LAB — RUBELLA ANTIBODY, IGM: Rubella IgM: <20 AU/mL (ref 0.0–19.9)

## 2015-09-16 LAB — HEPATITIS B SURFACE ANTIGEN: Hepatitis B Surface Ag: NEGATIVE

## 2015-09-16 LAB — ABO/RH: ABO/RH(D): A POS

## 2015-09-16 LAB — HIV ANTIBODY (ROUTINE TESTING W REFLEX): HIV Screen 4th Generation wRfx: NONREACTIVE

## 2015-09-16 LAB — RPR: RPR Ser Ql: NONREACTIVE

## 2015-09-16 LAB — HEPATITIS C ANTIBODY: Hep C Virus Ab: <0.1 s/co ratio (ref 0.0–0.9)

## 2015-09-16 MED ORDER — BENZOCAINE-MENTHOL 20-0.5 % EX AERO
1.0000 "application " | INHALATION_SPRAY | CUTANEOUS | Status: DC | PRN
  Administered 2015-09-16: 1 via TOPICAL
  Filled 2015-09-16: qty 56

## 2015-09-16 MED ORDER — FENTANYL CITRATE (PF) 100 MCG/2ML IJ SOLN
50.0000 ug | Freq: Once | INTRAMUSCULAR | Status: AC
  Administered 2015-09-16: 50 ug via INTRAVENOUS
  Filled 2015-09-16: qty 2

## 2015-09-16 MED ORDER — OXYTOCIN BOLUS FROM INFUSION: 500.0000 mL | INTRAVENOUS | Status: DC

## 2015-09-16 MED ORDER — ONDANSETRON HCL 4 MG/2ML IJ SOLN: 4.0000 mg | Freq: Four times a day (QID) | INTRAMUSCULAR | Status: DC | PRN

## 2015-09-16 MED ORDER — DIPHENHYDRAMINE HCL 25 MG PO CAPS: 25.0000 mg | ORAL_CAPSULE | Freq: Four times a day (QID) | ORAL | Status: DC | PRN

## 2015-09-16 MED ORDER — SODIUM CHLORIDE 0.9% FLUSH: 3.0000 mL | INTRAVENOUS | Status: DC | PRN

## 2015-09-16 MED ORDER — SIMETHICONE 80 MG PO CHEW: 80.0000 mg | CHEWABLE_TABLET | ORAL | Status: DC | PRN

## 2015-09-16 MED ORDER — IBUPROFEN 600 MG PO TABS
600.0000 mg | ORAL_TABLET | Freq: Four times a day (QID) | ORAL | Status: DC
  Administered 2015-09-16 – 2015-09-18 (×7): 600 mg via ORAL
  Filled 2015-09-16 (×7): qty 1

## 2015-09-16 MED ORDER — DIBUCAINE 1 % RE OINT: 1.0000 "application " | TOPICAL_OINTMENT | RECTAL | Status: DC | PRN

## 2015-09-16 MED ORDER — FENTANYL CITRATE (PF) 100 MCG/2ML IJ SOLN
100.0000 ug | INTRAMUSCULAR | Status: DC | PRN
  Administered 2015-09-16: 100 ug via INTRAVENOUS
  Filled 2015-09-16: qty 2

## 2015-09-16 MED ORDER — ACETAMINOPHEN 325 MG PO TABS
650.0000 mg | ORAL_TABLET | ORAL | Status: DC | PRN
Start: 2015-09-16 — End: 2015-09-18
  Administered 2015-09-16 – 2015-09-17 (×3): 650 mg via ORAL
  Filled 2015-09-16 (×3): qty 2

## 2015-09-16 MED ORDER — ONDANSETRON HCL 4 MG/2ML IJ SOLN
4.0000 mg | INTRAMUSCULAR | Status: DC | PRN
Start: 2015-09-16 — End: 2015-09-18

## 2015-09-16 MED ORDER — LACTATED RINGERS IV SOLN
INTRAVENOUS | Status: DC
  Administered 2015-09-16: 10:00:00 via INTRAVENOUS

## 2015-09-16 MED ORDER — COCONUT OIL OIL: 1.0000 "application " | TOPICAL_OIL | Status: DC | PRN

## 2015-09-16 MED ORDER — PRENATAL MULTIVITAMIN CH
1.0000 | ORAL_TABLET | Freq: Every day | ORAL | Status: DC
  Administered 2015-09-17: 1 via ORAL
  Filled 2015-09-16: qty 1

## 2015-09-16 MED ORDER — MEASLES, MUMPS & RUBELLA VAC ~~LOC~~ INJ: 0.5000 mL | INJECTION | Freq: Once | SUBCUTANEOUS | Status: DC

## 2015-09-16 MED ORDER — FLEET ENEMA 7-19 GM/118ML RE ENEM: 1.0000 | ENEMA | RECTAL | Status: DC | PRN

## 2015-09-16 MED ORDER — SODIUM CHLORIDE 0.9 % IV SOLN: 250.0000 mL | INTRAVENOUS | Status: DC | PRN

## 2015-09-16 MED ORDER — ACETAMINOPHEN 325 MG PO TABS: 650.0000 mg | ORAL_TABLET | ORAL | Status: DC | PRN

## 2015-09-16 MED ORDER — LACTATED RINGERS IV SOLN: 500.0000 mL | INTRAVENOUS | Status: DC | PRN

## 2015-09-16 MED ORDER — SENNOSIDES-DOCUSATE SODIUM 8.6-50 MG PO TABS
2.0000 | ORAL_TABLET | ORAL | Status: DC
  Administered 2015-09-16 – 2015-09-17 (×2): 2 via ORAL
  Filled 2015-09-16 (×2): qty 2

## 2015-09-16 MED ORDER — ONDANSETRON HCL 4 MG PO TABS: 4.0000 mg | ORAL_TABLET | ORAL | Status: DC | PRN

## 2015-09-16 MED ORDER — ZOLPIDEM TARTRATE 5 MG PO TABS
5.0000 mg | ORAL_TABLET | Freq: Every evening | ORAL | Status: DC | PRN
  Administered 2015-09-17: 5 mg via ORAL
  Filled 2015-09-16: qty 1

## 2015-09-16 MED ORDER — OXYCODONE-ACETAMINOPHEN 5-325 MG PO TABS: 1.0000 | ORAL_TABLET | ORAL | Status: DC | PRN

## 2015-09-16 MED ORDER — TETANUS-DIPHTH-ACELL PERTUSSIS 5-2.5-18.5 LF-MCG/0.5 IM SUSP
0.5000 mL | Freq: Once | INTRAMUSCULAR | Status: AC
  Administered 2015-09-17: 0.5 mL via INTRAMUSCULAR
  Filled 2015-09-16: qty 0.5

## 2015-09-16 MED ORDER — LIDOCAINE HCL (PF) 1 % IJ SOLN
30.0000 mL | INTRAMUSCULAR | Status: DC | PRN
  Filled 2015-09-16: qty 30

## 2015-09-16 MED ORDER — SODIUM CHLORIDE 0.9% FLUSH: 3.0000 mL | Freq: Two times a day (BID) | INTRAVENOUS | Status: DC

## 2015-09-16 MED ORDER — WITCH HAZEL-GLYCERIN EX PADS: 1.0000 "application " | MEDICATED_PAD | CUTANEOUS | Status: DC | PRN

## 2015-09-16 MED ORDER — FLEET ENEMA 7-19 GM/118ML RE ENEM: 1.0000 | ENEMA | Freq: Every day | RECTAL | Status: DC | PRN

## 2015-09-16 MED ORDER — BISACODYL 10 MG RE SUPP
10.0000 mg | Freq: Every day | RECTAL | Status: DC | PRN
  Filled 2015-09-16: qty 1

## 2015-09-16 MED ORDER — OXYTOCIN 40 UNITS IN LACTATED RINGERS INFUSION - SIMPLE MED
2.5000 [IU]/h | INTRAVENOUS | Status: DC
  Filled 2015-09-16: qty 1000

## 2015-09-16 MED ORDER — SOD CITRATE-CITRIC ACID 500-334 MG/5ML PO SOLN: 30.0000 mL | ORAL | Status: DC | PRN

## 2015-09-16 MED ORDER — OXYCODONE-ACETAMINOPHEN 5-325 MG PO TABS: 2.0000 | ORAL_TABLET | ORAL | Status: DC | PRN

## 2015-09-16 NOTE — MAU Note (Signed)
Pt brought in by EMS for labor check. Pt reports UC starting at 0400 every 3-4 min and have increasing gotten stronger. Denies any leaking of fluid or bleeding.

## 2015-09-16 NOTE — H&P (Signed)
Obstetrics Admission History & Physical  09/16/2015 - 11:22 AM Primary OBGYN: [facility]  Chief Complaint: active labor  History of Present Illness  25 y.o. [id number] @ 2077w1d (Dating: EDC7/14), with the above CC. Pregnancy complicated by: gestational thrombocytopenia, h/o depression, h/o 30wk SVD in 2014.  Ms. Marveen ReeksJalesa D Goudeau states that she started having regular contractions. No LOF, decreased FM or s/s or pre-x. She is a late transfer of care to [facility] at approx 36wks  Review of Systems:  her 12 point review of systems is negative or as noted in the History of Present Illness.  PMHx:  Past Medical History  Diagnosis Date  . Polycystic ovary disease   . Depression   . Bipolar 1 disorder (HCC)   . PTSD (post-traumatic stress disorder)   . Trichomonas infection   . Gonorrhea    PSHx:  Past Surgical History  Procedure Laterality Date  . Ear examination under anesthesia    . Wisdom tooth extraction     Medications:  Prescriptions prior to admission  Medication Sig Dispense Refill Last Dose  . Prenatal Vit-Fe Phos-FA-Omega (VITAFOL GUMMIES) 3.33-0.333-34.8 MG CHEW Chew 3 tablets by mouth daily. (Patient taking differently: Chew 1 tablet by mouth daily. ) 90 tablet 12 Past Week at Unknown time  . terconazole (TERAZOL 3) 0.8 % vaginal cream Place 1 applicator vaginally at bedtime. (Patient not taking: Reported on 09/07/2015) 20 g 0 Completed Course at Unknown time     Allergies: is allergic to penicillins. OBHx:  OB History  Gravida Para Term Preterm AB SAB TAB Ectopic Multiple Living  2 1  1      1     # Outcome Date GA Lbr Len/2nd Weight Sex Delivery Anes PTL Lv  2 Current           1 Preterm 10/15/12 3575w0d 07:45 / 00:10 1.361 kg (3 lb) F Vag-Spont None  Y     GYNHx:  History of STIs: No., no h/o abnormal paps        FHx:  Family History  Problem Relation Age of Onset  . Heart disease Maternal Uncle   . Diabetes Maternal Grandmother    Soc Hx:  Social History   Social  History  . Marital Status: Single    Spouse Name: N/A  . Number of Children: N/A  . Years of Education: N/A   Occupational History  . Part-time Psychologist, sport and exercisestudent     [employer]   Social History Main Topics  . Smoking status: Former Smoker    Types: Cigarettes  . Smokeless tobacco: Never Used  . Alcohol Use: No  . Drug Use: No  . Sexual Activity: Yes    Birth Control/ Protection: IUD     Comment: Last encounter 2 weeks   Other Topics Concern  . Not on file   Social History Narrative    Objective   Filed Vitals:   09/16/15 1104 09/16/15 1119  BP: 145/68 135/66  Pulse: 83 86  Temp:    Resp:      Current Vital Signs 24h Vital Sign Ranges  T 97.7 F (36.5 C) Temp  Avg: 98.1 F (36.7 C)  Min: 97.7 F (36.5 C)  Max: 98.4 F (36.9 C)  BP 135/66 mmHg BP  Min: 133/61  Max: 151/93  HR 86 Pulse  Avg: 80.9  Min: 77  Max: 87  RR 18 Resp  Avg: 18  Min: 18  Max: 18  SaO2     No Data Recorded  24 Hour I/O Current Shift I/O  Time Ins Outs       EFM: 125 baseline, +accels, no decel, mod var  Toco: q2-33m  General: Well nourished, well developed female in no acute distress.  Skin:  Warm and dry.  Cardiovascular: S1, S2 normal, no murmur, rub or gallop, regular rate and rhythm Respiratory:  Clear to auscultation bilateral. Normal respiratory effort Abdomen: gravid, nttp Neuro/Psych:  Normal mood and affect.   SSE: deferred SVE: 4-5cm per RN  Leopolds/EFW: cephalic, 3400gm  Labs  pending HELLP and admit labs Radiology none  Perinatal info    ABO, Rh: --/--/A POS (07/08 1011) Antibody: NEG (07/08 1011) Rubella: <20.0 (07/07 1608) RPR: Non Reactive (07/07 1608)  HBsAg: Negative (07/07 1608)  HIV: Non Reactive (07/07 1608)  ZOX:WRUEAVWU (06/06 0000)   Assessment & Plan   24 y.o. [id number] @ [redacted]w[redacted]d with active labor, gHTN; pt currently stable *IUP: category I with accels, fetal status reassuring *Labor: admit to L&D. Check SVE in a few hours and augment PRN *HTN: follow  up pre-x labs; pt only with mild range BPs. No s/s of pre-x *Heme: f/u HELLP lab *GBS: neg *Analgesia: IV prns given. Options d/w pt  Cornelia Copa MD Attending Center for [facility])

## 2015-09-16 NOTE — Progress Notes (Signed)
Questionable decel down to 50 bpm. Intermittent tracing, pt changing position.

## 2015-09-17 DIAGNOSIS — Z3A39 39 weeks gestation of pregnancy: Secondary | ICD-10-CM

## 2015-09-17 DIAGNOSIS — F329 Major depressive disorder, single episode, unspecified: Secondary | ICD-10-CM

## 2015-09-17 DIAGNOSIS — O134 Gestational [pregnancy-induced] hypertension without significant proteinuria, complicating childbirth: Secondary | ICD-10-CM

## 2015-09-17 DIAGNOSIS — O99344 Other mental disorders complicating childbirth: Secondary | ICD-10-CM

## 2015-09-17 DIAGNOSIS — O9912 Other diseases of the blood and blood-forming organs and certain disorders involving the immune mechanism complicating childbirth: Secondary | ICD-10-CM

## 2015-09-17 DIAGNOSIS — Z87891 Personal history of nicotine dependence: Secondary | ICD-10-CM

## 2015-09-17 DIAGNOSIS — D6959 Other secondary thrombocytopenia: Secondary | ICD-10-CM

## 2015-09-17 LAB — RPR: RPR Ser Ql: NONREACTIVE

## 2015-09-17 MED ORDER — OXYCODONE-ACETAMINOPHEN 5-325 MG PO TABS
1.0000 | ORAL_TABLET | ORAL | Status: DC | PRN
  Administered 2015-09-17 – 2015-09-18 (×4): 1 via ORAL
  Filled 2015-09-17 (×4): qty 1

## 2015-09-17 NOTE — Progress Notes (Signed)
CSW met with pt to discuss mental health issues.  Per pt, she had problems "in the past" but currently takes no medication and does not receive therapy.  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  Pt is confident that she will be able to recognize s/s of PPD and is agreeable to f/u with her MD for any changes in mood/behavior, post-partum. No other social work needs identified.  Emotional support provided.  CSW signing off.  Please re-consult as necessary.  Creta Levin, LCSW Weekend Coverage [phone]

## 2015-09-17 NOTE — Lactation Note (Signed)
This note was copied from a baby's chart. Lactation Consultation Note  Patient Name: Boy Marigene EhlersJalesa Dehaas ZOXWR'UToday's Date: 09/17/2015 Reason for consult: Initial assessment;Difficult latch  Initial visit with Mom, baby 3622 hrs old.  Mom was started (by RN) with a 20 mm nipple shield during the night to use on left breast.  Left nipple inverted.  Has a hand pump at bedside to pre-pump to pull nipple out prior to the nipple shield.  Mom aware of how to apply the NS correctly to create a vacuum to pull her nipple into shield.  Mom states baby had just fed for 30 minutes, and she had colostrum in the shield at end of feeding.  Baby cueing in the crib, and offered to assist with latching baby onto right breast., without nipple shield.  Baby positioned in football hold, and latched deeply.  Initially baby needed some stimulation to keep sucking, but after a couple rounds of sucks/swallows, he was independent.  Mom complaining of strong uterine cramping, needing pain medication.  Lots of teaching done while baby feeding.  Encouraged Mom to keep baby skin to skin as much as she can, and feed him often on cue.   Brochure left with Mom.  Informed her of IP and OP Lactation services available to her.  Encouraged her to call as needed, and LC to follow up in am.  Maternal Data Formula Feeding for Exclusion: No Has patient been taught Hand Expression?: Yes Does the patient have breastfeeding experience prior to this delivery?: Yes  Feeding Feeding Type: Breast Fed Length of feed: 30 min  LATCH Score/Interventions Latch: Grasps breast easily, tongue down, lips flanged, rhythmical sucking. Intervention(s): Breast compression;Breast massage;Assist with latch;Adjust position  Audible Swallowing: Spontaneous and intermittent Intervention(s): Alternate breast massage;Hand expression;Skin to skin  Type of Nipple: Everted at rest and after stimulation (short nipple shaft) Intervention(s): Hand pump  Comfort  (Breast/Nipple): Soft / non-tender     Hold (Positioning): Assistance needed to correctly position infant at breast and maintain latch. Intervention(s): Breastfeeding basics reviewed;Support Pillows;Position options;Skin to skin  LATCH Score: 9  Lactation Tools Discussed/Used Initiated by:: RN Date initiated:: 09/16/15   Consult Status Consult Status: Follow-up Date: 09/18/15 Follow-up type: In-patient    Judee ClaraSmith, Tiauna Whisnant E 09/17/2015, 2:34 PM

## 2015-09-17 NOTE — Progress Notes (Signed)
Post Partum Day 1 Subjective: no complaints, up ad lib, voiding, tolerating PO and c/o severe afterbirth cramping unrelieved by tylenol  Objective: Blood pressure 143/68, pulse 62, temperature 98 F (36.7 C), temperature source Oral, resp. rate 20, height 6' (1.829 m), weight 220 lb 8 oz (100.018 kg), SpO2 98 %, unknown if currently breastfeeding.  Physical Exam:  General: alert, cooperative, appears stated age and no distress Lochia: appropriate Uterine Fundus: firm Incision: n/a DVT Evaluation: No evidence of DVT seen on physical exam. Negative Homan's sign. No cords or calf tenderness.   Recent Labs  09/16/15 1011  HGB 12.8  HCT 36.8    Assessment/Plan: Plan for discharge tomorrow   LOS: 1 day   Candace Richardson 09/17/2015, 10:20 AM

## 2015-09-18 MED ORDER — IBUPROFEN 600 MG PO TABS: 600.0000 mg | ORAL_TABLET | Freq: Four times a day (QID) | ORAL | Status: DC

## 2015-09-18 NOTE — Progress Notes (Signed)
CSW met with MOB to assess need for transportation.  CSW provided MOB and MOB's support one[place]way [place] bus passes for discharge.

## 2015-09-18 NOTE — Lactation Note (Signed)
This note was copied from a baby's chart. Lactation Consultation Note; Mom has baby latched to breast as I went into room. Reports he has been feeding for 15 min. Getting sleepy. Attempted to latch to other breast. Mom reports she is using NS on that breast because nipple is inverted. Mom states she is having trouble getting the NS on. Reviewed placement with mom and she demonstrated correct placement of NS. Baby too sleepy and did not latch. Mom reports breasts are feeling fuller this morning. Reviewed engorgement prevention and treatment. Reviewed our phone number, OP appointments and BFSG as resources after DC. To call prn  Patient Name: Candace Richardson ZOXWR'UToday's Date: 09/18/2015 Reason for consult: Follow-up assessment   Maternal Data Formula Feeding for Exclusion: No Has patient been taught Hand Expression?: Yes Does the patient have breastfeeding experience prior to this delivery?: Yes  Feeding Feeding Type: Breast Fed Length of feed: 15 min  LATCH Score/Interventions Latch: Grasps breast easily, tongue down, lips flanged, rhythmical sucking.  Audible Swallowing: Spontaneous and intermittent  Type of Nipple: Everted at rest and after stimulation  Comfort (Breast/Nipple): Soft / non-tender     Hold (Positioning): No assistance needed to correctly position infant at breast.  LATCH Score: 10  Lactation Tools Discussed/Used [facility] Yes   Consult Status Consult Status: Complete    Pamelia HoitWeeks, [name] 09/18/2015, 8:33 AM

## 2015-09-18 NOTE — Discharge Summary (Signed)
OB Discharge Summary  Patient Name: Candace ReeksJalesa D Underberg DOB: [birth year] MRN: [id number]  Date of admission: 09/16/2015 Delivering MD: Shawna ClampBOOKER, KIMBERLY R   Date of discharge: 09/18/2015  Admitting diagnosis: 39 weeks contractions Intrauterine pregnancy: 563w1d     Secondary diagnosis:Active Problems:   Active labor  Additional problems:none     Discharge diagnosis: Term Pregnancy Delivered and thrombocytopenia                                                                     Post partum procedures:n/a  Augmentation: n/a  Complications: None  Hospital course:  Onset of Labor With Vaginal Delivery     25 y.o. yo [id number] at 703w1d was admitted in Active Labor on 09/16/2015. Patient had an uncomplicated labor course as follows:  Membrane Rupture Time/Date: 3:02 PM ,09/16/2015   Intrapartum Procedures: Episiotomy: None [1]                                         Lacerations:  None [1]  Patient had a delivery of a Viable infant. 09/16/2015  Information for the patient's newborn:  Karie Kirksunley, Boy Heleena [[id number]][[id number]]  Delivery Method: Vaginal, Spontaneous Delivery (Filed from Delivery Summary)    Pateint had an uncomplicated postpartum course.  She is ambulating, tolerating a regular diet, passing flatus, and urinating well. Patient is discharged home in stable condition on 09/18/2015.    Physical exam  Filed Vitals:   09/16/15 1952 09/17/15 0500 09/17/15 1719 09/18/15 0605  BP: 149/59 143/68 139/68 123/65  Pulse: 90 62 70 69  Temp: 98.6 F (37 C) 98 F (36.7 C) 97.8 F (36.6 C)   TempSrc:   Oral   Resp: 18 20 16 18   Height:      Weight:      SpO2:   96%    General: alert, cooperative and no distress Lochia: appropriate Uterine Fundus: firm Incision: N/A DVT Evaluation: No evidence of DVT seen on physical exam. Negative Homan's sign. No cords or calf tenderness.  Labs: Lab Results  Component Value Date   WBC 12.4* 09/16/2015   HGB 12.8 09/16/2015   HCT 36.8  09/16/2015   MCV 86.6 09/16/2015   PLT 103* 09/16/2015   CMP Latest Ref Rng 09/16/2015  Glucose 65 - 99 mg/dL 91  BUN 6 - 20 mg/dL 8  Creatinine 4.010.44 - 0.271.00 mg/dL 2.530.59  Sodium 664135 - 403145 mmol/L 135  Potassium 3.5 - 5.1 mmol/L 4.1  Chloride 101 - 111 mmol/L 107  CO2 22 - 32 mmol/L 17(L)  Calcium 8.9 - 10.3 mg/dL 4.7(Q8.8(L)  Total Protein 6.5 - 8.1 g/dL 6.7  Total Bilirubin 0.3 - 1.2 mg/dL 0.5  Alkaline Phos 38 - 126 U/L 81  AST 15 - 41 U/L 22  ALT 14 - 54 U/L 17    Discharge instruction: per After Visit Summary and "Baby and Me Booklet".  After Visit Meds:    Medication List    ASK your doctor about these medications        terconazole 0.8 % vaginal cream  Commonly known as:  TERAZOL 3  Place 1 applicator vaginally  at bedtime.     VITAFOL GUMMIES 3.33-0.333-34.8 MG Chew  Chew 3 tablets by mouth daily.        Diet: routine diet  Activity: Advance as tolerated. Pelvic rest for 6 weeks.   Outpatient follow up:6 weeks Follow up Appt:Future Appointments Date Time Provider Department Center  09/22/2015 2:00 PM Roe Coombs, CNM [facility]  09/27/2015 7:30 AM [facility] ROOM [facility] None   Follow up visit: No Follow-up on file.  Postpartum contraception: Undecided  Newborn Data: Live born female  Birth Weight: 6 lb 11.4 oz (3045 g) APGAR: 8, 9  Baby Feeding: Breast Disposition:home with mother   09/18/2015 Wyvonnia Dusky, CNM

## 2015-09-20 ENCOUNTER — Telehealth (HOSPITAL_COMMUNITY): Payer: Self-pay | Admitting: *Deleted

## 2015-09-20 NOTE — Telephone Encounter (Signed)
Preadmission screen  

## 2015-09-22 ENCOUNTER — Encounter: Payer: Medicaid Other | Admitting: Certified Nurse Midwife

## 2015-09-27 ENCOUNTER — Inpatient Hospital Stay (HOSPITAL_COMMUNITY): Admission: RE | Admit: 2015-09-27 | Payer: Medicaid Other | Source: Ambulatory Visit

## 2015-10-03 ENCOUNTER — Encounter: Payer: Self-pay | Admitting: Certified Nurse Midwife

## 2015-10-03 ENCOUNTER — Ambulatory Visit (INDEPENDENT_AMBULATORY_CARE_PROVIDER_SITE_OTHER): Payer: Medicaid Other | Admitting: Certified Nurse Midwife

## 2015-10-03 NOTE — Progress Notes (Signed)
Subjective:     Candace Richardson is a 25 y.o. female who presents for a postpartum visit. She is 2 weeks postpartum following a spontaneous vaginal delivery. I have fully reviewed the prenatal and intrapartum course. The delivery was at 39 gestational weeks 1 day. Outcome: spontaneous vaginal delivery. Anesthesia: IV pain medications. Postpartum course has been normal. Baby's course has been normal. Baby is feeding by breast. Bleeding staining only. Bowel function is normal. Bladder function is normal. Patient is not sexually active. Contraception method is abstinence. Postpartum depression screening: negative.  Tobacco, alcohol and substance abuse history reviewed.  Adult immunizations reviewed including TDAP, rubella and varicella.  The following portions of the patient's history were reviewed and updated as appropriate: allergies, current medications, past family history, past medical history, past social history, past surgical history and problem list.  Review of Systems Pertinent items are noted in HPI.   Objective:    There were no vitals taken for this visit.  General:  alert, cooperative and appears stated age   Breasts:  inspection negative, no nipple discharge or bleeding, no masses or nodularity palpable  Lungs: clear to auscultation bilaterally  Heart:  regular rate and rhythm, S1, S2 normal, no murmur, click, rub or gallop  Abdomen: soft, non-tender; bowel sounds normal; no masses,  no organomegaly   Vulva:  not evaluated  Vagina: not evaluated  Cervix:  not evaluated  Corpus: not examined  Adnexa:  not evaluated  Rectal Exam: Not performed.          50% of 20 min visit spent on counseling and coordination of care.  Assessment:     Normal postpartum exam. Pap smear not done at today's visit.   Contraception counseling. Plan:    1. Contraception: Nexplanon 2. Pap & Nexplanon @ next visit. 3. Follow up in: 4 weeks or as needed.  2hr GTT for h/o GDM/screening for DM q 3  yrs per ADA recommendations Preconception counseling provided Healthy lifestyle practices reviewed

## 2015-10-31 ENCOUNTER — Ambulatory Visit (INDEPENDENT_AMBULATORY_CARE_PROVIDER_SITE_OTHER): Payer: Medicaid Other | Admitting: Certified Nurse Midwife

## 2015-10-31 ENCOUNTER — Encounter: Payer: Self-pay | Admitting: Certified Nurse Midwife

## 2015-10-31 VITALS — BP 122/66 | HR 66 | Wt 195.0 lb

## 2015-10-31 DIAGNOSIS — Z3049 Encounter for surveillance of other contraceptives: Secondary | ICD-10-CM

## 2015-10-31 DIAGNOSIS — Z30017 Encounter for initial prescription of implantable subdermal contraceptive: Secondary | ICD-10-CM | POA: Insufficient documentation

## 2015-10-31 NOTE — Progress Notes (Signed)
Nexplanon Procedure Note   PRE-OP DIAGNOSIS: desired long-term, reversible contraception  POST-OP DIAGNOSIS: Same  PROCEDURE: Nexplanon  placement Performing Provider: Orvilla Cornwallachelle Aliou Mealey CNM   Patient education prior to procedure, explained risk, benefits of Nexplanon, reviewed alternative options. Patient reported understanding. Gave consent to continue with procedure.   PROCEDURE:  Pregnancy Text :  Negative Site (check):      left arm         Sterile Preparation:   Betadinex3 Lot # [id number] Expiration Date  01/2018  Insertion site was selected 8 - 10 cm from medial epicondyle and marked along with guiding site using sterile marker. Procedure area was prepped and draped in a sterile fashion. 1% Lidocaine 1.5 ml given prior to procedure. Nexplanon  was inserted subcutaneously.Needle was removed from the insertion site. Nexplanon capsule was palpated by provider and patient to assure satisfactory placement. And a bandage applied and the arm was wrapped with gauze bandage.     Followup: The patient tolerated the procedure well without complications.  Instructions:  The patient was instructed to remove the dressing in 24 hours and that some bruising is to be expected.  She was advised to use over the counter analgesics as needed for any pain at the site.  She is to keep the area dry for 24 hours and to call if her hand or arm becomes cold, numb, or blue.   Orvilla Cornwallachelle Lavanda Nevels CNM

## 2015-11-17 ENCOUNTER — Encounter: Payer: Self-pay | Admitting: Certified Nurse Midwife

## 2016-04-02 ENCOUNTER — Encounter (HOSPITAL_COMMUNITY): Payer: Self-pay | Admitting: Emergency Medicine

## 2016-04-02 ENCOUNTER — Emergency Department (HOSPITAL_COMMUNITY)
Admission: EM | Admit: 2016-04-02 | Discharge: 2016-04-02 | Disposition: A | Discharge status: Home / Self Care | Payer: Medicaid Other | Attending: Emergency Medicine | Admitting: Emergency Medicine

## 2016-04-02 DIAGNOSIS — M542 Cervicalgia: Secondary | ICD-10-CM | POA: Diagnosis present

## 2016-04-02 DIAGNOSIS — F17210 Nicotine dependence, cigarettes, uncomplicated: Secondary | ICD-10-CM | POA: Diagnosis not present

## 2016-04-02 NOTE — Discharge Instructions (Signed)
Please read attached information. If you experience any new or worsening signs or symptoms please return to the emergency room for evaluation. Please follow-up with your primary care provider or specialist as discussed.  °

## 2016-04-02 NOTE — ED Triage Notes (Signed)
Pt reports lump posterior neck. Upper back pain and burning  sensation epigastric area. No redness nor pain upon palpation on lump. Pt has multiple complaints.

## 2016-04-02 NOTE — Progress Notes (Signed)
Entered in d/c  Candace Richardson PCP - General  [phone] [address] [place] [phone]    Next Steps: Follow up    Instructions: Medicaid response hx indicates [facility] medicaid [facility] [address] [place] [id number] [phone] Drs Efraim KaufmannMelissa O'neal & Mikeal HawthorneJames HarbinAs a Medicaid client you MUST contact DSS/SSI each time you change address
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
## 2017-04-16 ENCOUNTER — Encounter: Payer: Self-pay | Admitting: Certified Nurse Midwife

## 2017-04-16 ENCOUNTER — Ambulatory Visit: Payer: Self-pay | Admitting: Certified Nurse Midwife

## 2017-04-16 ENCOUNTER — Other Ambulatory Visit (HOSPITAL_COMMUNITY)
Admission: RE | Admit: 2017-04-16 | Discharge: 2017-04-16 | Disposition: A | Discharge status: Home / Self Care | Payer: Medicaid Other | Source: Ambulatory Visit | Attending: Certified Nurse Midwife | Admitting: Certified Nurse Midwife

## 2017-04-16 VITALS — BP 139/92 | HR 77 | Wt 235.1 lb

## 2017-04-16 DIAGNOSIS — N921 Excessive and frequent menstruation with irregular cycle: Secondary | ICD-10-CM

## 2017-04-16 DIAGNOSIS — N760 Acute vaginitis: Secondary | ICD-10-CM | POA: Insufficient documentation

## 2017-04-16 DIAGNOSIS — B9689 Other specified bacterial agents as the cause of diseases classified elsewhere: Secondary | ICD-10-CM | POA: Diagnosis not present

## 2017-04-16 DIAGNOSIS — Z978 Presence of other specified devices: Secondary | ICD-10-CM | POA: Diagnosis not present

## 2017-04-16 DIAGNOSIS — Z113 Encounter for screening for infections with a predominantly sexual mode of transmission: Secondary | ICD-10-CM | POA: Diagnosis not present

## 2017-04-16 DIAGNOSIS — Z975 Presence of (intrauterine) contraceptive device: Secondary | ICD-10-CM

## 2017-04-16 NOTE — Progress Notes (Signed)
Patient ID: Candace ReeksJalesa D Richardson, female   DOB: [birth year], 27 y.o.   MRN: [id number]  Chief Complaint  Patient presents with  . Vaginal Discharge    pink this am, last IC was x 1 month ago per pt, no cycle since Nexplanon x 9012yr  . vaginal irritation    denies foul odor and itching    HPI Candace Richardson is a 27 y.o. female.  Here for discharge that started about a week ago, noticed cramping and pink discharge this AM.  Had a new sexual partner about a month ago.  Denies any dysuria.  Denies any changes in activity, diet or medications.  Desires to stop smoking: [place] quit line number given.  Is smoking 5 cigs/day.   Has Nexplanon for birth control, no bleeding until today with it.  Nexplanon placed 10/31/15.    HPI  Past Medical History:  Diagnosis Date  . Bipolar 1 disorder (HCC)   . Depression   . Gonorrhea   . Polycystic ovary disease   . PTSD (post-traumatic stress disorder)   . Trichomonas infection     Past Surgical History:  Procedure Laterality Date  . EAR EXAMINATION UNDER ANESTHESIA    . WISDOM TOOTH EXTRACTION      Family History  Problem Relation Age of Onset  . Heart disease Maternal Uncle   . Diabetes Maternal Grandmother     Social History Social History   Tobacco Use  . Smoking status: Light Tobacco Smoker    Types: Cigarettes  . Smokeless tobacco: Never Used  Substance Use Topics  . Alcohol use: No    Alcohol/week: 0.0 oz  . Drug use: No    Allergies  Allergen Reactions  . Penicillins Hives and Other (See Comments)    Rocephin w/out reaction. Has patient had a PCN reaction causing immediate rash, facial/tongue/throat swelling, SOB or lightheadedness with hypotension: yes Has patient had a PCN reaction causing severe rash involving mucus membranes or skin necrosis: No Has patient had a PCN reaction that required hospitalization No Has patient had a PCN reaction occurring within the last 10 years: No If all of the above answers are "NO", then may  proceed with Cephalosporin use.     No current outpatient medications on file.   No current facility-administered medications for this visit.     Review of Systems Review of Systems Constitutional: negative for fatigue and weight loss Respiratory: negative for cough and wheezing Cardiovascular: negative for chest pain, fatigue and palpitations Gastrointestinal: negative for abdominal pain and change in bowel habits Genitourinary:+ discharge Integument/breast: negative for nipple discharge Musculoskeletal:negative for myalgias Neurological: negative for gait problems and tremors Behavioral/Psych: negative for abusive relationship, depression Endocrine: negative for temperature intolerance      Blood pressure (!) 139/92, pulse 77, weight 235 lb 1.6 oz (106.6 kg), currently breastfeeding.  Physical Exam Physical Exam General:   alert  Skin:   no rash or abnormalities  Lungs:   clear to auscultation bilaterally  Heart:   regular rate and rhythm, S1, S2 normal, no murmur, click, rub or gallop  Breasts:   deferred  Abdomen:  normal findings: no organomegaly, soft, non-tender and no hernia  Pelvis:  External genitalia: normal general appearance Urinary system: urethral meatus normal and bladder without fullness, nontender Vaginal: normal without tenderness, induration or masses Cervix: no CMT Adnexa: normal bimanual exam Uterus: anteverted and non-tender, normal size    50% of 15 min visit spent on counseling and coordination of care.  Data Reviewed Previous medical hx, meds  Assessment     H/O STI Vaginal discharge STD screening exam Tobacco abuse cessation counseling    Plan   [place] Quit line   Swab Orders Placed This Encounter  Procedures  . RPR  . Hepatitis C antibody  . Hepatitis B surface antigen  . HIV antibody     Follow up as needed.

## 2017-04-17 LAB — HEPATITIS B SURFACE ANTIGEN: HEP B S AG: NEGATIVE

## 2017-04-17 LAB — CERVICOVAGINAL ANCILLARY ONLY
BACTERIAL VAGINITIS: POSITIVE — AB
Candida vaginitis: NEGATIVE
Chlamydia: NEGATIVE
Neisseria Gonorrhea: NEGATIVE
TRICH (WINDOWPATH): NEGATIVE

## 2017-04-17 LAB — HIV ANTIBODY (ROUTINE TESTING W REFLEX): HIV SCREEN 4TH GENERATION: NONREACTIVE

## 2017-04-17 LAB — RPR: RPR Ser Ql: NONREACTIVE

## 2017-04-17 LAB — HEPATITIS C ANTIBODY: Hep C Virus Ab: <0.1 s/co ratio (ref 0.0–0.9)

## 2017-04-21 ENCOUNTER — Telehealth: Payer: Self-pay | Admitting: Certified Nurse Midwife

## 2017-04-21 NOTE — Telephone Encounter (Signed)
Patient called to get her lab results from her recent visit to the office.  Notified patient of +BV and yeast.  Routing to provider for Rx order. Instructed patient to pick up prescriptions this afternoon.

## 2017-04-23 ENCOUNTER — Other Ambulatory Visit: Payer: Self-pay | Admitting: Certified Nurse Midwife

## 2017-04-23 DIAGNOSIS — N760 Acute vaginitis: Principal | ICD-10-CM

## 2017-04-23 DIAGNOSIS — B373 Candidiasis of vulva and vagina: Secondary | ICD-10-CM

## 2017-04-23 DIAGNOSIS — B3731 Acute candidiasis of vulva and vagina: Secondary | ICD-10-CM

## 2017-04-23 DIAGNOSIS — B9689 Other specified bacterial agents as the cause of diseases classified elsewhere: Secondary | ICD-10-CM

## 2017-04-23 MED ORDER — TERCONAZOLE 0.8 % VA CREA: 1.0000 | TOPICAL_CREAM | Freq: Every day | VAGINAL | 0 refills | Status: DC

## 2017-04-23 MED ORDER — SECNIDAZOLE 2 G PO PACK
1.0000 | PACK | Freq: Once | ORAL | 0 refills | Status: AC
Start: 2017-04-23 — End: 2017-04-23

## 2017-04-23 MED ORDER — FLUCONAZOLE 200 MG PO TABS: 200.0000 mg | ORAL_TABLET | Freq: Once | ORAL | 0 refills | Status: AC

## 2017-04-25 ENCOUNTER — Telehealth: Payer: Self-pay

## 2017-04-25 NOTE — Telephone Encounter (Signed)
S/w pt and advised of results and rx sent. 

## 2017-04-25 NOTE — Telephone Encounter (Signed)
Attempted to contact about results and rx sent, no answer, vm not available...Marland Kitchen.PA done for solosec.

## 2017-04-28 ENCOUNTER — Other Ambulatory Visit: Payer: Self-pay

## 2017-04-28 NOTE — Telephone Encounter (Signed)
Pt called stating that she would like a pill sent in for bv. She states that was able to keep the solosec down.

## 2017-04-30 ENCOUNTER — Other Ambulatory Visit: Payer: Self-pay | Admitting: Certified Nurse Midwife

## 2017-04-30 DIAGNOSIS — B9689 Other specified bacterial agents as the cause of diseases classified elsewhere: Secondary | ICD-10-CM

## 2017-04-30 DIAGNOSIS — N760 Acute vaginitis: Principal | ICD-10-CM

## 2017-04-30 MED ORDER — TINIDAZOLE 500 MG PO TABS: 2.0000 g | ORAL_TABLET | Freq: Every day | ORAL | 0 refills | Status: DC

## 2017-04-30 NOTE — Telephone Encounter (Signed)
Please let her know that I have sent in Tindamax for her.   Thank you Boykin Reaperachelle

## 2017-05-01 ENCOUNTER — Telehealth: Payer: Self-pay | Admitting: *Deleted

## 2017-05-01 NOTE — Telephone Encounter (Signed)
PA for Tinidazole submitted and approved. LM on pharmacy line making aware of approval.

## 2017-05-07 ENCOUNTER — Telehealth: Payer: Self-pay

## 2017-05-07 NOTE — Telephone Encounter (Signed)
Pharmacy notified of PA ZO#[id number] [phone] Effective 05/07/17-05/02/18 Solosec

## 2017-05-08 ENCOUNTER — Telehealth: Payer: Self-pay

## 2017-05-08 NOTE — Telephone Encounter (Signed)
Returned call to patient, left mess on her VM

## 2017-06-11 ENCOUNTER — Emergency Department (HOSPITAL_COMMUNITY)
Admission: EM | Admit: 2017-06-11 | Discharge: 2017-06-11 | Disposition: A | Discharge status: Home / Self Care | Payer: Medicaid Other | Attending: Emergency Medicine | Admitting: Emergency Medicine

## 2017-06-11 ENCOUNTER — Emergency Department (HOSPITAL_COMMUNITY): Payer: Medicaid Other

## 2017-06-11 ENCOUNTER — Encounter (HOSPITAL_COMMUNITY): Payer: Self-pay

## 2017-06-11 ENCOUNTER — Other Ambulatory Visit: Payer: Self-pay

## 2017-06-11 DIAGNOSIS — J069 Acute upper respiratory infection, unspecified: Secondary | ICD-10-CM | POA: Diagnosis not present

## 2017-06-11 DIAGNOSIS — R0602 Shortness of breath: Secondary | ICD-10-CM | POA: Diagnosis present

## 2017-06-11 DIAGNOSIS — Z79899 Other long term (current) drug therapy: Secondary | ICD-10-CM | POA: Diagnosis not present

## 2017-06-11 DIAGNOSIS — F17210 Nicotine dependence, cigarettes, uncomplicated: Secondary | ICD-10-CM | POA: Insufficient documentation

## 2017-06-11 DIAGNOSIS — H6502 Acute serous otitis media, left ear: Secondary | ICD-10-CM | POA: Insufficient documentation

## 2017-06-11 LAB — BASIC METABOLIC PANEL
ANION GAP: 8 (ref 5–15)
BUN: 7 mg/dL (ref 6–20)
CALCIUM: 8.8 mg/dL — AB (ref 8.9–10.3)
CO2: 22 mmol/L (ref 22–32)
Chloride: 107 mmol/L (ref 101–111)
Creatinine, Ser: 0.78 mg/dL (ref 0.44–1.00)
GFR calc Af Amer: >60 mL/min (ref >60)
GLUCOSE: 100 mg/dL — AB (ref 65–99)
POTASSIUM: 4 mmol/L (ref 3.5–5.1)
Sodium: 137 mmol/L (ref 135–145)

## 2017-06-11 LAB — CBC WITH DIFFERENTIAL/PLATELET
Basophils Absolute: 0 10*3/uL (ref 0.0–0.1)
Basophils Relative: 0 %
EOS PCT: 5 %
Eosinophils Absolute: 0.3 10*3/uL (ref 0.0–0.7)
HEMATOCRIT: 45.6 % (ref 36.0–46.0)
Hemoglobin: 15.3 g/dL — ABNORMAL HIGH (ref 12.0–15.0)
LYMPHS PCT: 32 %
Lymphs Abs: 1.8 10*3/uL (ref 0.7–4.0)
MCH: 29.7 pg (ref 26.0–34.0)
MCHC: 33.6 g/dL (ref 30.0–36.0)
MCV: 88.4 fL (ref 78.0–100.0)
MONO ABS: 0.4 10*3/uL (ref 0.1–1.0)
Monocytes Relative: 7 %
NEUTROS ABS: 3.2 10*3/uL (ref 1.7–7.7)
Neutrophils Relative %: 56 %
PLATELETS: 148 10*3/uL — AB (ref 150–400)
RBC: 5.16 MIL/uL — ABNORMAL HIGH (ref 3.87–5.11)
RDW: 13.9 % (ref 11.5–15.5)
WBC: 5.7 10*3/uL (ref 4.0–10.5)

## 2017-06-11 LAB — URINALYSIS, ROUTINE W REFLEX MICROSCOPIC
BILIRUBIN URINE: NEGATIVE
GLUCOSE, UA: NEGATIVE mg/dL
HGB URINE DIPSTICK: NEGATIVE
Ketones, ur: NEGATIVE mg/dL
Leukocytes, UA: NEGATIVE
Nitrite: NEGATIVE
PH: 6 (ref 5.0–8.0)
Protein, ur: NEGATIVE mg/dL
SPECIFIC GRAVITY, URINE: 1.024 (ref 1.005–1.030)

## 2017-06-11 LAB — I-STAT TROPONIN, ED: Troponin i, poc: 0 ng/mL (ref 0.00–0.08)

## 2017-06-11 LAB — I-STAT BETA HCG BLOOD, ED (MC, WL, AP ONLY)

## 2017-06-11 MED ORDER — PREDNISONE 20 MG PO TABS
60.0000 mg | ORAL_TABLET | Freq: Once | ORAL | Status: AC
  Administered 2017-06-11: 60 mg via ORAL
  Filled 2017-06-11: qty 3

## 2017-06-11 MED ORDER — PREDNISONE 20 MG PO TABS: 40.0000 mg | ORAL_TABLET | Freq: Every day | ORAL | 0 refills | Status: AC

## 2017-06-11 MED ORDER — AZITHROMYCIN 250 MG PO TABS: 250.0000 mg | ORAL_TABLET | Freq: Every day | ORAL | 0 refills | Status: DC

## 2017-06-11 MED ORDER — IPRATROPIUM-ALBUTEROL 0.5-2.5 (3) MG/3ML IN SOLN
3.0000 mL | Freq: Once | RESPIRATORY_TRACT | Status: AC
Start: 2017-06-11 — End: 2017-06-11
  Administered 2017-06-11: 3 mL via RESPIRATORY_TRACT
  Filled 2017-06-11: qty 3

## 2017-06-11 MED ORDER — AEROCHAMBER PLUS FLO-VU LARGE MISC
Status: AC
  Filled 2017-06-11: qty 1

## 2017-06-11 MED ORDER — AEROCHAMBER PLUS FLO-VU MEDIUM MISC
1.0000 | Freq: Once | Status: AC
  Administered 2017-06-11: 1
  Filled 2017-06-11 (×2): qty 1

## 2017-06-11 MED ORDER — IBUPROFEN 400 MG PO TABS
600.0000 mg | ORAL_TABLET | Freq: Once | ORAL | Status: AC
  Administered 2017-06-11: 600 mg via ORAL
  Filled 2017-06-11: qty 1

## 2017-06-11 MED ORDER — ALBUTEROL SULFATE HFA 108 (90 BASE) MCG/ACT IN AERS
2.0000 | INHALATION_SPRAY | RESPIRATORY_TRACT | Status: DC | PRN
  Administered 2017-06-11: 2 via RESPIRATORY_TRACT
  Filled 2017-06-11: qty 6.7

## 2017-06-11 NOTE — ED Notes (Signed)
Patient transported to X-ray 
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
## 2017-06-11 NOTE — ED Notes (Signed)
Pt reports pressure behind eyes beginning 2 days ago and continuing to worsen. Pt also states that she has body aches and a cough

## 2017-06-11 NOTE — Discharge Instructions (Addendum)
Please start your steroids (prednisone) tomorrow.  I have given you a prescription for steroids today.  Some common side effects include feelings of extra energy, feeling warm, increased appetite, and stomach upset.  If you are diabetic your sugars may run higher than usual.   You may have diarrhea from the antibiotics.  It is very important that you continue to take the antibiotics even if you get diarrhea unless a medical professional tells you that you may stop taking them.  If you stop too early the bacteria you are being treated for will become stronger and you may need different, more powerful antibiotics that have more side effects and worsening diarrhea.  Please stay well hydrated and consider probiotics as they may decrease the severity of your diarrhea.  Please be aware that if you take any hormonal contraception (birth control pills, nexplanon, the ring, etc) that your birth control will not work while you are taking antibiotics and you need to use back up protection as directed on the birth control medication information insert.   Please take Ibuprofen (Advil, motrin) and Tylenol (acetaminophen) to relieve your pain.  You may take up to 600 MG (3 pills) of normal strength ibuprofen every 8 hours as needed.  In between doses of ibuprofen you make take tylenol, up to 1,000 mg (two extra strength pills).  Do not take more than 3,000 mg tylenol in a 24 hour period.  Please check all medication labels as many medications such as pain and cold medications may contain tylenol.  Do not drink alcohol while taking these medications.  Do not take other NSAID'S while taking ibuprofen (such as aleve or naproxen).  Please take ibuprofen with food to decrease stomach upset.  Please make sure you are staying well hydrated.

## 2017-06-11 NOTE — ED Triage Notes (Signed)
Patient complains of cough, congestion and body aches. NAD

## 2017-07-30 DIAGNOSIS — H9012 Conductive hearing loss, unilateral, left ear, with unrestricted hearing on the contralateral side: Secondary | ICD-10-CM | POA: Insufficient documentation

## 2017-07-30 DIAGNOSIS — H6993 Unspecified Eustachian tube disorder, bilateral: Secondary | ICD-10-CM | POA: Insufficient documentation

## 2017-07-30 HISTORY — DX: Unspecified eustachian tube disorder, bilateral: H69.93

## 2018-04-08 ENCOUNTER — Emergency Department (HOSPITAL_COMMUNITY)
Admission: EM | Admit: 2018-04-08 | Discharge: 2018-04-08 | Disposition: A | Discharge status: Home / Self Care | Payer: Medicaid Other | Attending: Emergency Medicine | Admitting: Emergency Medicine

## 2018-04-08 ENCOUNTER — Encounter (HOSPITAL_COMMUNITY): Payer: Self-pay | Admitting: Emergency Medicine

## 2018-04-08 ENCOUNTER — Other Ambulatory Visit: Payer: Self-pay

## 2018-04-08 DIAGNOSIS — M545 Low back pain, unspecified: Secondary | ICD-10-CM

## 2018-04-08 DIAGNOSIS — F17210 Nicotine dependence, cigarettes, uncomplicated: Secondary | ICD-10-CM | POA: Diagnosis not present

## 2018-04-08 DIAGNOSIS — Z79899 Other long term (current) drug therapy: Secondary | ICD-10-CM | POA: Insufficient documentation

## 2018-04-08 DIAGNOSIS — M549 Dorsalgia, unspecified: Secondary | ICD-10-CM | POA: Insufficient documentation

## 2018-04-08 LAB — I-STAT BETA HCG BLOOD, ED (MC, WL, AP ONLY): I-stat hCG, quantitative: <5 m[IU]/mL (ref <5)

## 2018-04-08 LAB — URINALYSIS, ROUTINE W REFLEX MICROSCOPIC
Bilirubin Urine: NEGATIVE
GLUCOSE, UA: NEGATIVE mg/dL
HGB URINE DIPSTICK: NEGATIVE
Ketones, ur: NEGATIVE mg/dL
Leukocytes, UA: NEGATIVE
Nitrite: NEGATIVE
PROTEIN: NEGATIVE mg/dL
Specific Gravity, Urine: 1.024 (ref 1.005–1.030)
pH: 6 (ref 5.0–8.0)

## 2018-04-08 MED ORDER — METHOCARBAMOL 500 MG PO TABS: 500.0000 mg | ORAL_TABLET | Freq: Two times a day (BID) | ORAL | 0 refills | Status: DC

## 2018-04-08 MED ORDER — IBUPROFEN 600 MG PO TABS: 600.0000 mg | ORAL_TABLET | Freq: Four times a day (QID) | ORAL | 0 refills | Status: DC | PRN

## 2018-04-08 NOTE — ED Notes (Signed)
Triage note entered by RN. Not Lurena Joiner.

## 2018-04-08 NOTE — ED Notes (Signed)
Patient given discharge instructions and verbalized understanding.  Patient stable to discharge at this time.  Patient is alert and oriented to baseline.  No distressed noted at this time.  All belongings taken with the patient at discharge.   

## 2018-04-08 NOTE — Discharge Instructions (Signed)
Return if any problems.  See your Physician for recheck in 3-4 days if symptoms persist  °
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
## 2018-04-08 NOTE — ED Triage Notes (Signed)
Pt with pain to the right flank, reports dysuria. Denies blood or vaginal discharge.

## 2018-05-05 ENCOUNTER — Ambulatory Visit: Payer: Medicaid Other | Admitting: Obstetrics

## 2018-05-05 ENCOUNTER — Other Ambulatory Visit (HOSPITAL_COMMUNITY)
Admission: RE | Admit: 2018-05-05 | Discharge: 2018-05-05 | Disposition: A | Discharge status: Home / Self Care | Payer: Medicaid Other | Source: Ambulatory Visit | Attending: Obstetrics | Admitting: Obstetrics

## 2018-05-05 ENCOUNTER — Encounter: Payer: Self-pay | Admitting: Obstetrics

## 2018-05-05 VITALS — Ht 71.0 in | Wt 266.2 lb

## 2018-05-05 DIAGNOSIS — N898 Other specified noninflammatory disorders of vagina: Secondary | ICD-10-CM | POA: Insufficient documentation

## 2018-05-05 DIAGNOSIS — N926 Irregular menstruation, unspecified: Secondary | ICD-10-CM

## 2018-05-05 DIAGNOSIS — N946 Dysmenorrhea, unspecified: Secondary | ICD-10-CM

## 2018-05-05 DIAGNOSIS — Z3046 Encounter for surveillance of implantable subdermal contraceptive: Secondary | ICD-10-CM

## 2018-05-05 MED ORDER — IBUPROFEN 800 MG PO TABS: 800.0000 mg | ORAL_TABLET | Freq: Three times a day (TID) | ORAL | 5 refills | Status: DC | PRN
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
## 2018-05-05 NOTE — Progress Notes (Signed)
Pt is in the office, complains of pelvic pain, brown discharge and nausea. Pt currently has nexplanon, placed in 2017.

## 2018-05-07 ENCOUNTER — Other Ambulatory Visit: Payer: Self-pay | Admitting: Obstetrics

## 2018-05-07 LAB — CERVICOVAGINAL ANCILLARY ONLY
Bacterial vaginitis: NEGATIVE
CANDIDA VAGINITIS: POSITIVE — AB
Chlamydia: NEGATIVE
Neisseria Gonorrhea: NEGATIVE
Trichomonas: NEGATIVE

## 2018-06-16 ENCOUNTER — Ambulatory Visit: Payer: Medicaid Other | Admitting: Obstetrics

## 2018-08-19 ENCOUNTER — Ambulatory Visit: Payer: Medicaid Other

## 2018-08-19 ENCOUNTER — Other Ambulatory Visit: Payer: Self-pay

## 2018-08-19 ENCOUNTER — Other Ambulatory Visit (HOSPITAL_COMMUNITY)
Admission: RE | Admit: 2018-08-19 | Discharge: 2018-08-19 | Disposition: A | Discharge status: Home / Self Care | Payer: Medicaid Other | Source: Ambulatory Visit | Attending: Obstetrics | Admitting: Obstetrics

## 2018-08-19 DIAGNOSIS — N898 Other specified noninflammatory disorders of vagina: Secondary | ICD-10-CM

## 2018-08-19 DIAGNOSIS — Z113 Encounter for screening for infections with a predominantly sexual mode of transmission: Secondary | ICD-10-CM

## 2018-08-19 NOTE — Progress Notes (Signed)
Pt is here for STD testing, she requests self swab and blood work. Pt advised we will notify her of the results.

## 2018-08-20 LAB — CERVICOVAGINAL ANCILLARY ONLY
Bacterial vaginitis: POSITIVE — AB
Candida vaginitis: POSITIVE — AB
Chlamydia: POSITIVE — AB
Neisseria Gonorrhea: POSITIVE — AB
Trichomonas: POSITIVE — AB

## 2018-08-20 LAB — HEPATITIS C ANTIBODY: Hep C Virus Ab: <0.1 s/co ratio (ref 0.0–0.9)

## 2018-08-20 LAB — RPR: RPR Ser Ql: NONREACTIVE

## 2018-08-20 LAB — HEPATITIS B SURFACE ANTIGEN: Hepatitis B Surface Ag: NEGATIVE

## 2018-08-20 LAB — HIV ANTIBODY (ROUTINE TESTING W REFLEX): HIV Screen 4th Generation wRfx: NONREACTIVE

## 2018-08-21 ENCOUNTER — Other Ambulatory Visit: Payer: Self-pay | Admitting: Obstetrics

## 2018-08-21 ENCOUNTER — Telehealth: Payer: Self-pay

## 2018-08-21 DIAGNOSIS — A549 Gonococcal infection, unspecified: Secondary | ICD-10-CM

## 2018-08-21 DIAGNOSIS — B373 Candidiasis of vulva and vagina: Secondary | ICD-10-CM

## 2018-08-21 DIAGNOSIS — A749 Chlamydial infection, unspecified: Secondary | ICD-10-CM

## 2018-08-21 DIAGNOSIS — A5901 Trichomonal vulvovaginitis: Secondary | ICD-10-CM

## 2018-08-21 DIAGNOSIS — B3731 Acute candidiasis of vulva and vagina: Secondary | ICD-10-CM

## 2018-08-21 DIAGNOSIS — B9689 Other specified bacterial agents as the cause of diseases classified elsewhere: Secondary | ICD-10-CM

## 2018-08-21 MED ORDER — AZITHROMYCIN 500 MG PO TABS: 1000.0000 mg | ORAL_TABLET | Freq: Once | ORAL | 0 refills | Status: AC

## 2018-08-21 MED ORDER — TINIDAZOLE 500 MG PO TABS: 2.0000 g | ORAL_TABLET | Freq: Every day | ORAL | 0 refills | Status: DC

## 2018-08-21 MED ORDER — TERCONAZOLE 0.8 % VA CREA: 1.0000 | TOPICAL_CREAM | Freq: Every day | VAGINAL | 2 refills | Status: DC

## 2018-08-21 MED ORDER — CEFTRIAXONE SODIUM 250 MG IJ SOLR
250.0000 mg | Freq: Once | INTRAMUSCULAR | Status: AC
  Administered 2018-08-24: 250 mg via INTRAMUSCULAR

## 2018-08-21 NOTE — Telephone Encounter (Signed)
Attempted to contact about results, no answer, left vm. Faxed form to [facility]

## 2018-08-24 ENCOUNTER — Other Ambulatory Visit: Payer: Self-pay

## 2018-08-24 ENCOUNTER — Ambulatory Visit (INDEPENDENT_AMBULATORY_CARE_PROVIDER_SITE_OTHER): Payer: Medicaid Other

## 2018-08-24 DIAGNOSIS — A749 Chlamydial infection, unspecified: Secondary | ICD-10-CM

## 2018-08-24 DIAGNOSIS — A549 Gonococcal infection, unspecified: Secondary | ICD-10-CM | POA: Diagnosis not present

## 2018-08-24 MED ORDER — AZITHROMYCIN 500 MG PO TABS
1000.0000 mg | ORAL_TABLET | Freq: Once | ORAL | Status: AC
  Administered 2018-08-25: 1000 mg via ORAL

## 2018-08-24 NOTE — Progress Notes (Signed)
I have reviewed this chart and agree with the RN/CMA assessment and management.    K. Meryl Ivyonna Hoelzel, M.D. Attending [facility] (Faculty Practice)   

## 2018-08-24 NOTE — Progress Notes (Signed)
Nurse visit for Candace Richardson and 1000 mg Zithromax PCN allergy - Pt can tolerate Candace Richardson TOC GC/CT to be scheduled during checkout Pt agrees to avoid IC until her's and partner's TOC comes back negative

## 2018-08-25 DIAGNOSIS — A549 Gonococcal infection, unspecified: Secondary | ICD-10-CM | POA: Diagnosis not present

## 2018-08-31 ENCOUNTER — Telehealth: Payer: Self-pay

## 2018-08-31 NOTE — Telephone Encounter (Signed)
  Returned call to patient, she was unable to pick up Zithro because she didn't have copay. she will pick up today.

## 2018-09-01 ENCOUNTER — Ambulatory Visit: Payer: Medicaid Other | Admitting: Advanced Practice Midwife

## 2018-09-21 ENCOUNTER — Ambulatory Visit: Payer: Medicaid Other

## 2018-10-08 DIAGNOSIS — M25569 Pain in unspecified knee: Secondary | ICD-10-CM | POA: Diagnosis not present

## 2018-10-12 DIAGNOSIS — E669 Obesity, unspecified: Secondary | ICD-10-CM | POA: Diagnosis not present

## 2018-10-12 DIAGNOSIS — Z6834 Body mass index (BMI) 34.0-34.9, adult: Secondary | ICD-10-CM | POA: Diagnosis not present

## 2018-10-12 DIAGNOSIS — H7292 Unspecified perforation of tympanic membrane, left ear: Secondary | ICD-10-CM | POA: Diagnosis not present

## 2018-10-12 DIAGNOSIS — M25569 Pain in unspecified knee: Secondary | ICD-10-CM | POA: Diagnosis not present

## 2018-10-12 DIAGNOSIS — Z13220 Encounter for screening for lipoid disorders: Secondary | ICD-10-CM | POA: Diagnosis not present

## 2018-10-12 DIAGNOSIS — E559 Vitamin D deficiency, unspecified: Secondary | ICD-10-CM | POA: Diagnosis not present

## 2018-10-12 DIAGNOSIS — Z131 Encounter for screening for diabetes mellitus: Secondary | ICD-10-CM | POA: Diagnosis not present

## 2018-10-27 ENCOUNTER — Ambulatory Visit: Payer: Medicaid Other | Admitting: Medical

## 2018-11-20 ENCOUNTER — Ambulatory Visit: Payer: Medicaid Other | Admitting: Medical

## 2018-12-10 DIAGNOSIS — H6983 Other specified disorders of Eustachian tube, bilateral: Secondary | ICD-10-CM | POA: Diagnosis not present

## 2018-12-10 DIAGNOSIS — H9012 Conductive hearing loss, unilateral, left ear, with unrestricted hearing on the contralateral side: Secondary | ICD-10-CM | POA: Diagnosis not present

## 2018-12-10 DIAGNOSIS — H9011 Conductive hearing loss, unilateral, right ear, with unrestricted hearing on the contralateral side: Secondary | ICD-10-CM | POA: Diagnosis not present

## 2018-12-16 ENCOUNTER — Other Ambulatory Visit: Payer: Self-pay

## 2018-12-16 ENCOUNTER — Encounter: Payer: Self-pay | Admitting: Certified Nurse Midwife

## 2018-12-16 ENCOUNTER — Ambulatory Visit: Payer: Medicaid Other | Admitting: Certified Nurse Midwife

## 2018-12-16 ENCOUNTER — Other Ambulatory Visit (HOSPITAL_COMMUNITY)
Admission: RE | Admit: 2018-12-16 | Discharge: 2018-12-16 | Disposition: A | Discharge status: Home / Self Care | Payer: Medicaid Other | Source: Ambulatory Visit | Attending: Medical | Admitting: Medical

## 2018-12-16 VITALS — BP 131/80 | HR 80 | Wt 260.0 lb

## 2018-12-16 DIAGNOSIS — Z3046 Encounter for surveillance of implantable subdermal contraceptive: Secondary | ICD-10-CM

## 2018-12-16 DIAGNOSIS — Z3202 Encounter for pregnancy test, result negative: Secondary | ICD-10-CM | POA: Diagnosis not present

## 2018-12-16 DIAGNOSIS — Z113 Encounter for screening for infections with a predominantly sexual mode of transmission: Secondary | ICD-10-CM | POA: Diagnosis not present

## 2018-12-16 LAB — POCT URINE PREGNANCY: Preg Test, Ur: NEGATIVE

## 2018-12-16 MED ORDER — ETONOGESTREL 68 MG ~~LOC~~ IMPL
68.0000 mg | DRUG_IMPLANT | Freq: Once | SUBCUTANEOUS | Status: AC
  Administered 2018-12-16: 68 mg via SUBCUTANEOUS

## 2018-12-16 NOTE — Addendum Note (Signed)
Addended by: Tristan Schroeder D on: 12/16/2018 04:17 PM   Modules accepted: Orders

## 2018-12-16 NOTE — Progress Notes (Signed)
GYNECOLOGY CLINIC PROCEDURE NOTE  [name] is a 28 y.o. [phone] here for Nexplanon removal and reinsertion. No GYN concerns. She is also here for TOC, diagnosed with Chlamydia, Gonorrhea and Trichomonas. No other gynecologic concerns.  Nexplanon Removal and Insertion  Patient was given informed consent for removal of her Nexplanon and insertion of Nexplanon.  Patient does understand that irregular bleeding is a very common side effect of this medication. She was advised to have backup contraception for one week after replacement of the implant.  Appropriate time out taken. Nexplanon site identified- left arm. Area prepped in usual sterile fashon. One ml of 1% lidocaine was used to anesthetize the area at the distal end of the implant. A small stab incision was made right beside the implant on the distal portion. The Nexplanon rod was grasped using hemostats and removed without difficulty. There was minimal blood loss. There were no complications. Area was then injected with 3 ml of 1 % lidocaine. She was re-prepped with betadine, Nexplanon removed from packaging, Device confirmed in needle, then inserted full length of needle and withdrawn per handbook instructions. Nexplanon was able to palpated in the patient's arm; patient palpated the insert herself.  There was minimal blood loss. Patient insertion site covered with guaze and a pressure bandage to reduce any bruising. The patient tolerated the procedure well and was given post procedure instructions.   Will manage TOC accordingly once resulted  Follow up as scheduled for annual examination   Lajean Manes, CNM 12/16/2018 3:05 PM

## 2018-12-16 NOTE — Progress Notes (Signed)
Pt is in the office for nexplanon removal, placed in 2017. Pt is also here for TOC.

## 2018-12-16 NOTE — Patient Instructions (Signed)

## 2018-12-23 ENCOUNTER — Emergency Department (HOSPITAL_COMMUNITY)
Admission: EM | Admit: 2018-12-23 | Discharge: 2018-12-23 | Disposition: A | Discharge status: Home / Self Care | Payer: Medicaid Other | Attending: Emergency Medicine | Admitting: Emergency Medicine

## 2018-12-23 ENCOUNTER — Emergency Department (HOSPITAL_COMMUNITY): Payer: Medicaid Other

## 2018-12-23 ENCOUNTER — Encounter (HOSPITAL_COMMUNITY): Payer: Self-pay | Admitting: Emergency Medicine

## 2018-12-23 ENCOUNTER — Other Ambulatory Visit: Payer: Self-pay

## 2018-12-23 DIAGNOSIS — R52 Pain, unspecified: Secondary | ICD-10-CM | POA: Diagnosis not present

## 2018-12-23 DIAGNOSIS — Z720 Tobacco use: Secondary | ICD-10-CM | POA: Diagnosis not present

## 2018-12-23 DIAGNOSIS — Z20828 Contact with and (suspected) exposure to other viral communicable diseases: Secondary | ICD-10-CM | POA: Diagnosis not present

## 2018-12-23 DIAGNOSIS — R509 Fever, unspecified: Secondary | ICD-10-CM | POA: Diagnosis not present

## 2018-12-23 DIAGNOSIS — R05 Cough: Secondary | ICD-10-CM | POA: Diagnosis not present

## 2018-12-23 DIAGNOSIS — Z880 Allergy status to penicillin: Secondary | ICD-10-CM | POA: Insufficient documentation

## 2018-12-23 DIAGNOSIS — R059 Cough, unspecified: Secondary | ICD-10-CM

## 2018-12-23 DIAGNOSIS — Z209 Contact with and (suspected) exposure to unspecified communicable disease: Secondary | ICD-10-CM | POA: Diagnosis not present

## 2018-12-23 MED ORDER — DEXAMETHASONE SODIUM PHOSPHATE 10 MG/ML IJ SOLN: 8.0000 mg | Freq: Once | INTRAMUSCULAR | Status: DC

## 2018-12-23 MED ORDER — DEXAMETHASONE SODIUM PHOSPHATE 10 MG/ML IJ SOLN
8.0000 mg | Freq: Once | INTRAMUSCULAR | Status: AC
  Administered 2018-12-23: 8 mg via INTRAMUSCULAR
  Filled 2018-12-23: qty 1

## 2018-12-23 NOTE — ED Notes (Signed)
XR at bedside

## 2018-12-23 NOTE — Discharge Instructions (Addendum)
Your chest x-ray is normal.  Covid test pending.  If it is positive, we will notify you.  You have been given a dose of intramuscular steroids today to help your breathing.  Continue to use your inhaler at home.

## 2018-12-23 NOTE — ED Triage Notes (Signed)
Per EMS, patient c/o cough, muscle aches, and fever x2 days. Took tylenol. No known Covid exposure. Hx asthma. Ambulatory. Denies N/V/D, chest pain, SOB.
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
## 2018-12-24 LAB — CERVICOVAGINAL ANCILLARY ONLY
Chlamydia: NEGATIVE
Comment: NEGATIVE
Comment: NEGATIVE
Comment: NORMAL
Neisseria Gonorrhea: NEGATIVE
Trichomonas: NEGATIVE

## 2018-12-24 LAB — SARS CORONAVIRUS 2 (TAT 6-24 HRS): SARS Coronavirus 2: NEGATIVE

## 2019-02-09 ENCOUNTER — Other Ambulatory Visit: Payer: Self-pay

## 2019-02-09 ENCOUNTER — Other Ambulatory Visit (HOSPITAL_COMMUNITY)
Admission: RE | Admit: 2019-02-09 | Discharge: 2019-02-09 | Disposition: A | Discharge status: Home / Self Care | Payer: Medicaid Other | Source: Ambulatory Visit | Attending: Obstetrics & Gynecology | Admitting: Obstetrics & Gynecology

## 2019-02-09 ENCOUNTER — Ambulatory Visit: Payer: Medicaid Other | Admitting: Obstetrics & Gynecology

## 2019-02-09 VITALS — BP 157/82 | HR 82 | Wt 259.0 lb

## 2019-02-09 DIAGNOSIS — Z3202 Encounter for pregnancy test, result negative: Secondary | ICD-10-CM | POA: Diagnosis not present

## 2019-02-09 DIAGNOSIS — A749 Chlamydial infection, unspecified: Secondary | ICD-10-CM | POA: Diagnosis not present

## 2019-02-09 LAB — POCT URINALYSIS DIPSTICK
Bilirubin, UA: NEGATIVE
Blood, UA: NEGATIVE
Glucose, UA: NEGATIVE
Ketones, UA: NEGATIVE
Leukocytes, UA: NEGATIVE
Nitrite, UA: NEGATIVE
Protein, UA: NEGATIVE
Spec Grav, UA: 1.02 (ref 1.010–1.025)
Urobilinogen, UA: 0.2 E.U./dL
pH, UA: 6 (ref 5.0–8.0)

## 2019-02-09 LAB — POCT URINE PREGNANCY: Preg Test, Ur: NEGATIVE

## 2019-02-09 NOTE — Progress Notes (Signed)
Patient ID: Candace Richardson, female   DOB: [birth year], 28 y.o.   MRN: [id number]  Chief Complaint  Patient presents with  . Gynecologic Exam    HPI Candace Richardson is a 28 y.o. female.  [id number] No LMP recorded. Patient has had an implant. She reports sharp lower abdominal pain and less energy since insertion of Nexplanon 2 months ago. No bleeding now but prior to replacing the device she had several weeks of bleeding. HPI  Past Medical History:  Diagnosis Date  . Bipolar 1 disorder (HCC)   . Depression   . Gonorrhea   . Polycystic ovary disease   . PTSD (post-traumatic stress disorder)   . Trichomonas infection     Past Surgical History:  Procedure Laterality Date  . EAR EXAMINATION UNDER ANESTHESIA    . WISDOM TOOTH EXTRACTION      Family History  Problem Relation Age of Onset  . Heart disease Maternal Uncle   . Diabetes Maternal Grandmother     Social History Social History   Tobacco Use  . Smoking status: Light Tobacco Smoker    Types: Cigarettes  . Smokeless tobacco: Never Used  Substance Use Topics  . Alcohol use: No    Alcohol/week: 0.0 standard drinks  . Drug use: No    Allergies  Allergen Reactions  . Penicillins Hives and Other (See Comments)    Rocephin w/out reaction. Has patient had a PCN reaction causing immediate rash, facial/tongue/throat swelling, SOB or lightheadedness with hypotension: yes Has patient had a PCN reaction causing severe rash involving mucus membranes or skin necrosis: No Has patient had a PCN reaction that required hospitalization No Has patient had a PCN reaction occurring within the last 10 years: No If all of the above answers are "NO", then may proceed with Cephalosporin use.     Current Outpatient Medications  Medication Sig Dispense Refill  . ibuprofen (ADVIL,MOTRIN) 800 MG tablet Take 1 tablet (800 mg total) by mouth every 8 (eight) hours as needed. (Patient not taking: Reported on 12/16/2018) 30 tablet 5  .  methocarbamol (ROBAXIN) 500 MG tablet Take 1 tablet (500 mg total) by mouth 2 (two) times daily. (Patient not taking: Reported on 05/05/2018) 20 tablet 0   No current facility-administered medications for this visit.     Review of Systems Review of Systems  Constitutional: Positive for fatigue.  Gastrointestinal: Negative for abdominal distention.  Genitourinary: Positive for pelvic pain. Negative for vaginal bleeding and vaginal discharge.    Blood pressure (!) 157/82, pulse 82, weight 259 lb (117.5 kg).  Physical Exam Physical Exam Constitutional:      Appearance: She is obese. She is not ill-appearing.  Pulmonary:     Effort: Pulmonary effort is normal.  Abdominal:     Palpations: Abdomen is soft.     Tenderness: There is no abdominal tenderness.  Neurological:     Mental Status: She is alert.   Pelvic exam: normal external genitalia, vulva, vagina, cervix, uterus and adnexa.   Data Reviewed Results for Candace, Richardson (MRN [id number]) as of 02/09/2019 16:53  Ref. Range 02/09/2019 13:31  Bilirubin, UA Unknown negative  Clarity, UA Unknown clear  Color, UA Unknown yellow  Glucose Latest Ref Range: Negative  Negative  Ketones, UA Unknown negative  Leukocytes,UA Latest Ref Range: Negative  Negative  Nitrite, UA Unknown negative  pH, UA Latest Ref Range: 5.0 - 8.0  6.0  Protein,UA Latest Ref Range: Negative  Negative  Specific Gravity, UA Latest Ref Range:  1.010 - 1.025  1.020  Urobilinogen, UA Latest Ref Range: 0.2 or 1.0 E.U./dL 0.2   (important suggestion)  Newer results are available. Click to view them now.  Component 65mo ago  Bacterial vaginitis **POSITIVE for Gardnerella vaginalis**Abnormal    Comment: Normal Reference Range - Negative  Candida vaginitis **POSITIVE for Candida species**Abnormal    Comment: Normal Reference Range - Negative  Chlamydia **POSITIVE**Abnormal    Comment: Normal Reference Range - Negative  Neisseria Gonorrhea **POSITIVE**Abnormal     Comment: Normal Reference Range - Negative  Trichomonas **POSITIVE**Abnormal    Comment: Normal Reference Range - Negative    Assessment Pain and systemic symptoms after reinsertion of Nexplanon, r/o pelvic infection  Plan Orders Placed This Encounter  Procedures  . POCT urine pregnancy  . POCT Urinalysis Dipstick    [facility]  F/u results   Emeterio Reeve 02/09/2019, 11:58 AM

## 2019-02-09 NOTE — Patient Instructions (Signed)
Etonogestrel implant What is this medicine? ETONOGESTREL (et oh noe JES trel) is a contraceptive (birth control) device. It is used to prevent pregnancy. It can be used for up to 3 years. This medicine may be used for other purposes; ask your health care provider or pharmacist if you have questions. COMMON BRAND NAME(S): Implanon, Nexplanon What should I tell my health care provider before I take this medicine? They need to know if you have any of these conditions:  abnormal vaginal bleeding  blood vessel disease or blood clots  breast, cervical, endometrial, ovarian, liver, or uterine cancer  diabetes  gallbladder disease  heart disease or recent heart attack  high blood pressure  high cholesterol or triglycerides  kidney disease  liver disease  migraine headaches  seizures  stroke  tobacco smoker  an unusual or allergic reaction to etonogestrel, anesthetics or antiseptics, other medicines, foods, dyes, or preservatives  pregnant or trying to get pregnant  breast-feeding How should I use this medicine? This device is inserted just under the skin on the inner side of your upper arm by a health care professional. Talk to your pediatrician regarding the use of this medicine in children. Special care may be needed. Overdosage: If you think you have taken too much of this medicine contact a poison control center or emergency room at once. NOTE: This medicine is only for you. Do not share this medicine with others. What if I miss a dose? This does not apply. What may interact with this medicine? Do not take this medicine with any of the following medications:  amprenavir  fosamprenavir This medicine may also interact with the following medications:  acitretin  aprepitant  armodafinil  bexarotene  bosentan  carbamazepine  certain medicines for fungal infections like fluconazole, ketoconazole, itraconazole and voriconazole  certain medicines to treat  hepatitis, HIV or AIDS  cyclosporine  felbamate  griseofulvin  lamotrigine  modafinil  oxcarbazepine  phenobarbital  phenytoin  primidone  rifabutin  rifampin  rifapentine  St. John's wort  topiramate This list may not describe all possible interactions. Give your health care provider a list of all the medicines, herbs, non-prescription drugs, or dietary supplements you use. Also tell them if you smoke, drink alcohol, or use illegal drugs. Some items may interact with your medicine. What should I watch for while using this medicine? This product does not protect you against HIV infection (AIDS) or other sexually transmitted diseases. You should be able to feel the implant by pressing your fingertips over the skin where it was inserted. Contact your doctor if you cannot feel the implant, and use a non-hormonal birth control method (such as condoms) until your doctor confirms that the implant is in place. Contact your doctor if you think that the implant may have broken or become bent while in your arm. You will receive a user card from your health care provider after the implant is inserted. The card is a record of the location of the implant in your upper arm and when it should be removed. Keep this card with your health records. What side effects may I notice from receiving this medicine? Side effects that you should report to your doctor or health care professional as soon as possible:  allergic reactions like skin rash, itching or hives, swelling of the face, lips, or tongue  breast lumps, breast tissue changes, or discharge  breathing problems  changes in emotions or moods  if you feel that the implant may have broken or   bent while in your arm  high blood pressure  pain, irritation, swelling, or bruising at the insertion site  scar at site of insertion  signs of infection at the insertion site such as fever, and skin redness, pain or discharge  signs and  symptoms of a blood clot such as breathing problems; changes in vision; chest pain; severe, sudden headache; pain, swelling, warmth in the leg; trouble speaking; sudden numbness or weakness of the face, arm or leg  signs and symptoms of liver injury like dark yellow or brown urine; general ill feeling or flu-like symptoms; light-colored stools; loss of appetite; nausea; right upper belly pain; unusually weak or tired; yellowing of the eyes or skin  unusual vaginal bleeding, discharge Side effects that usually do not require medical attention (report to your doctor or health care professional if they continue or are bothersome):  acne  breast pain or tenderness  headache  irregular menstrual bleeding  nausea This list may not describe all possible side effects. Call your doctor for medical advice about side effects. You may report side effects to FDA at 1-800-FDA-1088. Where should I keep my medicine? This drug is given in a hospital or clinic and will not be stored at home. NOTE: This sheet is a summary. It may not cover all possible information. If you have questions about this medicine, talk to your doctor, pharmacist, or health care provider.  2020 Elsevier/Gold Standard (2017-01-14 14:11:42)  

## 2019-02-09 NOTE — Progress Notes (Signed)
Pt states she has been having pelvic pain/crampiness since Nexplanon replaced. Pt is sexually active, denies any new partners.

## 2019-02-10 LAB — CERVICOVAGINAL ANCILLARY ONLY
Bacterial Vaginitis (gardnerella): POSITIVE — AB
Candida Glabrata: NEGATIVE
Candida Vaginitis: POSITIVE — AB
Chlamydia: NEGATIVE
Comment: NEGATIVE
Comment: NEGATIVE
Comment: NEGATIVE
Comment: NEGATIVE
Comment: NEGATIVE
Comment: NORMAL
Neisseria Gonorrhea: NEGATIVE
Trichomonas: NEGATIVE

## 2019-02-17 MED ORDER — METRONIDAZOLE 500 MG PO TABS: 500.0000 mg | ORAL_TABLET | Freq: Two times a day (BID) | ORAL | 0 refills | Status: DC

## 2019-02-17 MED ORDER — FLUCONAZOLE 150 MG PO TABS: 150.0000 mg | ORAL_TABLET | Freq: Once | ORAL | 0 refills | Status: AC

## 2019-02-17 NOTE — Addendum Note (Signed)
Addended by: Woodroe Mode on: 02/17/2019 07:15 AM   Modules accepted: Orders

## 2019-04-02 ENCOUNTER — Encounter (HOSPITAL_COMMUNITY): Payer: Self-pay | Admitting: Emergency Medicine

## 2019-04-02 ENCOUNTER — Emergency Department (HOSPITAL_COMMUNITY)
Admission: EM | Admit: 2019-04-02 | Discharge: 2019-04-02 | Disposition: A | Discharge status: Home / Self Care | Payer: Medicaid Other | Attending: Emergency Medicine | Admitting: Emergency Medicine

## 2019-04-02 ENCOUNTER — Other Ambulatory Visit: Payer: Self-pay

## 2019-04-02 DIAGNOSIS — M79621 Pain in right upper arm: Secondary | ICD-10-CM | POA: Diagnosis not present

## 2019-04-02 DIAGNOSIS — M25531 Pain in right wrist: Secondary | ICD-10-CM | POA: Diagnosis not present

## 2019-04-02 DIAGNOSIS — F17210 Nicotine dependence, cigarettes, uncomplicated: Secondary | ICD-10-CM | POA: Diagnosis not present

## 2019-04-02 DIAGNOSIS — M79601 Pain in right arm: Secondary | ICD-10-CM | POA: Diagnosis not present

## 2019-04-02 DIAGNOSIS — Z79899 Other long term (current) drug therapy: Secondary | ICD-10-CM | POA: Insufficient documentation

## 2019-04-02 MED ORDER — NAPROXEN 500 MG PO TABS: 500.0000 mg | ORAL_TABLET | Freq: Two times a day (BID) | ORAL | 0 refills | Status: DC

## 2019-04-02 MED ORDER — IBUPROFEN 800 MG PO TABS
800.0000 mg | ORAL_TABLET | Freq: Once | ORAL | Status: AC
  Administered 2019-04-02: 800 mg via ORAL
  Filled 2019-04-02: qty 1

## 2019-04-02 NOTE — ED Notes (Signed)
Patient verbalizes understanding of discharge instructions. Opportunity for questioning and answers were provided.  pt discharged from ED, ambulatory by self   

## 2019-04-02 NOTE — ED Triage Notes (Signed)
Pt here with c/o right arm swelling , no trauma just woke up with it , arm only looks slightly swollen if at all , radial pulse is palpable

## 2019-04-02 NOTE — Discharge Instructions (Signed)
Take naproxen 2 times a day with meals.  Do not take other anti-inflammatories at the same time (Advil, Motrin, ibuprofen, Aleve). You may supplement with Tylenol if you need further pain control. Use ice packs as needed for pain and swelling. Use the wrist splint, especially at night.  You may use it during the day, but if it is interfering with work wear only at night. Follow-up with your primary care doctor in 1 week for recheck of your symptoms.  If you are continuing to have pain, you may need further evaluation including an ultrasound. Return to the emergency room if you develop severe worsening pain, if your hand is numb or you have difficulty moving your fingers when you are not having pain, if you develop high fevers, or with any new, worsening, or concerning symptoms.

## 2019-04-02 NOTE — ED Notes (Signed)
Ortho tech at bedside, placed splint

## 2019-04-02 NOTE — Progress Notes (Signed)
Orthopedic Tech Progress Note Patient Details:  ESTLE SABELLA 07-19-1990 [id number]  Ortho Devices Type of Ortho Device: Velcro wrist forearm splint Ortho Device/Splint Location: RUE Ortho Device/Splint Interventions: Application, Ordered   Post Interventions Patient Tolerated: Well Instructions Provided: Care of device, Adjustment of device   Donald Pore 04/02/2019, 3:42 PM
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
## 2019-05-19 DIAGNOSIS — H900 Conductive hearing loss, bilateral: Secondary | ICD-10-CM | POA: Diagnosis not present

## 2019-05-19 DIAGNOSIS — H9012 Conductive hearing loss, unilateral, left ear, with unrestricted hearing on the contralateral side: Secondary | ICD-10-CM | POA: Diagnosis not present

## 2019-05-19 DIAGNOSIS — H7292 Unspecified perforation of tympanic membrane, left ear: Secondary | ICD-10-CM | POA: Diagnosis not present

## 2019-05-19 DIAGNOSIS — H6983 Other specified disorders of Eustachian tube, bilateral: Secondary | ICD-10-CM | POA: Diagnosis not present

## 2019-06-01 ENCOUNTER — Ambulatory Visit: Payer: Medicaid Other | Admitting: Certified Nurse Midwife

## 2019-06-01 ENCOUNTER — Other Ambulatory Visit (HOSPITAL_COMMUNITY)
Admission: RE | Admit: 2019-06-01 | Discharge: 2019-06-01 | Disposition: A | Discharge status: Home / Self Care | Payer: Medicaid Other | Source: Ambulatory Visit | Attending: Certified Nurse Midwife | Admitting: Certified Nurse Midwife

## 2019-06-01 ENCOUNTER — Other Ambulatory Visit: Payer: Self-pay

## 2019-06-01 ENCOUNTER — Encounter: Payer: Self-pay | Admitting: Certified Nurse Midwife

## 2019-06-01 VITALS — BP 140/83 | HR 88 | Wt 261.8 lb

## 2019-06-01 DIAGNOSIS — Z113 Encounter for screening for infections with a predominantly sexual mode of transmission: Secondary | ICD-10-CM | POA: Insufficient documentation

## 2019-06-01 DIAGNOSIS — R102 Pelvic and perineal pain: Secondary | ICD-10-CM | POA: Insufficient documentation

## 2019-06-01 DIAGNOSIS — Z01419 Encounter for gynecological examination (general) (routine) without abnormal findings: Secondary | ICD-10-CM | POA: Diagnosis not present

## 2019-06-01 DIAGNOSIS — A749 Chlamydial infection, unspecified: Secondary | ICD-10-CM | POA: Diagnosis not present

## 2019-06-01 DIAGNOSIS — B373 Candidiasis of vulva and vagina: Secondary | ICD-10-CM | POA: Diagnosis not present

## 2019-06-01 DIAGNOSIS — B3731 Acute candidiasis of vulva and vagina: Secondary | ICD-10-CM

## 2019-06-01 NOTE — Progress Notes (Signed)
Pt is in the office for reporting AUB, currently has nexplanon, placed 12-16-18. Pt reports bleeding with right sided pelvic pain that radiates to hip, thigh and back, started 2 weeks ago. Bleeding goes back and forth from light to heavy Pt reports vaginal irritation and burning when urinating.

## 2019-06-01 NOTE — Progress Notes (Signed)
GYNECOLOGY ANNUAL PREVENTATIVE CARE ENCOUNTER NOTE  History:     Candace Richardson is a 29 y.o. G34P1102 female here for a routine annual gynecologic exam.  Current complaints: abnormal vaginal bleeding and pelvic pain.   Denies abnormal discharge, problems with intercourse or other gynecologic concerns.    Patient reports abdominal pain and vaginal bleeding started 2 weeks ago. She describes abdominal pain as sharp intermittent cramping in RLQ that radiates to lower back and down leg. Reports vaginal bleeding has since stopped but pelvic pain continues. Reports not having vaginal bleeding since November (had nexplanon placed in October) until 2 weeks ago - was previously on Nexplanon and never had cycle.    Gynecologic History No LMP recorded. Patient has had an implant. Contraception: Nexplanon Last Pap: patient reports never having pap  Obstetric History OB History  Gravida Para Term Preterm AB Living  2 2 1 1   2   SAB TAB Ectopic Multiple Live Births        0 2    # Outcome Date GA Lbr Len/2nd Weight Sex Delivery Anes PTL Lv  2 Term 09/16/15 [redacted]w[redacted]d 11:25 / 00:09 6 lb 11.4 oz (3.045 kg) M Vag-Spont None  LIV     Birth Comments: WNL   1 Preterm 10/15/12 [redacted]w[redacted]d 07:45 / 00:10 3 lb (1.361 kg) F Vag-Spont None  LIV    Past Medical History:  Diagnosis Date  . Bipolar 1 disorder ([place])   . Depression   . Gonorrhea   . Polycystic ovary disease   . PTSD (post-traumatic stress disorder)   . Trichomonas infection     Past Surgical History:  Procedure Laterality Date  . EAR EXAMINATION UNDER ANESTHESIA    . WISDOM TOOTH EXTRACTION      Current Outpatient Medications on File Prior to Visit  Medication Sig Dispense Refill  . ibuprofen (ADVIL,MOTRIN) 800 MG tablet Take 1 tablet (800 mg total) by mouth every 8 (eight) hours as needed. (Patient not taking: Reported on 12/16/2018) 30 tablet 5  . methocarbamol (ROBAXIN) 500 MG tablet Take 1 tablet (500 mg total) by mouth 2 (two) times  daily. (Patient not taking: Reported on 05/05/2018) 20 tablet 0  . metroNIDAZOLE (FLAGYL) 500 MG tablet Take 1 tablet (500 mg total) by mouth 2 (two) times daily. (Patient not taking: Reported on 06/01/2019) 14 tablet 0  . naproxen (NAPROSYN) 500 MG tablet Take 1 tablet (500 mg total) by mouth 2 (two) times daily with a meal. (Patient not taking: Reported on 06/01/2019) 20 tablet 0   No current facility-administered medications on file prior to visit.    Allergies  Allergen Reactions  . Penicillins Hives and Other (See Comments)    Rocephin w/out reaction. Has patient had a PCN reaction causing immediate rash, facial/tongue/throat swelling, SOB or lightheadedness with hypotension: yes Has patient had a PCN reaction causing severe rash involving mucus membranes or skin necrosis: No Has patient had a PCN reaction that required hospitalization No Has patient had a PCN reaction occurring within the last 10 years: No If all of the above answers are "NO", then may proceed with Cephalosporin use.     Social History:  reports that she has been smoking cigarettes. She has never used smokeless tobacco. She reports that she does not drink alcohol or use drugs.  Family History  Problem Relation Age of Onset  . Heart disease Maternal Uncle   . Diabetes Maternal Grandmother     The following portions of the patient's  history were reviewed and updated as appropriate: allergies, current medications, past family history, past medical history, past social history, past surgical history and problem list.  Review of Systems Pertinent items noted in HPI and remainder of comprehensive ROS otherwise negative.  Physical Exam:  BP 140/83   Pulse 88   Wt 261 lb 12.8 oz (118.8 kg)   BMI 36.51 kg/m  CONSTITUTIONAL: Well-developed, well-nourished female in no acute distress.  HENT:  Normocephalic, atraumatic, External right and left ear normal. Oropharynx is clear and moist EYES: Conjunctivae and EOM are  normal. Pupils are equal, round, and reactive to light.  NECK: Normal range of motion, supple, no masses.  Normal thyroid.  SKIN: Skin is warm and dry. No rash noted. Not diaphoretic. No erythema. No pallor. MUSCULOSKELETAL: Normal range of motion. No tenderness.  No cyanosis, clubbing, or edema.  2+ distal pulses. NEUROLOGIC: Alert and oriented to person, place, and time. Normal reflexes, muscle tone coordination.  PSYCHIATRIC: Normal mood and affect. Normal behavior. Normal judgment and thought content. CARDIOVASCULAR: Normal heart rate noted, regular rhythm RESPIRATORY: Clear to auscultation bilaterally. Effort and breath sounds normal, no problems with respiration noted. BREASTS: Symmetric in size. No masses, tenderness, skin changes, nipple drainage, or lymphadenopathy bilaterally. Performed in the presence of a chaperone. ABDOMEN: Soft, no distention noted.  No tenderness, rebound or guarding.  PELVIC: Normal appearing external genitalia and urethral meatus; normal appearing vaginal mucosa and cervix.  Moderate amount of whitish gray thin discharge without odor.  Pap smear obtained.  Normal uterine size, no other palpable masses, no uterine or left adnexal tenderness. Right adnexal tenderness with palpation, no palpable masses. Performed in the presence of a chaperone.   Assessment and Plan:    1. Encounter for annual routine gynecological examination - abnormal well woman examination d/t pelvic pain  - educated and discussed side effect of irregular bleeding with nexplanon, discussed options of watchful waiting, continue with nexplanon and expect irregular spotting or change of method  - patient wants to see what ultrasound states before changing birth control  - Cytology - PAP  2. Pelvic pain in female - Possible cyst vs PID  - Cervicovaginal ancillary only( [place]) - Urine Culture - US Pelvis Complete; Future  3. Screening for STD (sexually transmitted disease) - Patient  request blood work for complete screening of STDs - will test for GC/C and Trich d/t irregular vaginal bleeding (new onset 2 weeks ago) and vaginal discharge on pelvic examination  - Cervicovaginal ancillary only( [place]) - HIV Antibody (routine testing w rflx) - RPR - Hepatitis C Antibody   Will follow up results of pap smear/STD results and manage accordingly. Routine preventative health maintenance measures emphasized. Please refer to After Visit Summary for other counseling recommendations.      Sharyon Cable, CNM [facility] [employer], [facility]

## 2019-06-02 LAB — CERVICOVAGINAL ANCILLARY ONLY
Bacterial Vaginitis (gardnerella): POSITIVE — AB
Candida Glabrata: NEGATIVE
Candida Vaginitis: POSITIVE — AB
Chlamydia: POSITIVE — AB
Comment: NEGATIVE
Comment: NEGATIVE
Comment: NEGATIVE
Comment: NEGATIVE
Comment: NEGATIVE
Comment: NORMAL
Neisseria Gonorrhea: POSITIVE — AB
Trichomonas: NEGATIVE

## 2019-06-02 LAB — RPR: RPR Ser Ql: NONREACTIVE

## 2019-06-02 LAB — HIV ANTIBODY (ROUTINE TESTING W REFLEX): HIV Screen 4th Generation wRfx: NONREACTIVE

## 2019-06-02 LAB — HEPATITIS C ANTIBODY: Hep C Virus Ab: <0.1 s/co ratio (ref 0.0–0.9)

## 2019-06-02 MED ORDER — FLUCONAZOLE 150 MG PO TABS: 150.0000 mg | ORAL_TABLET | Freq: Once | ORAL | 0 refills | Status: AC

## 2019-06-02 MED ORDER — DOXYCYCLINE HYCLATE 100 MG PO TABS: 100.0000 mg | ORAL_TABLET | Freq: Two times a day (BID) | ORAL | 0 refills | Status: DC

## 2019-06-02 NOTE — Addendum Note (Signed)
Addended by: Sharyon Cable on: 06/02/2019 04:56 PM   Modules accepted: Orders

## 2019-06-03 ENCOUNTER — Ambulatory Visit (INDEPENDENT_AMBULATORY_CARE_PROVIDER_SITE_OTHER): Payer: Medicaid Other | Admitting: *Deleted

## 2019-06-03 ENCOUNTER — Other Ambulatory Visit: Payer: Self-pay

## 2019-06-03 DIAGNOSIS — A549 Gonococcal infection, unspecified: Secondary | ICD-10-CM | POA: Diagnosis not present

## 2019-06-03 LAB — CYTOLOGY - PAP: Diagnosis: NEGATIVE

## 2019-06-03 LAB — URINE CULTURE

## 2019-06-03 MED ORDER — CEFTRIAXONE SODIUM 500 MG IJ SOLR
500.0000 mg | Freq: Once | INTRAMUSCULAR | Status: AC
  Administered 2019-06-03: 17:00:00 500 mg via INTRAMUSCULAR

## 2019-06-03 NOTE — Progress Notes (Addendum)
Pt is in office for Rocephin injection for +GC. Pt noted to have Penicillin allergy but has been given this injection several times in the past.  Pt advised that if any sign of reaction that she may take Benadryl. Rocephin given, pt tolerated well.  Pt states she has been able to get other Rx's at pharmacy.   Pt advised to follow up for repeat testing.  Pt has no other questions/concerns.

## 2019-06-08 ENCOUNTER — Ambulatory Visit (HOSPITAL_COMMUNITY): Payer: Medicaid Other

## 2019-06-16 ENCOUNTER — Ambulatory Visit (HOSPITAL_COMMUNITY): Admission: RE | Admit: 2019-06-16 | Payer: Medicaid Other | Source: Ambulatory Visit

## 2019-06-16 ENCOUNTER — Encounter (HOSPITAL_COMMUNITY): Payer: Self-pay

## 2019-06-22 DIAGNOSIS — H5213 Myopia, bilateral: Secondary | ICD-10-CM | POA: Diagnosis not present

## 2019-06-22 DIAGNOSIS — H52223 Regular astigmatism, bilateral: Secondary | ICD-10-CM | POA: Diagnosis not present

## 2019-07-06 DIAGNOSIS — H5213 Myopia, bilateral: Secondary | ICD-10-CM | POA: Diagnosis not present

## 2019-07-29 DIAGNOSIS — H5213 Myopia, bilateral: Secondary | ICD-10-CM | POA: Diagnosis not present

## 2019-08-25 ENCOUNTER — Ambulatory Visit: Payer: Medicaid Other

## 2019-10-08 ENCOUNTER — Ambulatory Visit (INDEPENDENT_AMBULATORY_CARE_PROVIDER_SITE_OTHER): Payer: Medicaid Other | Admitting: Certified Nurse Midwife

## 2019-10-08 ENCOUNTER — Other Ambulatory Visit (HOSPITAL_COMMUNITY)
Admission: RE | Admit: 2019-10-08 | Discharge: 2019-10-08 | Disposition: A | Discharge status: Home / Self Care | Payer: Medicaid Other | Source: Ambulatory Visit | Attending: Certified Nurse Midwife | Admitting: Certified Nurse Midwife

## 2019-10-08 ENCOUNTER — Other Ambulatory Visit: Payer: Self-pay

## 2019-10-08 ENCOUNTER — Encounter: Payer: Self-pay | Admitting: Certified Nurse Midwife

## 2019-10-08 VITALS — BP 127/75 | HR 70 | Wt 249.0 lb

## 2019-10-08 DIAGNOSIS — N730 Acute parametritis and pelvic cellulitis: Secondary | ICD-10-CM | POA: Diagnosis not present

## 2019-10-08 DIAGNOSIS — R300 Dysuria: Secondary | ICD-10-CM

## 2019-10-08 DIAGNOSIS — Z113 Encounter for screening for infections with a predominantly sexual mode of transmission: Secondary | ICD-10-CM | POA: Diagnosis not present

## 2019-10-08 LAB — POCT URINALYSIS DIPSTICK
Bilirubin, UA: NEGATIVE
Blood, UA: NEGATIVE
Glucose, UA: NEGATIVE
Nitrite, UA: NEGATIVE
Protein, UA: POSITIVE — AB
Spec Grav, UA: 1.025 (ref 1.010–1.025)
Urobilinogen, UA: 0.2 E.U./dL
pH, UA: 5 (ref 5.0–8.0)

## 2019-10-08 MED ORDER — DOXYCYCLINE HYCLATE 100 MG PO TABS: 100.0000 mg | ORAL_TABLET | Freq: Two times a day (BID) | ORAL | 0 refills | Status: DC

## 2019-10-08 NOTE — Progress Notes (Addendum)
GYN presents for R breast pain and bump which was resolved. C/o white, malodorous vaginal discharge, dysuria, pelvic pain,  6/10 x 1 week.  Denies fever, chills, NV.

## 2019-10-08 NOTE — Progress Notes (Signed)
History:  [name] is a 29 y.o. [id number] who presents to clinic today for malodorous discharge, dysuria, and pelvic pain. Rates pain 6/10- has not taken any medication for pain. Patient reports that symptoms have been present for the past week.   Patient was treated for Gonorrhea and Chlamydia in March 2021. Patient reports that partner was recently treated and she does not know if they didn't give it enough time. Patient reports these are the same symptoms she had in March when tested positive. Requesting full STD screening today.   The following portions of the patient's history were reviewed and updated as appropriate: allergies, current medications, family history, past medical history, social history, past surgical history and problem list.  Review of Systems:  Review of Systems  Constitutional: Negative.   Respiratory: Negative.   Cardiovascular: Negative.   Gastrointestinal: Negative.   Genitourinary:       Pelvic pain, vaginal discharge and odor  Neurological: Negative.   Psychiatric/Behavioral: Negative.      Objective:  Physical Exam BP 127/75   Pulse 70   Wt (!) 249 lb (112.9 kg)   BMI 34.73 kg/m  Physical Exam Vitals and nursing note reviewed. Exam conducted with a chaperone present.  Constitutional:      Appearance: She is obese.  Cardiovascular:     Rate and Rhythm: Normal rate and regular rhythm.  Pulmonary:     Effort: Pulmonary effort is normal. No respiratory distress.     Breath sounds: Normal breath sounds. No wheezing.  Abdominal:     Palpations: Abdomen is soft.  Genitourinary:    Vagina: Vaginal discharge present.     Comments: Pelvic exam: Cervix pink, visually closed, without lesion, large amount of white creamy discharge, vaginal walls and external genitalia normal Bimanual exam: Cervix 0/long/high, firm, anterior, neg CMT, uterus tender and left adnexa tenderness with palpation Neurological:     Mental Status: She is alert and oriented to  person, place, and time.  Psychiatric:        Mood and Affect: Mood normal.        Behavior: Behavior normal.        Thought Content: Thought content normal.     Labs and Imaging Results for orders placed or performed in visit on 10/08/19 (from the past 24 hour(s))  POCT Urinalysis Dipstick     Status: Abnormal   Collection Time: 10/08/19 11:30 AM  Result Value Ref Range   Color, UA GOLD    Clarity, UA     Glucose, UA Negative Negative   Bilirubin, UA NEGATIVE    Ketones, UA SMALL 15    Spec Grav, UA 1.025 1.010 - 1.025   Blood, UA NEGATIVE    pH, UA 5.0 5.0 - 8.0   Protein, UA Positive (A) Negative   Urobilinogen, UA 0.2 0.2 or 1.0 E.U./dL   Nitrite, UA NEGATIVE    Leukocytes, UA Small (1+) (A) Negative   Appearance     Odor      Assessment & Plan:  1. Screening for STDs (sexually transmitted diseases) - Patient requesting STD screening and will manage according to results  - Cervicovaginal ancillary only( [facility]) - HIV Antibody (routine testing w rflx) - RPR  2. Acute pelvic inflammatory disease (PID) - Will treat for PID based on clinical symptoms and physical examination  - doxycycline (VIBRA-TABS) 100 MG tablet; Take 1 tablet (100 mg total) by mouth 2 (two) times daily.  Dispense: 28 tablet; Refill: 0  3. Dysuria - Dipstick negative, urine culture pending  - POCT Urinalysis Dipstick - Urine Culture   Sharyon Cable, CNM 10/08/2019 4:24 PM

## 2019-10-08 NOTE — Patient Instructions (Signed)

## 2019-10-09 LAB — RPR: RPR Ser Ql: NONREACTIVE

## 2019-10-09 LAB — HIV ANTIBODY (ROUTINE TESTING W REFLEX): HIV Screen 4th Generation wRfx: NONREACTIVE

## 2019-10-10 LAB — URINE CULTURE

## 2019-10-12 LAB — CERVICOVAGINAL ANCILLARY ONLY
Bacterial Vaginitis (gardnerella): POSITIVE — AB
Candida Glabrata: NEGATIVE
Candida Vaginitis: NEGATIVE
Chlamydia: NEGATIVE
Comment: NEGATIVE
Comment: NEGATIVE
Comment: NEGATIVE
Comment: NEGATIVE
Comment: NEGATIVE
Comment: NORMAL
Neisseria Gonorrhea: POSITIVE — AB
Trichomonas: NEGATIVE

## 2019-10-13 ENCOUNTER — Ambulatory Visit (INDEPENDENT_AMBULATORY_CARE_PROVIDER_SITE_OTHER): Payer: Medicaid Other

## 2019-10-13 ENCOUNTER — Other Ambulatory Visit: Payer: Self-pay

## 2019-10-13 VITALS — BP 112/70 | HR 76 | Ht 72.0 in | Wt 251.0 lb

## 2019-10-13 DIAGNOSIS — A549 Gonococcal infection, unspecified: Secondary | ICD-10-CM

## 2019-10-13 DIAGNOSIS — A5402 Gonococcal vulvovaginitis, unspecified: Secondary | ICD-10-CM

## 2019-10-13 MED ORDER — CEFTRIAXONE SODIUM 500 MG IJ SOLR
500.0000 mg | Freq: Once | INTRAMUSCULAR | Status: AC
  Administered 2019-10-13: 500 mg via INTRAMUSCULAR

## 2019-10-13 NOTE — Progress Notes (Signed)
Patient was assessed and managed by nursing staff during this encounter. I have reviewed the chart and agree with the documentation and plan. I have also made any necessary editorial changes.  Venola Castello A Deidre Carino, MD 10/13/2019 3:27 PM   

## 2019-10-13 NOTE — Progress Notes (Addendum)
SUBJECTIVE GYN presents for Rocephin Injection for +Gc, 500 mg given in RUOQ, tolerated well.  PLAN Patient advised to ensure that her partner is treated, to abstain from sexual intercourse and to return for TOC in 4-6 weeks. Patient verbalized understanding and agreement.  Administrations This Visit    cefTRIAXone (ROCEPHIN) injection 500 mg    Admin Date 10/13/2019 Action Given Dose 500 mg Route Intramuscular Administered By Maretta Bees, RMA

## 2019-10-22 ENCOUNTER — Ambulatory Visit (HOSPITAL_COMMUNITY)
Admission: EM | Admit: 2019-10-22 | Discharge: 2019-10-22 | Disposition: A | Discharge status: Home / Self Care | Payer: Medicaid Other | Attending: Psychiatry | Admitting: Psychiatry

## 2019-10-22 ENCOUNTER — Other Ambulatory Visit: Payer: Self-pay

## 2019-10-22 ENCOUNTER — Ambulatory Visit (INDEPENDENT_AMBULATORY_CARE_PROVIDER_SITE_OTHER): Payer: Medicaid Other | Admitting: Licensed Clinical Social Worker

## 2019-10-22 DIAGNOSIS — F32A Depression, unspecified: Secondary | ICD-10-CM

## 2019-10-22 DIAGNOSIS — F332 Major depressive disorder, recurrent severe without psychotic features: Secondary | ICD-10-CM | POA: Diagnosis not present

## 2019-10-22 DIAGNOSIS — F329 Major depressive disorder, single episode, unspecified: Secondary | ICD-10-CM | POA: Insufficient documentation
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
## 2019-10-22 NOTE — Discharge Instructions (Addendum)

## 2019-10-22 NOTE — ED Notes (Signed)
AVS/Follow-UP appointments reviewed with patient and written copy given to patient and she verbalized understanding. Patient escorted to lobby to meet ride. Patient aware of appointment today in outpatient department.
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
## 2019-11-04 ENCOUNTER — Other Ambulatory Visit: Payer: Self-pay

## 2019-11-04 ENCOUNTER — Ambulatory Visit: Payer: Medicaid Other | Admitting: Women's Health

## 2019-11-04 ENCOUNTER — Encounter: Payer: Self-pay | Admitting: Women's Health

## 2019-11-04 VITALS — BP 124/69 | HR 69 | Wt 246.0 lb

## 2019-11-04 DIAGNOSIS — Z3046 Encounter for surveillance of implantable subdermal contraceptive: Secondary | ICD-10-CM | POA: Diagnosis not present

## 2019-11-04 NOTE — Patient Instructions (Signed)
You have had the Nexplanon removed today. Your return to fertility is immediate and you could become pregnant at any time. It may take a few months for your periods to return to normal. Keep the outer bandage on and keep it clean and dry for the next 24 hours. Tomorrow morning, you can remove the bandage and shower as usual. The stickers on the skin will fall off on their own over the next 1-2 weeks. Do not peel them off.  Do not soak your arm (no bath tubs, hot tubs, swimming pools, etc.) until the incision has completely healed, usually within about 1-2 weeks. If you notice any signs of infection (increased pain, redness, warmth, drainage, fever above 100.4 degrees), call back to the office immediately.

## 2019-11-04 NOTE — Progress Notes (Signed)
Ms. MYEESHA SHANE is a 29 y.o. [id number] here for Nexplanon removal with no c/o. Nexplanon was inserted approxiamtely 4 years ago at [facility]. Pt is sure of decision to remove Nexplanon. Informed consent document signed. Pt has allergy to PCN only. Nexplanon easily palpated on inner aspect of left upper arm above biiceps muscle. Betadine applied to the area x2.  Lidocaine 1%, 74mL inserted under the distal end of Nexplanon at marked site. Sterile gloves donned. Confirmed pt was anesthetized with sharp test of scalpel. With the proximal end pressed down, a small, longitudinal <37mm incision was made with a #11 scalpel, starting at the distal tip of the implant. Once the tip was visualized, the Nexplanon was grasped with forceps and removed gently, while maintaining traction on the skin above the proximal end.  Nexplanon removed without difficulty, intact, and shown to patient. Confirmed entire 4cm length was removed by measuring. Incision was then closed with a steri-strip, followed by an adhesive bandage and then pressure bandage with sterile gauze. Pt instructed to leave pressure dressing on for 24hrs and steri-strip on for 3-5days. Pt instructed to wash area with soap and water by hand for the next 5 days. Pt requesting removal today d/t attempting to conceive. Pt advised to start PNVs at this time and advised of time of return to fertility.   Marylen Ponto, NP  2:08 PM 11/04/2019

## 2019-11-04 NOTE — Progress Notes (Signed)
RGYN patient presents for Nexplanon Removal .   Nexplanon Inserted: 12/16/2018 Left Arm.  Pt states she does not to do any contraception at this time.  Pt states she does not like the Nexplanon.

## 2019-11-10 ENCOUNTER — Ambulatory Visit: Payer: Medicaid Other

## 2019-11-18 ENCOUNTER — Other Ambulatory Visit: Payer: Self-pay

## 2019-11-18 ENCOUNTER — Other Ambulatory Visit (HOSPITAL_COMMUNITY)
Admission: RE | Admit: 2019-11-18 | Discharge: 2019-11-18 | Disposition: A | Discharge status: Home / Self Care | Payer: Medicaid Other | Source: Ambulatory Visit | Attending: Obstetrics | Admitting: Obstetrics

## 2019-11-18 ENCOUNTER — Ambulatory Visit (INDEPENDENT_AMBULATORY_CARE_PROVIDER_SITE_OTHER): Payer: Medicaid Other

## 2019-11-18 VITALS — BP 121/74 | HR 71 | Wt 251.0 lb

## 2019-11-18 DIAGNOSIS — A549 Gonococcal infection, unspecified: Secondary | ICD-10-CM | POA: Diagnosis not present

## 2019-11-18 DIAGNOSIS — Z113 Encounter for screening for infections with a predominantly sexual mode of transmission: Secondary | ICD-10-CM

## 2019-11-18 NOTE — Progress Notes (Signed)
SUBJECTIVE GYN presents for TOC for +Gonorrhea and BV.  Self swab done by patient  PLAN Self swab GC and BV probe sent to Lab.  Orders per results.

## 2019-11-19 LAB — CERVICOVAGINAL ANCILLARY ONLY
Bacterial Vaginitis (gardnerella): POSITIVE — AB
Chlamydia: NEGATIVE
Comment: NEGATIVE
Comment: NEGATIVE
Comment: NORMAL
Neisseria Gonorrhea: NEGATIVE

## 2019-11-20 ENCOUNTER — Other Ambulatory Visit: Payer: Self-pay | Admitting: Obstetrics

## 2019-11-20 DIAGNOSIS — A749 Chlamydial infection, unspecified: Secondary | ICD-10-CM

## 2019-11-20 MED ORDER — METRONIDAZOLE 500 MG PO TABS: 500.0000 mg | ORAL_TABLET | Freq: Two times a day (BID) | ORAL | 0 refills | Status: DC

## 2019-11-22 ENCOUNTER — Telehealth: Payer: Self-pay

## 2019-11-22 DIAGNOSIS — R109 Unspecified abdominal pain: Secondary | ICD-10-CM | POA: Diagnosis not present

## 2019-11-22 NOTE — Telephone Encounter (Signed)
-----   Message from Brock Bad, MD sent at 11/20/2019  2:56 PM EDT ----- Flagyl Rx for BV

## 2019-11-22 NOTE — Telephone Encounter (Signed)
Call patient to inform her of test results and the need to start flagyl to help clear up infection. Patient voice understanding.

## 2019-12-21 ENCOUNTER — Ambulatory Visit (HOSPITAL_COMMUNITY): Payer: Medicaid Other | Admitting: Licensed Clinical Social Worker

## 2020-01-04 ENCOUNTER — Ambulatory Visit (HOSPITAL_COMMUNITY): Payer: Self-pay | Admitting: Licensed Clinical Social Worker

## 2020-01-18 ENCOUNTER — Ambulatory Visit (HOSPITAL_COMMUNITY): Payer: Self-pay | Admitting: Licensed Clinical Social Worker

## 2020-02-01 ENCOUNTER — Ambulatory Visit (HOSPITAL_COMMUNITY): Payer: Self-pay | Admitting: Licensed Clinical Social Worker

## 2020-04-18 DIAGNOSIS — H9012 Conductive hearing loss, unilateral, left ear, with unrestricted hearing on the contralateral side: Secondary | ICD-10-CM | POA: Diagnosis not present

## 2020-04-18 DIAGNOSIS — H6692 Otitis media, unspecified, left ear: Secondary | ICD-10-CM | POA: Diagnosis not present

## 2020-04-18 DIAGNOSIS — H6983 Other specified disorders of Eustachian tube, bilateral: Secondary | ICD-10-CM | POA: Diagnosis not present

## 2020-05-09 DIAGNOSIS — Z419 Encounter for procedure for purposes other than remedying health state, unspecified: Secondary | ICD-10-CM | POA: Diagnosis not present

## 2020-05-11 DIAGNOSIS — H9012 Conductive hearing loss, unilateral, left ear, with unrestricted hearing on the contralateral side: Secondary | ICD-10-CM | POA: Diagnosis not present

## 2020-05-11 DIAGNOSIS — H6983 Other specified disorders of Eustachian tube, bilateral: Secondary | ICD-10-CM | POA: Diagnosis not present

## 2020-05-16 ENCOUNTER — Ambulatory Visit: Payer: Medicaid Other | Admitting: Obstetrics and Gynecology

## 2020-06-09 DIAGNOSIS — Z419 Encounter for procedure for purposes other than remedying health state, unspecified: Secondary | ICD-10-CM | POA: Diagnosis not present

## 2020-07-07 DIAGNOSIS — Z9622 Myringotomy tube(s) status: Secondary | ICD-10-CM | POA: Diagnosis not present

## 2020-07-07 DIAGNOSIS — H6983 Other specified disorders of Eustachian tube, bilateral: Secondary | ICD-10-CM | POA: Diagnosis not present

## 2020-07-07 DIAGNOSIS — H9012 Conductive hearing loss, unilateral, left ear, with unrestricted hearing on the contralateral side: Secondary | ICD-10-CM | POA: Diagnosis not present

## 2020-07-09 DIAGNOSIS — Z419 Encounter for procedure for purposes other than remedying health state, unspecified: Secondary | ICD-10-CM | POA: Diagnosis not present

## 2020-08-09 DIAGNOSIS — Z419 Encounter for procedure for purposes other than remedying health state, unspecified: Secondary | ICD-10-CM | POA: Diagnosis not present

## 2020-08-17 ENCOUNTER — Ambulatory Visit: Payer: Medicaid Other

## 2020-09-08 DIAGNOSIS — Z419 Encounter for procedure for purposes other than remedying health state, unspecified: Secondary | ICD-10-CM | POA: Diagnosis not present

## 2020-10-09 DIAGNOSIS — Z419 Encounter for procedure for purposes other than remedying health state, unspecified: Secondary | ICD-10-CM | POA: Diagnosis not present

## 2020-11-09 DIAGNOSIS — Z419 Encounter for procedure for purposes other than remedying health state, unspecified: Secondary | ICD-10-CM | POA: Diagnosis not present

## 2020-12-09 DIAGNOSIS — Z419 Encounter for procedure for purposes other than remedying health state, unspecified: Secondary | ICD-10-CM | POA: Diagnosis not present

## 2021-01-09 DIAGNOSIS — Z419 Encounter for procedure for purposes other than remedying health state, unspecified: Secondary | ICD-10-CM | POA: Diagnosis not present

## 2021-02-07 ENCOUNTER — Ambulatory Visit: Payer: Medicaid Other | Admitting: Obstetrics

## 2021-02-08 DIAGNOSIS — Z419 Encounter for procedure for purposes other than remedying health state, unspecified: Secondary | ICD-10-CM | POA: Diagnosis not present

## 2021-03-11 DIAGNOSIS — Z419 Encounter for procedure for purposes other than remedying health state, unspecified: Secondary | ICD-10-CM | POA: Diagnosis not present

## 2021-03-20 DIAGNOSIS — Z9622 Myringotomy tube(s) status: Secondary | ICD-10-CM | POA: Diagnosis not present

## 2021-03-20 DIAGNOSIS — H9012 Conductive hearing loss, unilateral, left ear, with unrestricted hearing on the contralateral side: Secondary | ICD-10-CM | POA: Diagnosis not present

## 2021-03-20 DIAGNOSIS — H6983 Other specified disorders of Eustachian tube, bilateral: Secondary | ICD-10-CM | POA: Diagnosis not present

## 2021-03-27 DIAGNOSIS — H9012 Conductive hearing loss, unilateral, left ear, with unrestricted hearing on the contralateral side: Secondary | ICD-10-CM | POA: Diagnosis not present

## 2021-03-28 ENCOUNTER — Encounter: Payer: Medicaid Other | Admitting: Obstetrics and Gynecology

## 2021-04-03 ENCOUNTER — Ambulatory Visit: Payer: Medicaid Other | Admitting: Obstetrics

## 2021-04-10 ENCOUNTER — Encounter: Payer: Self-pay | Admitting: Obstetrics

## 2021-04-10 ENCOUNTER — Other Ambulatory Visit: Payer: Self-pay

## 2021-04-10 ENCOUNTER — Ambulatory Visit (INDEPENDENT_AMBULATORY_CARE_PROVIDER_SITE_OTHER): Payer: Medicaid Other | Admitting: Obstetrics

## 2021-04-10 ENCOUNTER — Other Ambulatory Visit (HOSPITAL_COMMUNITY)
Admission: RE | Admit: 2021-04-10 | Discharge: 2021-04-10 | Disposition: A | Discharge status: Home / Self Care | Payer: Medicaid Other | Source: Ambulatory Visit | Attending: Obstetrics | Admitting: Obstetrics

## 2021-04-10 VITALS — BP 136/76 | HR 59 | Ht 72.0 in | Wt 274.7 lb

## 2021-04-10 DIAGNOSIS — E669 Obesity, unspecified: Secondary | ICD-10-CM | POA: Diagnosis not present

## 2021-04-10 DIAGNOSIS — Z01419 Encounter for gynecological examination (general) (routine) without abnormal findings: Secondary | ICD-10-CM | POA: Diagnosis not present

## 2021-04-10 DIAGNOSIS — F17210 Nicotine dependence, cigarettes, uncomplicated: Secondary | ICD-10-CM | POA: Diagnosis not present

## 2021-04-10 DIAGNOSIS — Z113 Encounter for screening for infections with a predominantly sexual mode of transmission: Secondary | ICD-10-CM

## 2021-04-10 DIAGNOSIS — N898 Other specified noninflammatory disorders of vagina: Secondary | ICD-10-CM

## 2021-04-10 MED ORDER — METRONIDAZOLE 500 MG PO TABS: 500.0000 mg | ORAL_TABLET | Freq: Two times a day (BID) | ORAL | 2 refills | Status: DC

## 2021-04-10 MED ORDER — FLUCONAZOLE 200 MG PO TABS: 200.0000 mg | ORAL_TABLET | ORAL | 2 refills | Status: DC

## 2021-04-10 NOTE — Progress Notes (Signed)
Pt is in the office for STD testing Reports vaginal itching and odor Last pap: 06-01-19

## 2021-04-10 NOTE — Progress Notes (Signed)
Subjective:        Candace Richardson is a 31 y.o. female here for a routine exam.  Current complaints: Vaginal discharge, itching and odor..    Personal health questionnaire:  Is patient Ashkenazi Jewish, have a family history of breast and/or ovarian cancer: no Is there a family history of uterine cancer diagnosed at age < 7450, gastrointestinal cancer, urinary tract cancer, family member who is a Personnel officerLynch syndrome-associated carrier: no Is the patient overweight and hypertensive, family history of diabetes, personal history of gestational diabetes, preeclampsia or PCOS: no Is patient over 6655, have PCOS,  family history of premature CHD under age 965, diabetes, smoke, have hypertension or peripheral artery disease:  no At any time, has a partner hit, kicked or otherwise hurt or frightened you?: no Over the past 2 weeks, have you felt down, depressed or hopeless?: no Over the past 2 weeks, have you felt little interest or pleasure in doing things?:no   Gynecologic History Patient's last menstrual period was 03/29/2021. Contraception: none Last Pap: 06-01-2019. Results were: normal Last mammogram: n/a. Results were: n/a  Obstetric History OB History  Gravida Para Term Preterm AB Living  2 2 1 1   2   SAB IAB Ectopic Multiple Live Births        0 2    # Outcome Date GA Lbr Len/2nd Weight Sex Delivery Anes PTL Lv  2 Term 09/16/15 6045w1d 11:25 / 00:09 6 lb 11.4 oz (3.045 kg) M Vag-Spont None  LIV     Birth Comments: WNL   1 Preterm 10/15/12 6973w0d 07:45 / 00:10 3 lb (1.361 kg) F Vag-Spont None  LIV    Past Medical History:  Diagnosis Date   Bipolar 1 disorder (HCC)    Depression    Gonorrhea    Polycystic ovary disease    PTSD (post-traumatic stress disorder)    Trichomonas infection     Past Surgical History:  Procedure Laterality Date   EAR EXAMINATION UNDER ANESTHESIA     WISDOM TOOTH EXTRACTION       Current Outpatient Medications:    fluconazole (DIFLUCAN) 200 MG tablet,  Take 1 tablet (200 mg total) by mouth every 3 (three) days., Disp: 3 tablet, Rfl: 2   metroNIDAZOLE (FLAGYL) 500 MG tablet, Take 1 tablet (500 mg total) by mouth 2 (two) times daily., Disp: 14 tablet, Rfl: 2   ibuprofen (ADVIL,MOTRIN) 800 MG tablet, Take 1 tablet (800 mg total) by mouth every 8 (eight) hours as needed. (Patient not taking: Reported on 12/16/2018), Disp: 30 tablet, Rfl: 5   methocarbamol (ROBAXIN) 500 MG tablet, Take 1 tablet (500 mg total) by mouth 2 (two) times daily. (Patient not taking: Reported on 05/05/2018), Disp: 20 tablet, Rfl: 0   metroNIDAZOLE (FLAGYL) 500 MG tablet, Take 1 tablet (500 mg total) by mouth 2 (two) times daily. (Patient not taking: Reported on 04/10/2021), Disp: 14 tablet, Rfl: 0   naproxen (NAPROSYN) 500 MG tablet, Take 1 tablet (500 mg total) by mouth 2 (two) times daily with a meal. (Patient not taking: Reported on 11/04/2019), Disp: 20 tablet, Rfl: 0 Allergies  Allergen Reactions   Penicillins Hives and Other (See Comments)    Rocephin w/out reaction. Has patient had a PCN reaction causing immediate rash, facial/tongue/throat swelling, SOB or lightheadedness with hypotension: yes Has patient had a PCN reaction causing severe rash involving mucus membranes or skin necrosis: No Has patient had a PCN reaction that required hospitalization No Has patient had a PCN reaction  occurring within the last 10 years: No If all of the above answers are "NO", then may proceed with Cephalosporin use.     Social History   Tobacco Use   Smoking status: Light Smoker    Types: Cigarettes   Smokeless tobacco: Never  Substance Use Topics   Alcohol use: No    Alcohol/week: 0.0 standard drinks    Family History  Problem Relation Age of Onset   Heart disease Maternal Uncle    Diabetes Maternal Grandmother       Review of Systems  Constitutional: negative for fatigue and weight loss Respiratory: negative for cough and wheezing Cardiovascular: negative for chest  pain, fatigue and palpitations Gastrointestinal: negative for abdominal pain and change in bowel habits Musculoskeletal:negative for myalgias Neurological: negative for gait problems and tremors Behavioral/Psych: negative for abusive relationship, depression Endocrine: negative for temperature intolerance    Genitourinary:negative for abnormal menstrual periods, genital lesions, hot flashes, sexual problems and vaginal discharge Integument/breast: negative for breast lump, breast tenderness, nipple discharge and skin lesion(s)    Objective:       BP 136/76    Pulse (!) 59    Ht 6' (1.829 m)    Wt 274 lb 11.2 oz (124.6 kg)    LMP 03/29/2021    BMI 37.26 kg/m  General:   alert  Skin:   no rash or abnormalities  Lungs:   clear to auscultation bilaterally  Heart:   regular rate and rhythm, S1, S2 normal, no murmur, click, rub or gallop  Breasts:   normal without suspicious masses, skin or nipple changes or axillary nodes  Abdomen:  normal findings: no organomegaly, soft, non-tender and no hernia  Pelvis:  External genitalia: normal general appearance Urinary system: urethral meatus normal and bladder without fullness, nontender Vaginal: normal without tenderness, induration or masses Cervix: normal appearance Adnexa: normal bimanual exam Uterus: anteverted and non-tender, normal size   Lab Review Urine pregnancy test Labs reviewed yes Radiologic studies reviewed no  I have spent a total of 20 minutes of face-to-face time, excluding clinical staff time, reviewing notes and preparing to see patient, ordering tests and/or medications, and counseling the patient.   Assessment:    1. Encounter for gynecological examination with Papanicolaou smear of cervix Rx: - Cytology - PAP( [facility])  2. Vaginal discharge Rx: - Cervicovaginal ancillary only( [facility]) - metroNIDAZOLE (FLAGYL) 500 MG tablet; Take 1 tablet (500 mg total) by mouth 2 (two) times daily.  Dispense: 14 tablet;  Refill: 2 - fluconazole (DIFLUCAN) 200 MG tablet; Take 1 tablet (200 mg total) by mouth every 3 (three) days.  Dispense: 3 tablet; Refill: 2  3. Screening for STD (sexually transmitted disease) Rx: - HIV antibody (with reflex) - RPR - Hepatitis B Surface AntiGEN - Hepatitis C Antibody  4. Obesity (BMI 35.0-39.9 without comorbidity) - weight reduction with the aid of dietary changes, exercise and behavioral modification recommended  5. Tobacco Dependence - smoking cessation recommended     Plan:    Education reviewed: calcium supplements, depression evaluation, low fat, low cholesterol diet, safe sex/STD prevention, self breast exams, smoking cessation, and weight bearing exercise. Contraception: none. Follow up in: 1 year.   Meds ordered this encounter  Medications   metroNIDAZOLE (FLAGYL) 500 MG tablet    Sig: Take 1 tablet (500 mg total) by mouth 2 (two) times daily.    Dispense:  14 tablet    Refill:  2   fluconazole (DIFLUCAN) 200 MG tablet  Sig: Take 1 tablet (200 mg total) by mouth every 3 (three) days.    Dispense:  3 tablet    Refill:  2   Orders Placed This Encounter  Procedures   HIV antibody (with reflex)   RPR   Hepatitis B Surface AntiGEN   Hepatitis C Antibody     Brock Bad, MD 04/10/2021 2:22 PM

## 2021-04-11 DIAGNOSIS — Z419 Encounter for procedure for purposes other than remedying health state, unspecified: Secondary | ICD-10-CM | POA: Diagnosis not present

## 2021-04-11 LAB — CERVICOVAGINAL ANCILLARY ONLY
Bacterial Vaginitis (gardnerella): POSITIVE — AB
Candida Glabrata: NEGATIVE
Candida Vaginitis: NEGATIVE
Chlamydia: NEGATIVE
Comment: NEGATIVE
Comment: NEGATIVE
Comment: NEGATIVE
Comment: NEGATIVE
Comment: NEGATIVE
Comment: NORMAL
Neisseria Gonorrhea: NEGATIVE
Trichomonas: NEGATIVE

## 2021-04-11 LAB — HEPATITIS C ANTIBODY: Hep C Virus Ab: <0.1 s/co ratio (ref 0.0–0.9)

## 2021-04-11 LAB — RPR: RPR Ser Ql: NONREACTIVE

## 2021-04-11 LAB — HIV ANTIBODY (ROUTINE TESTING W REFLEX): HIV Screen 4th Generation wRfx: NONREACTIVE

## 2021-04-11 LAB — HEPATITIS B SURFACE ANTIGEN: Hepatitis B Surface Ag: NEGATIVE

## 2021-04-12 ENCOUNTER — Other Ambulatory Visit: Payer: Self-pay | Admitting: Obstetrics

## 2021-04-13 LAB — CYTOLOGY - PAP: Diagnosis: NEGATIVE

## 2021-05-09 DIAGNOSIS — Z419 Encounter for procedure for purposes other than remedying health state, unspecified: Secondary | ICD-10-CM | POA: Diagnosis not present

## 2021-06-09 DIAGNOSIS — Z419 Encounter for procedure for purposes other than remedying health state, unspecified: Secondary | ICD-10-CM | POA: Diagnosis not present

## 2021-07-09 DIAGNOSIS — Z419 Encounter for procedure for purposes other than remedying health state, unspecified: Secondary | ICD-10-CM | POA: Diagnosis not present

## 2021-07-25 ENCOUNTER — Encounter (HOSPITAL_COMMUNITY): Payer: Self-pay | Admitting: Emergency Medicine

## 2021-07-25 ENCOUNTER — Other Ambulatory Visit: Payer: Self-pay

## 2021-07-25 ENCOUNTER — Emergency Department (HOSPITAL_COMMUNITY)
Admission: EM | Admit: 2021-07-25 | Discharge: 2021-07-26 | Disposition: A | Discharge status: Home / Self Care | Payer: Medicaid Other | Attending: Emergency Medicine | Admitting: Emergency Medicine

## 2021-07-25 DIAGNOSIS — R110 Nausea: Secondary | ICD-10-CM | POA: Insufficient documentation

## 2021-07-25 DIAGNOSIS — R350 Frequency of micturition: Secondary | ICD-10-CM | POA: Insufficient documentation

## 2021-07-25 DIAGNOSIS — D72829 Elevated white blood cell count, unspecified: Secondary | ICD-10-CM | POA: Insufficient documentation

## 2021-07-25 DIAGNOSIS — R1032 Left lower quadrant pain: Secondary | ICD-10-CM | POA: Insufficient documentation

## 2021-07-25 DIAGNOSIS — N9489 Other specified conditions associated with female genital organs and menstrual cycle: Secondary | ICD-10-CM | POA: Insufficient documentation

## 2021-07-25 LAB — I-STAT BETA HCG BLOOD, ED (MC, WL, AP ONLY): I-stat hCG, quantitative: <5 m[IU]/mL (ref <5)

## 2021-07-25 LAB — COMPREHENSIVE METABOLIC PANEL
ALT: 13 U/L (ref 0–44)
AST: 15 U/L (ref 15–41)
Albumin: 3.7 g/dL (ref 3.5–5.0)
Alkaline Phosphatase: 64 U/L (ref 38–126)
Anion gap: 3 — ABNORMAL LOW (ref 5–15)
BUN: 14 mg/dL (ref 6–20)
CO2: 21 mmol/L — ABNORMAL LOW (ref 22–32)
Calcium: 8.8 mg/dL — ABNORMAL LOW (ref 8.9–10.3)
Chloride: 111 mmol/L (ref 98–111)
Creatinine, Ser: 1.02 mg/dL — ABNORMAL HIGH (ref 0.44–1.00)
GFR, Estimated: >60 mL/min (ref >60)
Glucose, Bld: 83 mg/dL (ref 70–99)
Potassium: 3.6 mmol/L (ref 3.5–5.1)
Sodium: 135 mmol/L (ref 135–145)
Total Bilirubin: 0.5 mg/dL (ref 0.3–1.2)
Total Protein: 6.5 g/dL (ref 6.5–8.1)

## 2021-07-25 LAB — CBC
HCT: 44.7 % (ref 36.0–46.0)
Hemoglobin: 14.6 g/dL (ref 12.0–15.0)
MCH: 29.3 pg (ref 26.0–34.0)
MCHC: 32.7 g/dL (ref 30.0–36.0)
MCV: 89.8 fL (ref 80.0–100.0)
Platelets: 188 10*3/uL (ref 150–400)
RBC: 4.98 MIL/uL (ref 3.87–5.11)
RDW: 14.3 % (ref 11.5–15.5)
WBC: 11.6 10*3/uL — ABNORMAL HIGH (ref 4.0–10.5)
nRBC: 0 % (ref 0.0–0.2)

## 2021-07-25 LAB — URINALYSIS, ROUTINE W REFLEX MICROSCOPIC
Bacteria, UA: NONE SEEN
Bilirubin Urine: NEGATIVE
Glucose, UA: NEGATIVE mg/dL
Hgb urine dipstick: NEGATIVE
Ketones, ur: NEGATIVE mg/dL
Leukocytes,Ua: NEGATIVE
Nitrite: NEGATIVE
Protein, ur: NEGATIVE mg/dL
Specific Gravity, Urine: 1.021 (ref 1.005–1.030)
pH: 5 (ref 5.0–8.0)

## 2021-07-25 LAB — LIPASE, BLOOD: Lipase: 33 U/L (ref 11–51)

## 2021-07-25 MED ORDER — ONDANSETRON HCL 4 MG/2ML IJ SOLN
4.0000 mg | Freq: Once | INTRAMUSCULAR | Status: AC
  Administered 2021-07-26: 4 mg via INTRAVENOUS
  Filled 2021-07-25: qty 2

## 2021-07-25 MED ORDER — MORPHINE SULFATE (PF) 4 MG/ML IV SOLN: 4.0000 mg | Freq: Once | INTRAVENOUS | Status: DC

## 2021-07-25 MED ORDER — HYDROMORPHONE HCL 1 MG/ML IJ SOLN
0.5000 mg | Freq: Once | INTRAMUSCULAR | Status: AC
  Administered 2021-07-26: 0.5 mg via INTRAVENOUS
  Filled 2021-07-25: qty 1

## 2021-07-25 NOTE — ED Provider Notes (Signed)
[facility]   CSN: [id number] Arrival date & time: 07/25/21  1931     History  Chief Complaint  Patient presents with   Abdominal Pain    Candace Richardson is a 31 y.o. female  who presents with 2 days of suprapubic and left lower abdominal pain with associated nausea.  She denies history of the same in the past.  Does endorse urinary frequency for the last 2 days but denies any dysuria.  Denies any vaginal bleeding or discharge, LMP 06/29/2021.  Denies any diarrhea but does state that she had to work harder to pass a bowel movement today than is normal despite her stool being soft.  Denies any fevers or chills.  She is not on any medications daily.  Denies melena or hematochezia.  Has history of PCOS.  Accompanied by her father at the bedside.  HPI     Home Medications Prior to Admission medications   Medication Sig Start Date End Date Taking? Authorizing Provider  ondansetron (ZOFRAN-ODT) 4 MG disintegrating tablet Take 1 tablet (4 mg total) by mouth every 8 (eight) hours as needed for nausea or vomiting. 07/26/21  Yes Shereen Marton R, PA-C  fluconazole (DIFLUCAN) 200 MG tablet Take 1 tablet (200 mg total) by mouth every 3 (three) days. 04/10/21   Brock BadHarper, Charles A, MD  ibuprofen (ADVIL,MOTRIN) 800 MG tablet Take 1 tablet (800 mg total) by mouth every 8 (eight) hours as needed. Patient not taking: Reported on 12/16/2018 05/05/18   Brock BadHarper, Charles A, MD  methocarbamol (ROBAXIN) 500 MG tablet Take 1 tablet (500 mg total) by mouth 2 (two) times daily. Patient not taking: Reported on 05/05/2018 04/08/18   Elson AreasSofia, Leslie K, PA-C  metroNIDAZOLE (FLAGYL) 500 MG tablet Take 1 tablet (500 mg total) by mouth 2 (two) times daily. Patient not taking: Reported on 04/10/2021 11/20/19   Brock BadHarper, Charles A, MD  metroNIDAZOLE (FLAGYL) 500 MG tablet Take 1 tablet (500 mg total) by mouth 2 (two) times daily. 04/10/21   Brock BadHarper, Charles A, MD  naproxen  (NAPROSYN) 500 MG tablet Take 1 tablet (500 mg total) by mouth 2 (two) times daily with a meal. Patient not taking: Reported on 11/04/2019 04/02/19   Caccavale, Sophia, PA-C      Allergies    Penicillins    Review of Systems   Review of Systems  Constitutional:  Positive for appetite change. Negative for activity change, chills, diaphoresis, fatigue and fever.  HENT: Negative.    Respiratory: Negative.    Cardiovascular: Negative.   Gastrointestinal:  Positive for abdominal pain and nausea. Negative for constipation, diarrhea and vomiting.  Genitourinary:  Positive for frequency. Negative for decreased urine volume, dysuria, flank pain, hematuria, menstrual problem, pelvic pain, urgency, vaginal bleeding, vaginal discharge and vaginal pain.   Physical Exam Updated Vital Signs BP (!) 113/52   Pulse (!) 52   Temp 98.6 F (37 C) (Oral)   Resp 20   LMP 06/29/2021   SpO2 96%  Physical Exam Vitals and nursing note reviewed.  Constitutional:      Appearance: She is obese. She is not ill-appearing or toxic-appearing.  HENT:     Head: Normocephalic and atraumatic.     Mouth/Throat:     Mouth: Mucous membranes are moist.     Pharynx: No oropharyngeal exudate or posterior oropharyngeal erythema.  Eyes:     General:        Right eye: No discharge.  Left eye: No discharge.     Conjunctiva/sclera: Conjunctivae normal.     Pupils: Pupils are equal, round, and reactive to light.  Cardiovascular:     Rate and Rhythm: Normal rate and regular rhythm.     Pulses: Normal pulses.     Heart sounds: Normal heart sounds. No murmur heard. Pulmonary:     Effort: Pulmonary effort is normal. No respiratory distress.     Breath sounds: Normal breath sounds. No wheezing or rales.  Abdominal:     General: Bowel sounds are normal. There is no distension.     Palpations: Abdomen is soft.     Tenderness: There is abdominal tenderness in the suprapubic area and left lower quadrant. There is no  right CVA tenderness, left CVA tenderness, guarding or rebound. Negative signs include Murphy's sign and McBurney's sign.     Hernia: No hernia is present.  Musculoskeletal:        General: No deformity.     Cervical back: Neck supple.  Skin:    General: Skin is warm and dry.     Capillary Refill: Capillary refill takes less than 2 seconds.  Neurological:     General: No focal deficit present.     Mental Status: She is alert and oriented to person, place, and time. Mental status is at baseline.  Psychiatric:        Mood and Affect: Mood normal.    ED Results / Procedures / Treatments   Labs (all labs ordered are listed, but only abnormal results are displayed) Labs Reviewed  COMPREHENSIVE METABOLIC PANEL - Abnormal; Notable for the following components:      Result Value   CO2 21 (*)    Creatinine, Ser 1.02 (*)    Calcium 8.8 (*)    Anion gap 3 (*)    All other components within normal limits  CBC - Abnormal; Notable for the following components:   WBC 11.6 (*)    All other components within normal limits  URINE CULTURE  LIPASE, BLOOD  URINALYSIS, ROUTINE W REFLEX MICROSCOPIC  I-STAT BETA HCG BLOOD, ED ([facility], [facility], [facility] ONLY)    EKG None  Radiology CT Abdomen Pelvis W Contrast  Result Date: 07/26/2021 CLINICAL DATA:  Left lower quadrant abdominal pain. EXAM: CT ABDOMEN AND PELVIS WITH CONTRAST TECHNIQUE: Multidetector CT imaging of the abdomen and pelvis was performed using the standard protocol following bolus administration of intravenous contrast. RADIATION DOSE REDUCTION: This exam was performed according to the departmental dose-optimization program which includes automated exposure control, adjustment of the mA and/or kV according to patient size and/or use of iterative reconstruction technique. CONTRAST:  OMNIPAQUE IOHEXOL 300 MG/ML  SOLN COMPARISON:  None Available. FINDINGS: Lower chest: The visualized lung bases are clear. No intra-abdominal free air or free fluid.  Hepatobiliary: No focal liver abnormality is seen. No gallstones, gallbladder wall thickening, or biliary dilatation. Pancreas: Unremarkable. No pancreatic ductal dilatation or surrounding inflammatory changes. Spleen: Normal in size without focal abnormality. Adrenals/Urinary Tract: Adrenal glands are unremarkable. Kidneys are normal, without renal calculi, focal lesion, or hydronephrosis. Bladder is unremarkable. Stomach/Bowel: There is no bowel obstruction or active inflammation. The appendix is normal. Vascular/Lymphatic: The abdominal aorta and IVC are unremarkable. No portal venous gas. There is no adenopathy. Reproductive: The uterus is anteverted and grossly unremarkable. No adnexal masses. Other: None Musculoskeletal: Mild degenerative changes of the lower lumbar spine. No acute osseous pathology. IMPRESSION: No acute intra-abdominal or pelvic pathology. Electronically Signed   By: Burtis Junes  Radparvar M.D.   On: 07/26/2021 01:18    Procedures Procedures    Medications Ordered in ED Medications  ondansetron (ZOFRAN) injection 4 mg (4 mg Intravenous Given 07/26/21 0005)  HYDROmorphone (DILAUDID) injection 0.5 mg (0.5 mg Intravenous Given 07/26/21 0005)  iohexol (OMNIPAQUE) 300 MG/ML solution 100 mL (100 mLs Intravenous Contrast Given 07/26/21 0100)    ED Course/ Medical Decision Making/ A&P                           Medical Decision Making 31 year old female presents with lower abdominal pain and stated nausea for the last few days.  Denies history of the same.  Hypertensive on intake, vital signs otherwise normal.  Cardiopulmonary exam is normal, abdominal exam with suprapubic and left lower quadrant tenderness palpation as above.  Patient rating pain 9/10 at this time.   Amount and/or Complexity of Data Reviewed Labs: ordered.    Details: CBC with mild leukocytosis of 11.6, no anemia.  CMP with out metabolic derangement.  Creatinine very mildly elevated 1.02 without change in BUN.  UA  without evidence of infection at this time. Radiology: ordered and independent interpretation performed.    Details: CT images visualized by this provider.  No acute intra-abdominopelvic abnormality on imaging today.  Risk Prescription drug management.    Patient reevaluated, sleeping calmly, easily arousable to voice and appears comfortable.  Following reassuring CT and repeat abdominal exam, clinical concern for emergent underlying etiology such as AAA, intra-abdominal infection, or mesenteric ischemia is exceptionally low.  Suspect constipation or possible developing gastroenteritis as etiology for patient's symptoms.  Will discharge with prescription for Zofran and directions to utilize OTC stool softeners with close outpatient follow-up with her PCP.  The patient was comfortable with this plan. Verenice voiced understanding of her medical evaluation and treatment plan. Each of their questions answered to their expressed satisfaction.  Return precautions were given.  Patient is well-appearing, stable, and was discharged in good condition.  This chart was dictated using voice recognition software, Dragon. Despite the best efforts of this provider to proofread and correct errors, errors may still occur which can change documentation meaning.   Final Clinical Impression(s) / ED Diagnoses Final diagnoses:  Left lower quadrant abdominal pain    Rx / DC Orders ED Discharge Orders          Ordered    ondansetron (ZOFRAN-ODT) 4 MG disintegrating tablet  Every 8 hours PRN        07/26/21 0344              Masayo Fera, Eugene Gavia, PA-C 07/26/21 0520    Sabas Sous, MD 07/26/21 [phone]

## 2021-07-25 NOTE — ED Provider Triage Note (Signed)
Emergency Medicine Provider Triage Evaluation Note ? ?Candace Richardson , a 31 y.o. female  was evaluated in triage.  Pt complains of 3 days of nausea and lower abd pain. Frequency of urination + (no hematuria or urgency). ? ?Bms less regularly. No blood in stool.  ? ?No fevers ? ? ? ?Review of Systems  ?Positive: Abd pain, Nausea  ?Negative: Fever  ? ?Physical Exam  ?BP (!) 146/75 (BP Location: Right Arm)   Pulse 79   Temp 98.6 ?F (37 ?C) (Oral)   Resp 18   LMP 06/29/2021   SpO2 95%  ?Gen:   Awake, no distress   ?Resp:  Normal effort  ?MSK:   Moves extremities without difficulty  ?Other:  Abd NTTP ? ?Medical Decision Making  ?Medically screening exam initiated at 8:06 PM.  Appropriate orders placed.  Candace Richardson was informed that the remainder of the evaluation will be completed by another provider, this initial triage assessment does not replace that evaluation, and the importance of remaining in the ED until their evaluation is complete. ? ?Labs urine ?  ?Gailen Shelter, [place] ?07/25/21 2007 ? ?

## 2021-07-25 NOTE — ED Triage Notes (Signed)
Patient reports hypogastric pain for 3 days with mild nausea , no emesis or diarrhea, no fever or chills.  ?

## 2021-07-26 ENCOUNTER — Emergency Department (HOSPITAL_COMMUNITY): Payer: Medicaid Other

## 2021-07-26 DIAGNOSIS — R1032 Left lower quadrant pain: Secondary | ICD-10-CM | POA: Diagnosis not present

## 2021-07-26 MED ORDER — IOHEXOL 300 MG/ML  SOLN
100.0000 mL | Freq: Once | INTRAMUSCULAR | Status: AC | PRN
  Administered 2021-07-26: 100 mL via INTRAVENOUS

## 2021-07-26 MED ORDER — ONDANSETRON 4 MG PO TBDP
4.0000 mg | ORAL_TABLET | Freq: Three times a day (TID) | ORAL | 0 refills | Status: DC | PRN
Start: 2021-07-26 — End: 2023-01-17

## 2021-07-26 NOTE — Discharge Instructions (Addendum)
You were seen in the ER today for abdominal pain.  Your physical exam, vital signs, blood work And CT scan were reassuring. There does not appear to be any emergent problem in your belly at this time.  Please follow-up with your primary care doctor.  I suspect you are constipated you may use over-the-counter stool softener such as Dulcolax or MiraLAX to attempt to relieve your constipation.  Please follow with your PCP and return to the ER with any severe symptoms.

## 2021-07-27 LAB — URINE CULTURE

## 2021-08-09 DIAGNOSIS — Z419 Encounter for procedure for purposes other than remedying health state, unspecified: Secondary | ICD-10-CM | POA: Diagnosis not present

## 2021-08-17 ENCOUNTER — Other Ambulatory Visit: Payer: Self-pay | Admitting: Internal Medicine

## 2021-08-17 DIAGNOSIS — Z0000 Encounter for general adult medical examination without abnormal findings: Secondary | ICD-10-CM | POA: Diagnosis not present

## 2021-08-17 DIAGNOSIS — Z13220 Encounter for screening for lipoid disorders: Secondary | ICD-10-CM | POA: Diagnosis not present

## 2021-08-17 DIAGNOSIS — E669 Obesity, unspecified: Secondary | ICD-10-CM | POA: Diagnosis not present

## 2021-08-17 DIAGNOSIS — F17210 Nicotine dependence, cigarettes, uncomplicated: Secondary | ICD-10-CM | POA: Diagnosis not present

## 2021-08-17 DIAGNOSIS — E559 Vitamin D deficiency, unspecified: Secondary | ICD-10-CM | POA: Diagnosis not present

## 2021-08-17 DIAGNOSIS — Z716 Tobacco abuse counseling: Secondary | ICD-10-CM | POA: Diagnosis not present

## 2021-08-17 DIAGNOSIS — N760 Acute vaginitis: Secondary | ICD-10-CM | POA: Diagnosis not present

## 2021-08-17 DIAGNOSIS — Z131 Encounter for screening for diabetes mellitus: Secondary | ICD-10-CM | POA: Diagnosis not present

## 2021-08-18 LAB — CBC
HCT: 45 % (ref 35.0–45.0)
Hemoglobin: 14.7 g/dL (ref 11.7–15.5)
MCH: 29.5 pg (ref 27.0–33.0)
MCHC: 32.7 g/dL (ref 32.0–36.0)
MCV: 90.2 fL (ref 80.0–100.0)
MPV: 11.3 fL (ref 7.5–12.5)
Platelets: 203 10*3/uL (ref 140–400)
RBC: 4.99 10*6/uL (ref 3.80–5.10)
RDW: 13.7 % (ref 11.0–15.0)
WBC: 9.2 10*3/uL (ref 3.8–10.8)

## 2021-08-18 LAB — C. TRACHOMATIS/N. GONORRHOEAE RNA
C. trachomatis RNA, TMA: NOT DETECTED
N. gonorrhoeae RNA, TMA: NOT DETECTED

## 2021-08-18 LAB — COMPLETE METABOLIC PANEL WITH GFR
AG Ratio: 1.8 (calc) (ref 1.0–2.5)
ALT: 10 U/L (ref 6–29)
AST: 14 U/L (ref 10–30)
Albumin: 4.2 g/dL (ref 3.6–5.1)
Alkaline phosphatase (APISO): 70 U/L (ref 31–125)
BUN: 12 mg/dL (ref 7–25)
CO2: 23 mmol/L (ref 20–32)
Calcium: 9.2 mg/dL (ref 8.6–10.2)
Chloride: 108 mmol/L (ref 98–110)
Creat: 0.92 mg/dL (ref 0.50–0.96)
Globulin: 2.3 g/dL (calc) (ref 1.9–3.7)
Glucose, Bld: 87 mg/dL (ref 65–99)
Potassium: 4.5 mmol/L (ref 3.5–5.3)
Sodium: 140 mmol/L (ref 135–146)
Total Bilirubin: 0.3 mg/dL (ref 0.2–1.2)
Total Protein: 6.5 g/dL (ref 6.1–8.1)
eGFR: 87 mL/min/{1.73_m2} (ref >=60)

## 2021-08-18 LAB — LIPID PANEL
Cholesterol: 201 mg/dL — ABNORMAL HIGH (ref <200)
HDL: 40 mg/dL — ABNORMAL LOW (ref >=50)
LDL Cholesterol (Calc): 142 mg/dL (calc) — ABNORMAL HIGH
Non-HDL Cholesterol (Calc): 161 mg/dL (calc) — ABNORMAL HIGH (ref <130)
Total CHOL/HDL Ratio: 5 (calc) — ABNORMAL HIGH (ref <5.0)
Triglycerides: 82 mg/dL (ref <150)

## 2021-08-18 LAB — VITAMIN D 25 HYDROXY (VIT D DEFICIENCY, FRACTURES): Vit D, 25-Hydroxy: 31 ng/mL (ref 30–100)

## 2021-08-18 LAB — TSH: TSH: 0.63 mIU/L

## 2021-09-08 DIAGNOSIS — Z419 Encounter for procedure for purposes other than remedying health state, unspecified: Secondary | ICD-10-CM | POA: Diagnosis not present

## 2021-10-09 DIAGNOSIS — Z419 Encounter for procedure for purposes other than remedying health state, unspecified: Secondary | ICD-10-CM | POA: Diagnosis not present

## 2021-11-09 DIAGNOSIS — Z419 Encounter for procedure for purposes other than remedying health state, unspecified: Secondary | ICD-10-CM | POA: Diagnosis not present

## 2021-12-09 DIAGNOSIS — Z419 Encounter for procedure for purposes other than remedying health state, unspecified: Secondary | ICD-10-CM | POA: Diagnosis not present

## 2022-01-09 DIAGNOSIS — Z419 Encounter for procedure for purposes other than remedying health state, unspecified: Secondary | ICD-10-CM | POA: Diagnosis not present

## 2022-02-08 DIAGNOSIS — Z419 Encounter for procedure for purposes other than remedying health state, unspecified: Secondary | ICD-10-CM | POA: Diagnosis not present

## 2022-03-11 DIAGNOSIS — Z419 Encounter for procedure for purposes other than remedying health state, unspecified: Secondary | ICD-10-CM | POA: Diagnosis not present

## 2022-03-12 DIAGNOSIS — H9202 Otalgia, left ear: Secondary | ICD-10-CM | POA: Diagnosis not present

## 2022-03-12 DIAGNOSIS — R0982 Postnasal drip: Secondary | ICD-10-CM | POA: Diagnosis not present

## 2022-03-28 DIAGNOSIS — H9212 Otorrhea, left ear: Secondary | ICD-10-CM | POA: Diagnosis not present

## 2022-03-28 DIAGNOSIS — Z9622 Myringotomy tube(s) status: Secondary | ICD-10-CM | POA: Diagnosis not present

## 2022-03-28 DIAGNOSIS — H6993 Unspecified Eustachian tube disorder, bilateral: Secondary | ICD-10-CM | POA: Diagnosis not present

## 2022-03-29 DIAGNOSIS — H9212 Otorrhea, left ear: Secondary | ICD-10-CM | POA: Insufficient documentation

## 2022-03-29 HISTORY — DX: Otorrhea, left ear: H92.12

## 2022-04-11 DIAGNOSIS — Z419 Encounter for procedure for purposes other than remedying health state, unspecified: Secondary | ICD-10-CM | POA: Diagnosis not present

## 2022-05-10 DIAGNOSIS — Z419 Encounter for procedure for purposes other than remedying health state, unspecified: Secondary | ICD-10-CM | POA: Diagnosis not present

## 2022-06-10 DIAGNOSIS — Z419 Encounter for procedure for purposes other than remedying health state, unspecified: Secondary | ICD-10-CM | POA: Diagnosis not present

## 2022-07-10 DIAGNOSIS — Z419 Encounter for procedure for purposes other than remedying health state, unspecified: Secondary | ICD-10-CM | POA: Diagnosis not present

## 2022-08-07 IMAGING — CT CT ABD-PELV W/ CM
2 of 4 series · 17 of 46 positions shown, 19 images · IV contrast (agent unspecified)
Comparison: None Available.

CLINICAL DATA: Left lower quadrant abdominal pain.

EXAM:
CT ABDOMEN AND PELVIS WITH CONTRAST
TECHNIQUE: Multidetector CT imaging of the abdomen and pelvis was performed
using the standard protocol following bolus administration of
intravenous contrast.

[Series 3: abdomen 5.0 · axial · 0.92mm/px · z∈[-594,-169]mm · 14 of 95 slices shown, 16 images]
[im 5/95  soft-tissue]
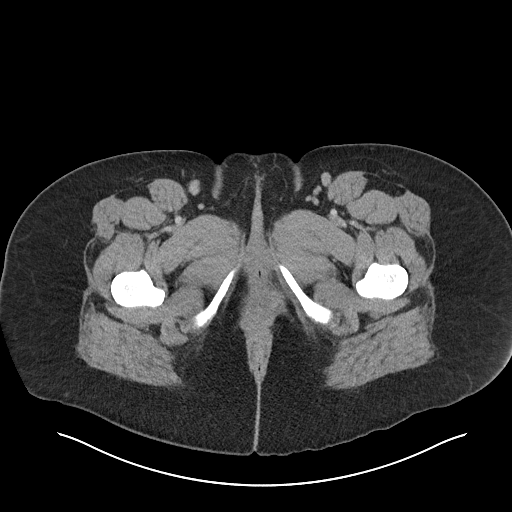
[im 5/95  bone]
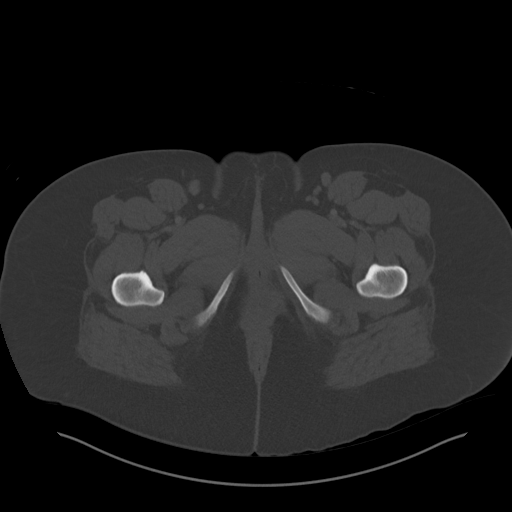
[im 15/95  soft-tissue]
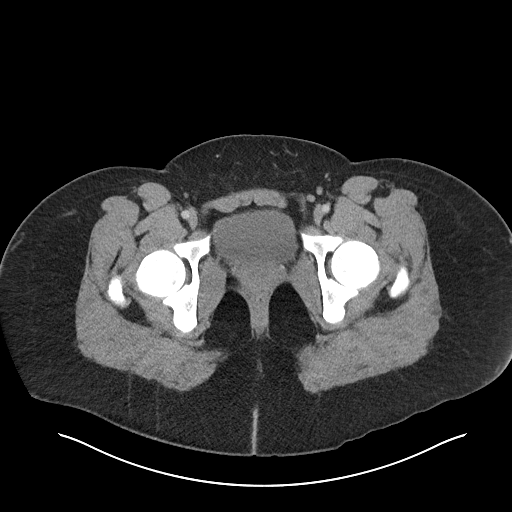
[im 19/95  soft-tissue]
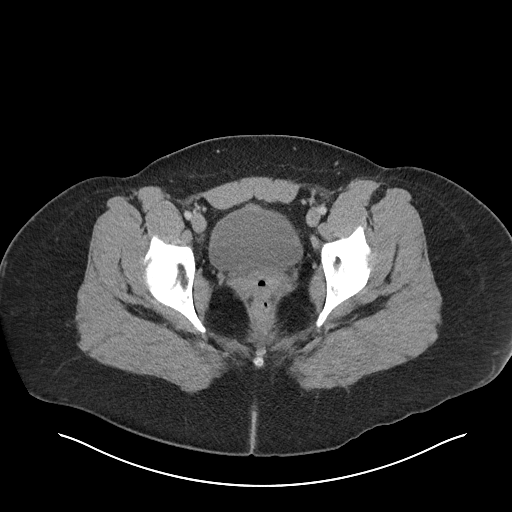
[im 24/95  soft-tissue]
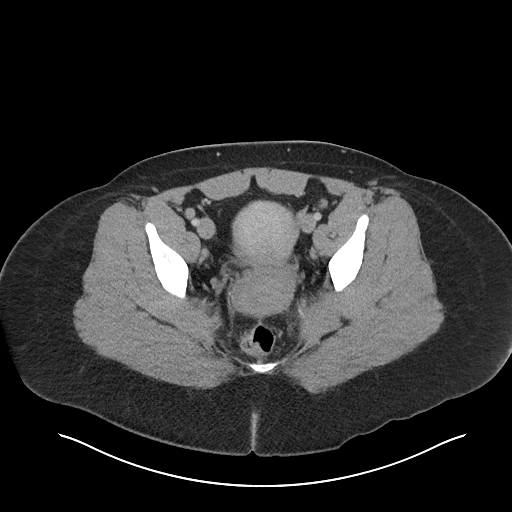
[im 33/95  soft-tissue]
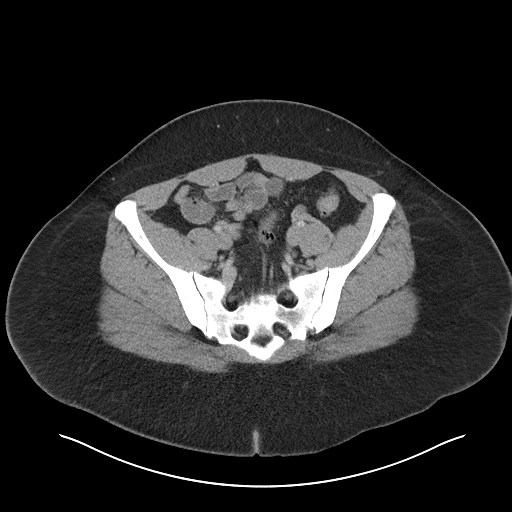
[im 38/95  soft-tissue]
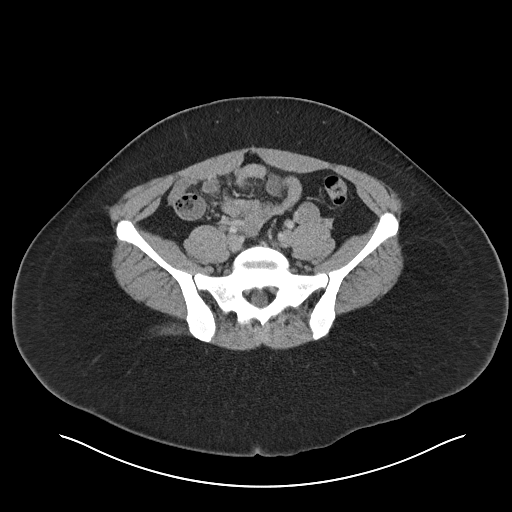
[im 43/95  soft-tissue]
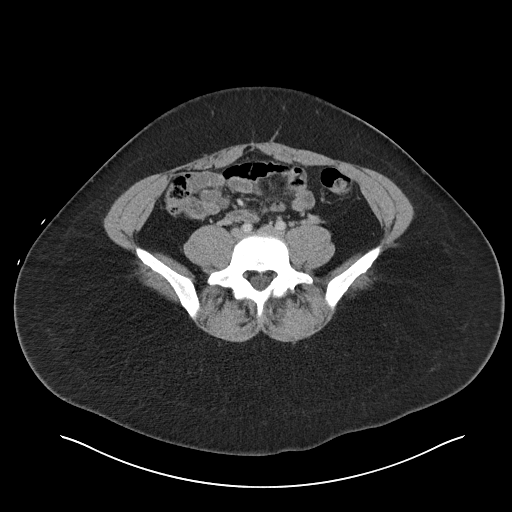
[im 52/95  soft-tissue]
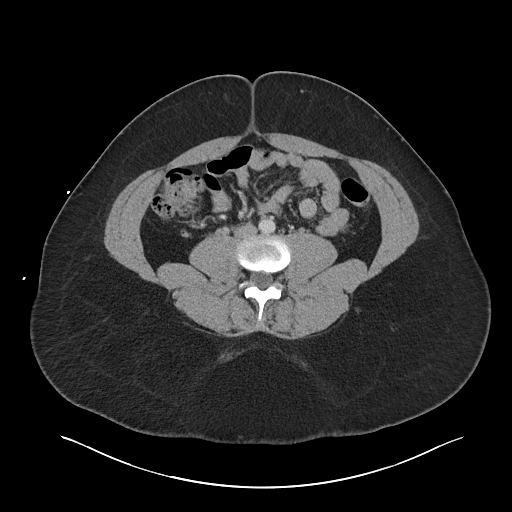
[im 57/95  soft-tissue]
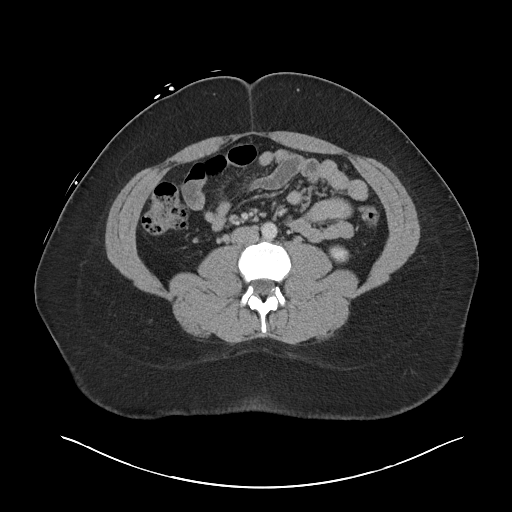
[im 57/95  bone]
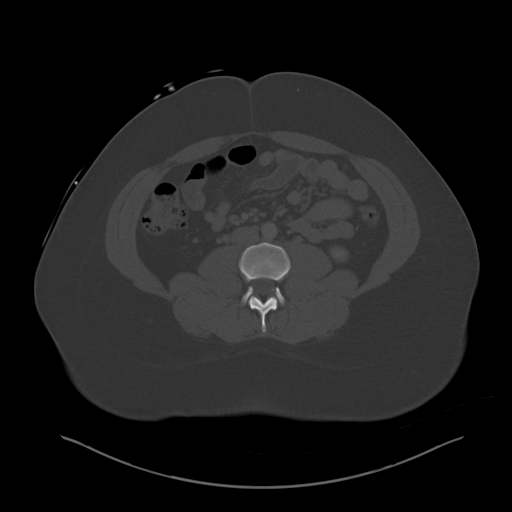
[im 62/95  soft-tissue]
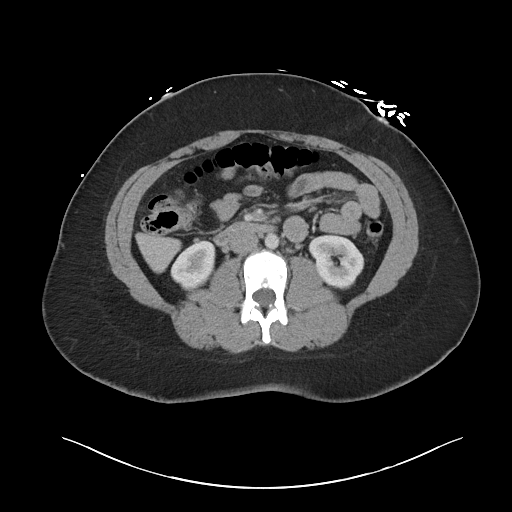
[im 71/95  soft-tissue]
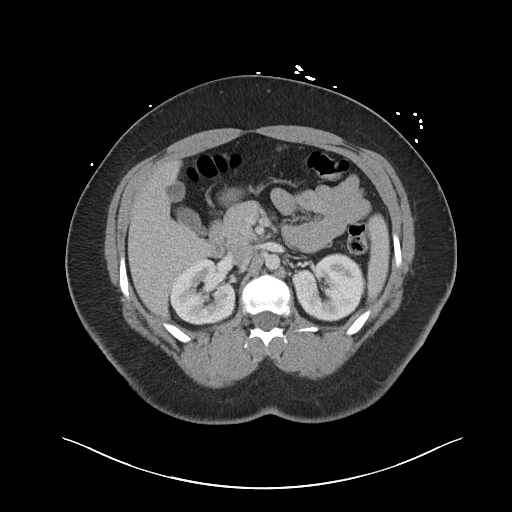
[im 76/95  soft-tissue]
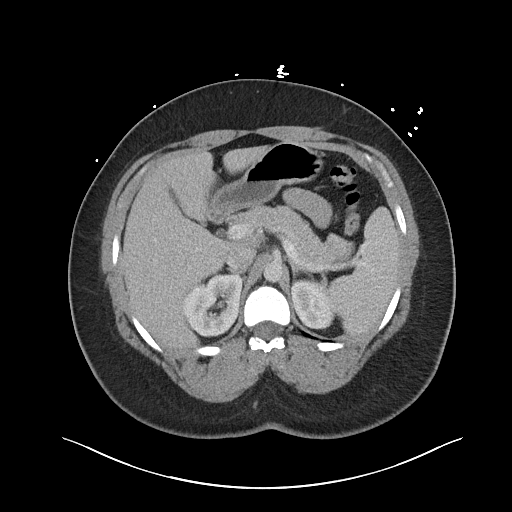
[im 80/95  soft-tissue]
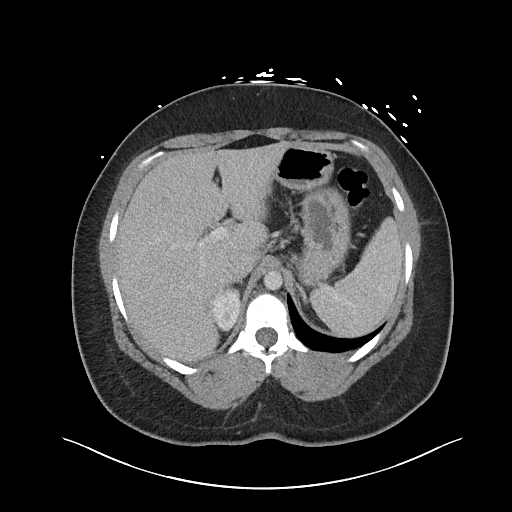
[im 90/95  soft-tissue]
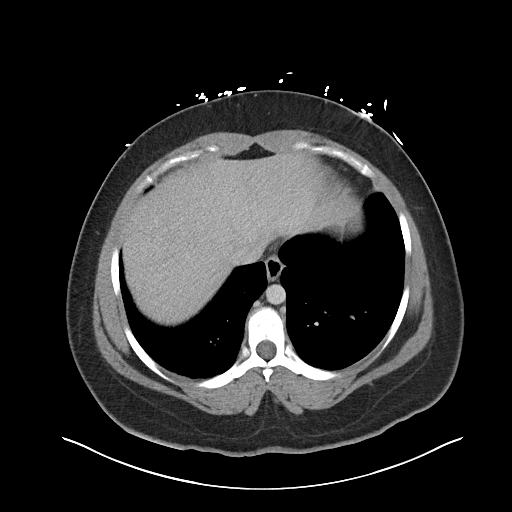

[Series 6: abdomen 3.0 mpr cor · coronal · 0.94mm/px · 3 of 122 slices shown]
[im 41/122  soft-tissue]
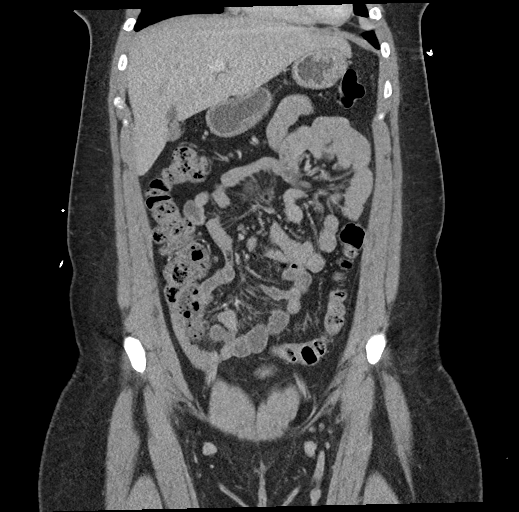
[im 54/122  soft-tissue]
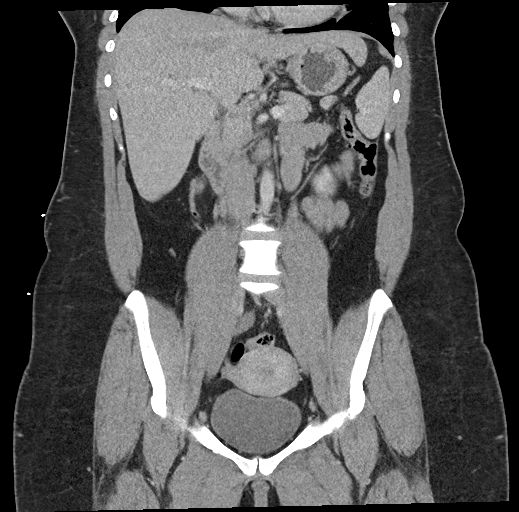
[im 68/122  soft-tissue]
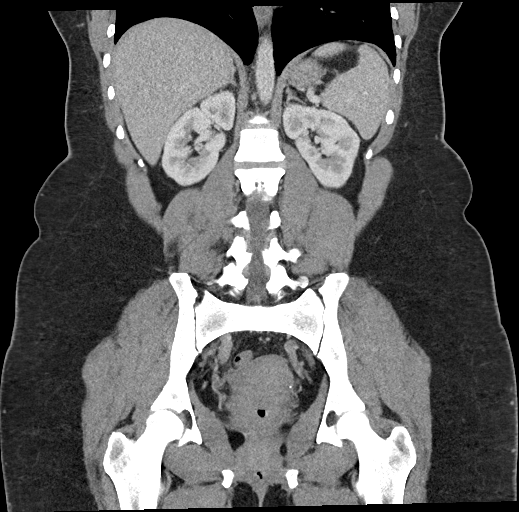

[17 of 46 positions shown; findings below may reference images not displayed]

RADIATION DOSE REDUCTION: This exam was performed according to the
departmental dose-optimization program which includes automated
exposure control, adjustment of the mA and/or kV according to
patient size and/or use of iterative reconstruction technique.

CONTRAST:  100mL OMNIPAQUE IOHEXOL 300 MG/ML  SOLN
FINDINGS: Lower chest: The visualized lung bases are clear.

No intra-abdominal free air or free fluid.

Hepatobiliary: No focal liver abnormality is seen. No gallstones,
gallbladder wall thickening, or biliary dilatation.

Pancreas: Unremarkable. No pancreatic ductal dilatation or
surrounding inflammatory changes.

Spleen: Normal in size without focal abnormality.

Adrenals/Urinary Tract: Adrenal glands are unremarkable. Kidneys are
normal, without renal calculi, focal lesion, or hydronephrosis.
Bladder is unremarkable.

Stomach/Bowel: There is no bowel obstruction or active inflammation.
The appendix is normal.

Vascular/Lymphatic: The abdominal aorta and IVC are unremarkable. No
portal venous gas. There is no adenopathy.

Reproductive: The uterus is anteverted and grossly unremarkable. No
adnexal masses.

Other: None

Musculoskeletal: Mild degenerative changes of the lower lumbar
spine. No acute osseous pathology.
IMPRESSION: No acute intra-abdominal or pelvic pathology.

## 2022-08-10 DIAGNOSIS — Z419 Encounter for procedure for purposes other than remedying health state, unspecified: Secondary | ICD-10-CM | POA: Diagnosis not present

## 2022-09-09 DIAGNOSIS — Z419 Encounter for procedure for purposes other than remedying health state, unspecified: Secondary | ICD-10-CM | POA: Diagnosis not present

## 2022-10-10 DIAGNOSIS — Z419 Encounter for procedure for purposes other than remedying health state, unspecified: Secondary | ICD-10-CM | POA: Diagnosis not present

## 2022-10-23 ENCOUNTER — Other Ambulatory Visit (HOSPITAL_COMMUNITY)
Admission: RE | Admit: 2022-10-23 | Discharge: 2022-10-23 | Disposition: A | Discharge status: Home / Self Care | Payer: Medicaid Other | Source: Ambulatory Visit | Attending: Obstetrics and Gynecology | Admitting: Obstetrics and Gynecology

## 2022-10-23 ENCOUNTER — Ambulatory Visit (INDEPENDENT_AMBULATORY_CARE_PROVIDER_SITE_OTHER): Payer: Medicaid Other

## 2022-10-23 DIAGNOSIS — Z3200 Encounter for pregnancy test, result unknown: Secondary | ICD-10-CM

## 2022-10-23 DIAGNOSIS — Z3202 Encounter for pregnancy test, result negative: Secondary | ICD-10-CM | POA: Diagnosis not present

## 2022-10-23 DIAGNOSIS — N898 Other specified noninflammatory disorders of vagina: Secondary | ICD-10-CM | POA: Insufficient documentation

## 2022-10-23 LAB — POCT URINE PREGNANCY: Preg Test, Ur: NEGATIVE

## 2022-10-23 NOTE — Progress Notes (Signed)
..  SUBJECTIVE:  32 y.o. female complains of vaginal discharge and abdominal pain. Denies abnormal vaginal bleeding or significant pelvic pain or fever. No UTI symptoms. Denies history of known exposure to STD. Requests UPT because LMP 09/12/22  No LMP recorded.  OBJECTIVE:  She appears well, afebrile. Urine dipstick: not done.  ASSESSMENT:  Vaginal Discharge  LMP 09/12/22- Negative UPT in office today   PLAN:  HIV, RPR, Hep B, Hep C drawn;  GC, chlamydia, trichomonas, BVAG, CVAG probe sent to lab. UPT performed Treatment: To be determined once lab results are received ROV prn if symptoms persist or worsen. Advised pt to follow up with provider related to irregular menstrual cycles.

## 2022-10-24 LAB — CERVICOVAGINAL ANCILLARY ONLY
Bacterial Vaginitis (gardnerella): NEGATIVE
Candida Glabrata: NEGATIVE
Candida Vaginitis: NEGATIVE
Chlamydia: NEGATIVE
Comment: NEGATIVE
Comment: NEGATIVE
Comment: NEGATIVE
Comment: NEGATIVE
Comment: NEGATIVE
Comment: NORMAL
Neisseria Gonorrhea: NEGATIVE
Trichomonas: NEGATIVE

## 2022-10-24 LAB — HEPATITIS B SURFACE ANTIGEN: Hepatitis B Surface Ag: NEGATIVE

## 2022-10-24 LAB — RPR: RPR Ser Ql: NONREACTIVE

## 2022-10-24 LAB — HIV ANTIBODY (ROUTINE TESTING W REFLEX): HIV Screen 4th Generation wRfx: NONREACTIVE

## 2022-10-24 LAB — HEPATITIS C ANTIBODY: Hep C Virus Ab: NONREACTIVE

## 2022-11-07 DIAGNOSIS — H6122 Impacted cerumen, left ear: Secondary | ICD-10-CM | POA: Diagnosis not present

## 2022-11-07 DIAGNOSIS — H9212 Otorrhea, left ear: Secondary | ICD-10-CM | POA: Diagnosis not present

## 2022-11-07 DIAGNOSIS — Z9622 Myringotomy tube(s) status: Secondary | ICD-10-CM | POA: Diagnosis not present

## 2022-11-10 DIAGNOSIS — Z419 Encounter for procedure for purposes other than remedying health state, unspecified: Secondary | ICD-10-CM | POA: Diagnosis not present

## 2022-12-10 DIAGNOSIS — Z419 Encounter for procedure for purposes other than remedying health state, unspecified: Secondary | ICD-10-CM | POA: Diagnosis not present

## 2022-12-24 ENCOUNTER — Other Ambulatory Visit (HOSPITAL_COMMUNITY)
Admission: RE | Admit: 2022-12-24 | Discharge: 2022-12-24 | Disposition: A | Discharge status: Home / Self Care | Payer: Medicaid Other | Source: Ambulatory Visit | Attending: Obstetrics and Gynecology | Admitting: Obstetrics and Gynecology

## 2022-12-24 ENCOUNTER — Ambulatory Visit: Payer: Medicaid Other

## 2022-12-24 VITALS — BP 132/81 | HR 83

## 2022-12-24 DIAGNOSIS — N898 Other specified noninflammatory disorders of vagina: Secondary | ICD-10-CM | POA: Insufficient documentation

## 2022-12-24 NOTE — Progress Notes (Signed)
..  SUBJECTIVE:  32 y.o. female complains of vaginal discharge, odor, irritation for 1 week(s). Denies abnormal vaginal bleeding or significant pelvic pain or fever. No UTI symptoms. Denies history of known exposure to STD.  No LMP recorded.  OBJECTIVE:  She appears well, afebrile. Urine dipstick: not done.  ASSESSMENT:  Vaginal Discharge/irritation Vaginal Odor   PLAN:  GC, chlamydia, trichomonas, BVAG, CVAG probe sent to lab. Treatment: To be determined once lab results are received ROV prn if symptoms persist or worsen.

## 2022-12-25 LAB — CERVICOVAGINAL ANCILLARY ONLY
Bacterial Vaginitis (gardnerella): POSITIVE — AB
Candida Glabrata: NEGATIVE
Candida Vaginitis: NEGATIVE
Chlamydia: NEGATIVE
Comment: NEGATIVE
Comment: NEGATIVE
Comment: NEGATIVE
Comment: NEGATIVE
Comment: NEGATIVE
Comment: NORMAL
Neisseria Gonorrhea: NEGATIVE
Trichomonas: NEGATIVE

## 2022-12-31 ENCOUNTER — Other Ambulatory Visit: Payer: Self-pay

## 2022-12-31 DIAGNOSIS — N760 Acute vaginitis: Secondary | ICD-10-CM

## 2022-12-31 MED ORDER — METRONIDAZOLE 500 MG PO TABS: 500.0000 mg | ORAL_TABLET | Freq: Two times a day (BID) | ORAL | 0 refills | Status: DC

## 2023-01-10 DIAGNOSIS — Z419 Encounter for procedure for purposes other than remedying health state, unspecified: Secondary | ICD-10-CM | POA: Diagnosis not present

## 2023-01-14 ENCOUNTER — Ambulatory Visit: Payer: Medicaid Other

## 2023-01-14 VITALS — BP 118/75 | HR 71 | Wt 282.3 lb

## 2023-01-14 DIAGNOSIS — Z3201 Encounter for pregnancy test, result positive: Secondary | ICD-10-CM

## 2023-01-14 DIAGNOSIS — N912 Amenorrhea, unspecified: Secondary | ICD-10-CM

## 2023-01-14 LAB — POCT URINE PREGNANCY: Preg Test, Ur: POSITIVE — AB

## 2023-01-14 NOTE — Progress Notes (Signed)
Candace Richardson presents today for UPT. She has no unusual complaints. LMP: 11/25/2022    OBJECTIVE: Appears well, in no apparent distress.  OB History     Gravida  2   Para  2   Term  1   Preterm  1   AB      Living  2      SAB      IAB      Ectopic      Multiple  0   Live Births  2          Home UPT Result: positive In-Office UPT result: positive  I have reviewed the patient's medical, obstetrical, social, and family histories, and medications.   ASSESSMENT: Positive pregnancy test  PLAN Prenatal care to be completed at: [facility]

## 2023-01-15 DIAGNOSIS — H9012 Conductive hearing loss, unilateral, left ear, with unrestricted hearing on the contralateral side: Secondary | ICD-10-CM | POA: Diagnosis not present

## 2023-01-16 ENCOUNTER — Inpatient Hospital Stay (HOSPITAL_COMMUNITY)
Admission: AD | Admit: 2023-01-16 | Discharge: 2023-01-17 | Disposition: A | Discharge status: Home / Self Care | Payer: Medicaid Other | Attending: Obstetrics and Gynecology | Admitting: Obstetrics and Gynecology

## 2023-01-16 ENCOUNTER — Inpatient Hospital Stay (HOSPITAL_COMMUNITY): Payer: Medicaid Other

## 2023-01-16 DIAGNOSIS — Z3A00 Weeks of gestation of pregnancy not specified: Secondary | ICD-10-CM | POA: Diagnosis not present

## 2023-01-16 DIAGNOSIS — O26891 Other specified pregnancy related conditions, first trimester: Secondary | ICD-10-CM | POA: Diagnosis not present

## 2023-01-16 DIAGNOSIS — M549 Dorsalgia, unspecified: Secondary | ICD-10-CM | POA: Diagnosis present

## 2023-01-16 DIAGNOSIS — Z3A01 Less than 8 weeks gestation of pregnancy: Secondary | ICD-10-CM

## 2023-01-16 DIAGNOSIS — O3680X0 Pregnancy with inconclusive fetal viability, not applicable or unspecified: Secondary | ICD-10-CM | POA: Diagnosis not present

## 2023-01-16 DIAGNOSIS — O3481 Maternal care for other abnormalities of pelvic organs, first trimester: Secondary | ICD-10-CM | POA: Diagnosis not present

## 2023-01-16 DIAGNOSIS — R102 Pelvic and perineal pain: Secondary | ICD-10-CM | POA: Insufficient documentation

## 2023-01-16 DIAGNOSIS — R1030 Lower abdominal pain, unspecified: Secondary | ICD-10-CM | POA: Diagnosis present

## 2023-01-16 LAB — CBC
HCT: 42 % (ref 36.0–46.0)
Hemoglobin: 14.4 g/dL (ref 12.0–15.0)
MCH: 30.4 pg (ref 26.0–34.0)
MCHC: 34.3 g/dL (ref 30.0–36.0)
MCV: 88.6 fL (ref 80.0–100.0)
Platelets: 201 10*3/uL (ref 150–400)
RBC: 4.74 MIL/uL (ref 3.87–5.11)
RDW: 14.4 % (ref 11.5–15.5)
WBC: 10.6 10*3/uL — ABNORMAL HIGH (ref 4.0–10.5)
nRBC: 0 % (ref 0.0–0.2)

## 2023-01-16 LAB — COMPREHENSIVE METABOLIC PANEL
ALT: 16 U/L (ref 0–44)
AST: 15 U/L (ref 15–41)
Albumin: 3.6 g/dL (ref 3.5–5.0)
Alkaline Phosphatase: 56 U/L (ref 38–126)
Anion gap: 6 (ref 5–15)
BUN: 10 mg/dL (ref 6–20)
CO2: 22 mmol/L (ref 22–32)
Calcium: 8.7 mg/dL — ABNORMAL LOW (ref 8.9–10.3)
Chloride: 110 mmol/L (ref 98–111)
Creatinine, Ser: 0.93 mg/dL (ref 0.44–1.00)
GFR, Estimated: >60 mL/min (ref >60)
Glucose, Bld: 85 mg/dL (ref 70–99)
Potassium: 3.9 mmol/L (ref 3.5–5.1)
Sodium: 138 mmol/L (ref 135–145)
Total Bilirubin: 0.6 mg/dL (ref <1.2)
Total Protein: 6.2 g/dL — ABNORMAL LOW (ref 6.5–8.1)

## 2023-01-16 LAB — URINALYSIS, ROUTINE W REFLEX MICROSCOPIC
Bilirubin Urine: NEGATIVE
Glucose, UA: NEGATIVE mg/dL
Hgb urine dipstick: NEGATIVE
Ketones, ur: NEGATIVE mg/dL
Leukocytes,Ua: NEGATIVE
Nitrite: NEGATIVE
Protein, ur: NEGATIVE mg/dL
Specific Gravity, Urine: 1.027 (ref 1.005–1.030)
pH: 5 (ref 5.0–8.0)

## 2023-01-16 LAB — HCG, QUANTITATIVE, PREGNANCY: hCG, Beta Chain, Quant, S: 1892 m[IU]/mL — ABNORMAL HIGH (ref <5)

## 2023-01-16 MED ORDER — ACETAMINOPHEN 325 MG PO TABS
650.0000 mg | ORAL_TABLET | Freq: Once | ORAL | Status: AC
  Administered 2023-01-17: 650 mg via ORAL
  Filled 2023-01-16: qty 2

## 2023-01-16 NOTE — MAU Note (Signed)
..  Candace Richardson is a 32 y.o. at [redacted]w[redacted]d here in MAU reporting: constant bilateral lower abdominal cramping that radiates to her back. Has not taken medication for the pain Denies vaginal bleeding.   Pain score: 8/10 Vitals:   01/16/23 1942  BP: (!) 150/60  Pulse: (!) 57  Resp: 18  Temp: 98.1 F (36.7 C)  SpO2: 100%     FHT:n/a Lab orders placed from triage:  UA

## 2023-01-16 NOTE — MAU Provider Note (Signed)
Chief Complaint: Abdominal Pain and Back Pain   Event Date/Time   First Provider Initiated Contact with Patient 01/16/23 2330        SUBJECTIVE HPI: Candace Richardson is a 32 y.o. G3P1102 at [redacted]w[redacted]d by LMP who presents to maternity admissions reporting lower abdominal pain. . She denies vaginal bleeding, vaginal itching/burning, urinary symptoms, n/v, or fever/chills.     Abdominal Pain The problem occurs constantly. The problem has been unchanged. The pain is located in the suprapubic region, RLQ and LLQ. The quality of the pain is cramping. Pertinent negatives include no constipation, diarrhea or dysuria. The pain is relieved by Nothing. She has tried nothing for the symptoms.   RN Note: Candace Richardson is a 32 y.o. at [redacted]w[redacted]d here in MAU reporting: constant bilateral lower abdominal cramping that radiates to her back. Has not taken medication for the pain Denies vaginal bleeding  Past Medical History:  Diagnosis Date   Bipolar 1 disorder (HCC)    Depression    Gonorrhea    Polycystic ovary disease    PTSD (post-traumatic stress disorder)    Trichomonas infection    Past Surgical History:  Procedure Laterality Date   EAR EXAMINATION UNDER ANESTHESIA     WISDOM TOOTH EXTRACTION     Social History   Socioeconomic History   Marital status: Single    Spouse name: Not on file   Number of children: Not on file   Years of education: Not on file   Highest education level: Not on file  Occupational History   Occupation: Part-time student    Comment: [employer]  Tobacco Use   Smoking status: Light Smoker    Types: Cigarettes   Smokeless tobacco: Never  Substance and Sexual Activity   Alcohol use: No    Alcohol/week: 0.0 standard drinks of alcohol   Drug use: No   Sexual activity: Yes    Birth control/protection: None    Comment: Last encounter 2 weeks  Other Topics Concern   Not on file  Social History Narrative   Not on file   Social Determinants of Health   Financial Resource  Strain: Not on file  Food Insecurity: Not on file  Transportation Needs: Not on file  Physical Activity: Not on file  Stress: Not on file  Social Connections: Unknown (03/12/2022)   Received from [facility]   Social Network    Social Network: Not on file  Intimate Partner Violence: Unknown (03/12/2022)   Received from [facility]   HITS    Physically Hurt: Not on file    Insult or Talk Down To: Not on file    Threaten Physical Harm: Not on file    Scream or Curse: Not on file   No current facility-administered medications on file prior to encounter.   Current Outpatient Medications on File Prior to Encounter  Medication Sig Dispense Refill   fluconazole (DIFLUCAN) 200 MG tablet Take 1 tablet (200 mg total) by mouth every 3 (three) days. (Patient not taking: Reported on 10/23/2022) 3 tablet 2   ibuprofen (ADVIL,MOTRIN) 800 MG tablet Take 1 tablet (800 mg total) by mouth every 8 (eight) hours as needed. (Patient not taking: Reported on 12/16/2018) 30 tablet 5   methocarbamol (ROBAXIN) 500 MG tablet Take 1 tablet (500 mg total) by mouth 2 (two) times daily. (Patient not taking: Reported on 05/05/2018) 20 tablet 0   metroNIDAZOLE (FLAGYL) 500 MG tablet Take 1 tablet (500 mg total) by mouth 2 (two) times daily. (  Patient not taking: Reported on 04/10/2021) 14 tablet 0   metroNIDAZOLE (FLAGYL) 500 MG tablet Take 1 tablet (500 mg total) by mouth 2 (two) times daily. (Patient not taking: Reported on 10/23/2022) 14 tablet 2   metroNIDAZOLE (FLAGYL) 500 MG tablet Take 1 tablet (500 mg total) by mouth 2 (two) times daily. (Patient not taking: Reported on 01/14/2023) 14 tablet 0   naproxen (NAPROSYN) 500 MG tablet Take 1 tablet (500 mg total) by mouth 2 (two) times daily with a meal. (Patient not taking: Reported on 11/04/2019) 20 tablet 0   ondansetron (ZOFRAN-ODT) 4 MG disintegrating tablet Take 1 tablet (4 mg total) by mouth every 8 (eight) hours as needed for nausea or vomiting. (Patient not taking:  Reported on 10/23/2022) 12 tablet 0   Allergies  Allergen Reactions   Penicillins Hives and Other (See Comments)    Rocephin w/out reaction. Has patient had a PCN reaction causing immediate rash, facial/tongue/throat swelling, SOB or lightheadedness with hypotension: yes Has patient had a PCN reaction causing severe rash involving mucus membranes or skin necrosis: No Has patient had a PCN reaction that required hospitalization No Has patient had a PCN reaction occurring within the last 10 years: No If all of the above answers are "NO", then may proceed with Cephalosporin use.     I have reviewed patient's Past Medical Hx, Surgical Hx, Family Hx, Social Hx, medications and allergies.   ROS:  Review of Systems  Gastrointestinal:  Positive for abdominal pain. Negative for constipation and diarrhea.  Genitourinary:  Negative for dysuria.   Review of Systems  Other systems negative   Physical Exam  Physical Exam Patient Vitals for the past 24 hrs:  BP Temp Temp src Pulse Resp SpO2 Height Weight  01/16/23 1942 (!) 150/60 98.1 F (36.7 C) Oral (!) 57 18 100 % 6' (1.829 m) 129 kg   Constitutional: Well-developed, well-nourished female in no acute distress.  Cardiovascular: normal rate Respiratory: normal effort GI: Abd soft, non-tender.  MS: Extremities nontender, no edema, normal ROM Neurologic: Alert and oriented x 4.  GU: Neg CVAT.  PELVIC EXAM: deferred in lieu of ultrasound  LAB RESULTS Results for orders placed or performed during the hospital encounter of 01/16/23 (from the past 24 hour(s))  Urinalysis, Routine w reflex microscopic -Urine, Clean Catch     Status: None   Collection Time: 01/16/23  7:45 PM  Result Value Ref Range   Color, Urine YELLOW YELLOW   APPearance CLEAR CLEAR   Specific Gravity, Urine 1.027 1.005 - 1.030   pH 5.0 5.0 - 8.0   Glucose, UA NEGATIVE NEGATIVE mg/dL   Hgb urine dipstick NEGATIVE NEGATIVE   Bilirubin Urine NEGATIVE NEGATIVE    Ketones, ur NEGATIVE NEGATIVE mg/dL   Protein, ur NEGATIVE NEGATIVE mg/dL   Nitrite NEGATIVE NEGATIVE   Leukocytes,Ua NEGATIVE NEGATIVE  CBC     Status: Abnormal   Collection Time: 01/16/23  8:47 PM  Result Value Ref Range   WBC 10.6 (H) 4.0 - 10.5 K/uL   RBC 4.74 3.87 - 5.11 MIL/uL   Hemoglobin 14.4 12.0 - 15.0 g/dL   HCT 82.9 56.2 - 13.0 %   MCV 88.6 80.0 - 100.0 fL   MCH 30.4 26.0 - 34.0 pg   MCHC 34.3 30.0 - 36.0 g/dL   RDW 86.5 78.4 - 69.6 %   Platelets 201 150 - 400 K/uL   nRBC 0.0 0.0 - 0.2 %  Comprehensive metabolic panel     Status: Abnormal  Collection Time: 01/16/23  8:47 PM  Result Value Ref Range   Sodium 138 135 - 145 mmol/L   Potassium 3.9 3.5 - 5.1 mmol/L   Chloride 110 98 - 111 mmol/L   CO2 22 22 - 32 mmol/L   Glucose, Bld 85 70 - 99 mg/dL   BUN 10 6 - 20 mg/dL   Creatinine, Ser 4.09 0.44 - 1.00 mg/dL   Calcium 8.7 (L) 8.9 - 10.3 mg/dL   Total Protein 6.2 (L) 6.5 - 8.1 g/dL   Albumin 3.6 3.5 - 5.0 g/dL   AST 15 15 - 41 U/L   ALT 16 0 - 44 U/L   Alkaline Phosphatase 56 38 - 126 U/L   Total Bilirubin 0.6 <1.2 mg/dL   GFR, Estimated >81 >19 mL/min   Anion gap 6 5 - 15  hCG, quantitative, pregnancy     Status: Abnormal   Collection Time: 01/16/23  8:47 PM  Result Value Ref Range   hCG, Beta Chain, Quant, S 1,892 (H) <5 mIU/mL       IMAGING US OB LESS THAN 14 WEEKS WITH OB TRANSVAGINAL  Result Date: 01/16/2023 CLINICAL DATA:  Abdominal pain, pregnant, assigned gestational age of [redacted] weeks, 3 days. EXAM: OBSTETRIC <14 WK [place] AND TRANSVAGINAL OB US TECHNIQUE: Both transabdominal and transvaginal ultrasound examinations were performed for complete evaluation of the gestation as well as the maternal uterus, adnexal regions, and pelvic cul-de-sac. Transvaginal technique was performed to assess early pregnancy. COMPARISON:  None Available. FINDINGS: Intrauterine gestational sac: Visualized Yolk sac:  Not visualized Embryo:  Not visualized Cardiac Activity: Not  applicable MSD: 5 mm   5 w   to d CRL: Not applicable Subchorionic hemorrhage:  None visualized. Maternal uterus/adnexae: The uterus is closed and is unremarkable. No intrauterine masses are seen. The uterus is anteverted. No free fluid seen within the cul-de-sac. The maternal left ovary is unremarkable. The right ovary is not visualized. IMPRESSION: Intrauterine gestational sac identified. Nonvisualization of the yolk sac roughly estimates the gestational between 5.0 and 5.5 weeks. Follow-up sonography is recommended in 10-14 days to document appropriate progression and better age the gestation. Electronically Signed   By: Helyn Numbers M.D.   On: 01/16/2023 23:12    MAU Management/MDM: I have reviewed the triage vital signs and the nursing notes.   Pertinent labs & imaging results that were available during my care of the patient were reviewed by me and considered in my medical decision making (see chart for details).      I have reviewed her medical records including past results, notes and treatments. Medical, Surgical, and family history were reviewed.  Medications and recent lab tests were reviewed  Ordered usual first trimester r/o ectopic labs.   Pelvic cultures done Will check baseline Ultrasound to rule out ectopic.  Discussed seeing yolk sac effectively rules out ectopic pregnancy  We recommend repeat US in 7-10 days  Patient states she has a new OB Rn interview next week;  This bleeding/pain can represent a normal pregnancy with bleeding, spontaneous abortion or even an ectopic which can be life-threatening.  The process as listed above helps to determine which of these is present.    ASSESSMENT Pelvic pain in early pregnancy Pregnancy of unknown location Single intrauterine gestational sac with yolk sac  PLAN Discharge home Plan to repeat Ultrasound in about 7-10 days  Pt stable at time of discharge. Encouraged to return here if she develops worsening of symptoms, increase in  pain, fever,  or other concerning symptoms.    Wynelle Bourgeois CNM, MSN Certified Nurse-Midwife 01/16/2023  11:30 PM

## 2023-01-17 ENCOUNTER — Other Ambulatory Visit: Payer: Self-pay | Admitting: Advanced Practice Midwife

## 2023-01-17 DIAGNOSIS — R102 Pelvic and perineal pain: Secondary | ICD-10-CM

## 2023-01-17 DIAGNOSIS — O26891 Other specified pregnancy related conditions, first trimester: Secondary | ICD-10-CM | POA: Diagnosis not present

## 2023-01-17 DIAGNOSIS — O3680X0 Pregnancy with inconclusive fetal viability, not applicable or unspecified: Secondary | ICD-10-CM | POA: Diagnosis not present

## 2023-01-17 DIAGNOSIS — Z3A01 Less than 8 weeks gestation of pregnancy: Secondary | ICD-10-CM | POA: Diagnosis not present

## 2023-01-17 NOTE — Progress Notes (Signed)
Repeat US followup scheduled

## 2023-01-22 ENCOUNTER — Inpatient Hospital Stay (HOSPITAL_COMMUNITY): Payer: Medicaid Other

## 2023-01-22 ENCOUNTER — Encounter (HOSPITAL_COMMUNITY): Payer: Self-pay | Admitting: Obstetrics and Gynecology

## 2023-01-22 ENCOUNTER — Inpatient Hospital Stay (HOSPITAL_COMMUNITY)
Admission: AD | Admit: 2023-01-22 | Discharge: 2023-01-22 | Disposition: A | Discharge status: Home / Self Care | Payer: Medicaid Other | Attending: Obstetrics and Gynecology | Admitting: Obstetrics and Gynecology

## 2023-01-22 ENCOUNTER — Other Ambulatory Visit: Payer: Self-pay

## 2023-01-22 DIAGNOSIS — R109 Unspecified abdominal pain: Secondary | ICD-10-CM | POA: Insufficient documentation

## 2023-01-22 DIAGNOSIS — O99331 Smoking (tobacco) complicating pregnancy, first trimester: Secondary | ICD-10-CM | POA: Diagnosis not present

## 2023-01-22 DIAGNOSIS — Z3A00 Weeks of gestation of pregnancy not specified: Secondary | ICD-10-CM | POA: Diagnosis not present

## 2023-01-22 DIAGNOSIS — O021 Missed abortion: Secondary | ICD-10-CM

## 2023-01-22 DIAGNOSIS — F17210 Nicotine dependence, cigarettes, uncomplicated: Secondary | ICD-10-CM | POA: Insufficient documentation

## 2023-01-22 DIAGNOSIS — O3680X0 Pregnancy with inconclusive fetal viability, not applicable or unspecified: Secondary | ICD-10-CM | POA: Insufficient documentation

## 2023-01-22 DIAGNOSIS — Z3A08 8 weeks gestation of pregnancy: Secondary | ICD-10-CM | POA: Insufficient documentation

## 2023-01-22 DIAGNOSIS — O26891 Other specified pregnancy related conditions, first trimester: Secondary | ICD-10-CM | POA: Insufficient documentation

## 2023-01-22 DIAGNOSIS — O209 Hemorrhage in early pregnancy, unspecified: Secondary | ICD-10-CM | POA: Insufficient documentation

## 2023-01-22 LAB — URINALYSIS, ROUTINE W REFLEX MICROSCOPIC
Bilirubin Urine: NEGATIVE
Glucose, UA: NEGATIVE mg/dL
Ketones, ur: NEGATIVE mg/dL
Nitrite: NEGATIVE
Protein, ur: 30 mg/dL — AB
Specific Gravity, Urine: 1.032 — ABNORMAL HIGH (ref 1.005–1.030)
pH: 5 (ref 5.0–8.0)

## 2023-01-22 LAB — WET PREP, GENITAL
Sperm: NONE SEEN
Trich, Wet Prep: NONE SEEN
WBC, Wet Prep HPF POC: >=10 — AB (ref <10)
Yeast Wet Prep HPF POC: NONE SEEN

## 2023-01-22 LAB — HCG, QUANTITATIVE, PREGNANCY: hCG, Beta Chain, Quant, S: 121 m[IU]/mL — ABNORMAL HIGH (ref <5)

## 2023-01-22 NOTE — Progress Notes (Signed)
Pt awaiting tests results, RN asked by pt's mother to come bathroom in lobby to evaluate pt's status.  Pt observed in restroom, states her VB has increased, passing clots and she's having abdominal cramping.  RN assessed VB, pt and her mother assured although VB has increased VB isn't excessive.  VB noted to be moderate with small clots.  Pt informed all test results haven't returned and once results received, CNM would speak to her. Pt offered to be placed in room and given pain meds for cramping.  Pt declined, states she wants to "go home".  Pt and mother informed RN will speak to APP regarding status and request for discharge.

## 2023-01-22 NOTE — MAU Provider Note (Cosign Needed Addendum)
History     CSN: [id number]  Arrival date and time: 01/22/23 4098   Event Date/Time   First Provider Initiated Contact with Patient 01/22/23 1334      Chief Complaint  Patient presents with   Cramping    Candace Richardson is a 32 y.o. G3P1102 at [redacted]w[redacted]d by LMP of 11/01/2022.  She presents today for vaginal bleeding and abdominal cramping.  While in the waiting room, after completion of labs and [place], patient reports increased bleeding and passing of clot.  She states the pain improved after passing clot. Patient is tearful and expressing desire "to go home."     OB History     Gravida  3   Para  2   Term  1   Preterm  1   AB      Living  2      SAB      IAB      Ectopic      Multiple  0   Live Births  2           Past Medical History:  Diagnosis Date   Bipolar 1 disorder (HCC)    Depression    Gonorrhea    Polycystic ovary disease    PTSD (post-traumatic stress disorder)    Trichomonas infection     Past Surgical History:  Procedure Laterality Date   EAR EXAMINATION UNDER ANESTHESIA     WISDOM TOOTH EXTRACTION      Family History  Problem Relation Age of Onset   Heart disease Maternal Uncle    Diabetes Maternal Grandmother     Social History   Tobacco Use   Smoking status: Light Smoker    Types: Cigarettes   Smokeless tobacco: Never  Substance Use Topics   Alcohol use: No    Alcohol/week: 0.0 standard drinks of alcohol   Drug use: No    Allergies:  Allergies  Allergen Reactions   Penicillins Hives and Other (See Comments)    Rocephin w/out reaction. Has patient had a PCN reaction causing immediate rash, facial/tongue/throat swelling, SOB or lightheadedness with hypotension: yes Has patient had a PCN reaction causing severe rash involving mucus membranes or skin necrosis: No Has patient had a PCN reaction that required hospitalization No Has patient had a PCN reaction occurring within the last 10 years: No If all of the above  answers are "NO", then may proceed with Cephalosporin use.     No medications prior to admission.    Review of Systems  Unable to perform ROS: Other   Physical Exam   Blood pressure (!) 140/64, pulse 66, temperature 97.7 F (36.5 C), temperature source Oral, resp. rate 18, height 6' (1.829 m), weight 127.8 kg, last menstrual period 11/25/2022, SpO2 99%.  Physical Exam Constitutional:      Appearance: Normal appearance.  HENT:     Head: Normocephalic and atraumatic.  Cardiovascular:     Rate and Rhythm: Normal rate.  Pulmonary:     Effort: Pulmonary effort is normal.  Musculoskeletal:     Cervical back: Normal range of motion.  Neurological:     Mental Status: She is alert and oriented to person, place, and time.  Psychiatric:        Mood and Affect: Mood is depressed. Affect is tearful.     MAU Course  Procedures Results for orders placed or performed during the hospital encounter of 01/22/23 (from the past 24 hour(s))  Urinalysis, Routine w reflex microscopic -Urine, Clean  Catch     Status: Abnormal   Collection Time: 01/22/23  9:41 AM  Result Value Ref Range   Color, Urine YELLOW YELLOW   APPearance HAZY (A) CLEAR   Specific Gravity, Urine 1.032 (H) 1.005 - 1.030   pH 5.0 5.0 - 8.0   Glucose, UA NEGATIVE NEGATIVE mg/dL   Hgb urine dipstick LARGE (A) NEGATIVE   Bilirubin Urine NEGATIVE NEGATIVE   Ketones, ur NEGATIVE NEGATIVE mg/dL   Protein, ur 30 (A) NEGATIVE mg/dL   Nitrite NEGATIVE NEGATIVE   Leukocytes,Ua TRACE (A) NEGATIVE   RBC / HPF 6-10 0 - 5 RBC/hpf   WBC, UA 21-50 0 - 5 WBC/hpf   Bacteria, UA MANY (A) NONE SEEN   Squamous Epithelial / HPF 0-5 0 - 5 /HPF   Mucus PRESENT   hCG, quantitative, pregnancy     Status: Abnormal   Collection Time: 01/22/23 10:20 AM  Result Value Ref Range   hCG, Beta Chain, Quant, S 121 (H) <5 mIU/mL  Wet prep, genital     Status: Abnormal   Collection Time: 01/22/23 10:24 AM   Specimen: PATH Cytology Cervicovaginal  Ancillary Only  Result Value Ref Range   Yeast Wet Prep HPF POC NONE SEEN NONE SEEN   Trich, Wet Prep NONE SEEN NONE SEEN   Clue Cells Wet Prep HPF POC PRESENT (A) NONE SEEN   WBC, Wet Prep HPF POC >=10 (A) <10   Sperm NONE SEEN     MDM Cultures: Wet Prep and GC/CT Labs: UA, hCG Ultrasound Assessment and Plan  32 year old G3P1102 at 8.2 weeks SAB  -Provider to bedside. -Patient tearful and reports desire for discharge. -Informed that [place] results pending, but hCG returned significantly decreased indicative of SAB IP. -Patient immediately gets up from triage chair and states she wants to leave.  -Patient's mother-Frea- with questions regarding clot passed. Informed that clot appeared to be decidual tissue and not fetus. -Will send clot to pathology to confirm. -No follow up care scheduled.  -Will send message to [facility] for scheduling accordingly. -Patient discharged to home.   Cherre Robins 01/22/2023, 1:34 PM    US OB Transvaginal  Result Date: 01/22/2023 CLINICAL DATA:  Vaginal bleeding. EXAM: TRANSVAGINAL OB ULTRASOUND TECHNIQUE: Transvaginal ultrasound was performed for complete evaluation of the gestation as well as the maternal uterus, adnexal regions, and pelvic cul-de-sac. COMPARISON:  None Available. FINDINGS: Intrauterine gestational sac: Single Yolk sac:  Not Visualized. Embryo:  Not Visualized. MSD: 5.1 mm   5 w   2 d Subchorionic hemorrhage:  None visualized. Maternal uterus/adnexae: Bilateral ovaries were not distinctly visualized. IMPRESSION: *Very early gestational sac like area is seen in the fundal endometrium measuring up to 5.1 mm in mean diameter. However, there is no significant interval growth when compared to the prior exam from 01/16/2023. Findings are therefore concerning for failed early pregnancy. Clinical correlation with serial quantitative beta HCG and follow-up ultrasound in 7-14 days is recommended. Electronically Signed   By: Jules Schick M.D.    On: 01/22/2023 14:45    Addendum 3:39 PM  -[place] returns as above.  -Attempted to call patient with no answer.  Generic HIPAA complaint message left encouraging patient to check mychart messages.  -Message sent via mychart regarding results.   Cherre Robins MSN, CNM Advanced Practice Provider, [facility]

## 2023-01-22 NOTE — MAU Note (Signed)
Candace Richardson is a 32 y.o. at [redacted]w[redacted]d here in MAU reporting: she's having pelciv cramping since last night and now is spotting this morning.  Reports took one Tylenol 650 mg at 2200, relief provided for 20 minutes.  Denies recent intercourse. LMP: NA Onset of complaint: last night Pain score: 6 Vitals:   01/22/23 0959  BP: (!) 140/64  Pulse: 66  Resp: 18  Temp: 97.7 F (36.5 C)  SpO2: 99%     FHT:NA Lab orders placed from triage:   UA

## 2023-01-23 ENCOUNTER — Ambulatory Visit: Payer: Medicaid Other | Admitting: *Deleted

## 2023-01-23 VITALS — BP 119/80 | HR 66 | Wt 281.8 lb

## 2023-01-23 DIAGNOSIS — O021 Missed abortion: Secondary | ICD-10-CM | POA: Diagnosis not present

## 2023-01-23 DIAGNOSIS — Z3A01 Less than 8 weeks gestation of pregnancy: Secondary | ICD-10-CM

## 2023-01-23 LAB — GC/CHLAMYDIA PROBE AMP (~~LOC~~) NOT AT ARMC
Chlamydia: NEGATIVE
Comment: NEGATIVE
Comment: NORMAL
Neisseria Gonorrhea: NEGATIVE

## 2023-01-23 NOTE — Progress Notes (Addendum)
Candace Richardson presents today with her mother. MAU visit noted yesterday 01/22/23. Pt was evaluated for pelvic cramping and spotting at [redacted]w[redacted]d. Of note previous US 01/16/23 showed no YS, no FP/embryo and GS b/w 5 and 5.5 wks.  [place] was repeated yesterday morning and resulted later in the day. Following [place] VB and cramping increased. Second US showed no YS, no FP/ embryo and no interval growth of GS. Due to concern for failed pregnancy serial bHCGs and f/u US were planned. Pt left MAU after being seen by provider and receiving results from [facility] but prior to final [place] results. Provider unable to reach pt to review [place] report. Pt reports cont'd VB today without clots or passage of tissue. VB described as similar in volume to regular menses. Vital signs stable. No signs or symptoms of acute blood loss observed or reported. RN offered condolences and emotional support. Pts mother asked questions regarding use of transvaginal US probe and regarding lack of visualization of embryo on [place]. Further explanation provided. Pt requests medicine make the process "quicker". RN discussed her request with Dr. Debroah Loop. He advised to continue plan of care as stated in MAU provider note. Pt will be scheduled for serial bHCGs and [place] f/u in 14 days. Pt  advised to seek care for VB greater than 1 pad per hour, severe pain, fever, or signs of acute blood loss. Pt verbalized understanding and desire to go home. Pt offered [facility] referral but declines at this time. bHCG and [place] appts scheduled at check out. Further condolences offered.

## 2023-01-24 ENCOUNTER — Other Ambulatory Visit: Payer: Medicaid Other

## 2023-01-24 LAB — SURGICAL PATHOLOGY

## 2023-02-03 ENCOUNTER — Other Ambulatory Visit: Payer: Self-pay

## 2023-02-03 ENCOUNTER — Ambulatory Visit (INDEPENDENT_AMBULATORY_CARE_PROVIDER_SITE_OTHER): Payer: Medicaid Other

## 2023-02-03 DIAGNOSIS — Z3A00 Weeks of gestation of pregnancy not specified: Secondary | ICD-10-CM

## 2023-02-03 DIAGNOSIS — O26891 Other specified pregnancy related conditions, first trimester: Secondary | ICD-10-CM | POA: Diagnosis not present

## 2023-02-03 DIAGNOSIS — R102 Pelvic and perineal pain: Secondary | ICD-10-CM | POA: Diagnosis not present

## 2023-02-09 DIAGNOSIS — Z419 Encounter for procedure for purposes other than remedying health state, unspecified: Secondary | ICD-10-CM | POA: Diagnosis not present

## 2023-03-12 DIAGNOSIS — Z419 Encounter for procedure for purposes other than remedying health state, unspecified: Secondary | ICD-10-CM | POA: Diagnosis not present

## 2023-04-10 ENCOUNTER — Other Ambulatory Visit (HOSPITAL_COMMUNITY)
Admission: RE | Admit: 2023-04-10 | Discharge: 2023-04-10 | Disposition: A | Discharge status: Home / Self Care | Payer: Medicaid Other | Source: Ambulatory Visit | Attending: Obstetrics and Gynecology | Admitting: Obstetrics and Gynecology

## 2023-04-10 ENCOUNTER — Ambulatory Visit: Payer: Medicaid Other

## 2023-04-10 VITALS — BP 131/76 | HR 70 | Ht 72.0 in | Wt 274.0 lb

## 2023-04-10 DIAGNOSIS — N898 Other specified noninflammatory disorders of vagina: Secondary | ICD-10-CM

## 2023-04-10 NOTE — Progress Notes (Signed)
SUBJECTIVE:  33 y.o. female complains of copious vaginal discharge for 1 week(s). Denies abnormal vaginal bleeding or significant pelvic pain or fever. No UTI symptoms. Denies history of known exposure to STD.  Patient's last menstrual period was 11/25/2022.  OBJECTIVE:  She appears well, afebrile. Urine dipstick: not done.  ASSESSMENT:  Vaginal Discharge  Vaginal Odor   PLAN:  GC, chlamydia, trichomonas, BVAG, CVAG probe sent to lab. Treatment: To be determined once lab results are received ROV prn if symptoms persist or worsen.

## 2023-04-11 ENCOUNTER — Encounter: Payer: Self-pay | Admitting: Obstetrics and Gynecology

## 2023-04-11 LAB — CERVICOVAGINAL ANCILLARY ONLY
Bacterial Vaginitis (gardnerella): POSITIVE — AB
Candida Glabrata: NEGATIVE
Candida Vaginitis: NEGATIVE
Chlamydia: NEGATIVE
Comment: NEGATIVE
Comment: NEGATIVE
Comment: NEGATIVE
Comment: NEGATIVE
Comment: NEGATIVE
Comment: NORMAL
Neisseria Gonorrhea: NEGATIVE
Trichomonas: NEGATIVE

## 2023-04-11 MED ORDER — METRONIDAZOLE 0.75 % VA GEL: 1.0000 | Freq: Every day | VAGINAL | 0 refills | Status: DC

## 2023-04-11 NOTE — Addendum Note (Signed)
Addended by: Harvie Bridge on: 04/11/2023 04:33 PM   Modules accepted: Orders

## 2023-04-12 DIAGNOSIS — Z419 Encounter for procedure for purposes other than remedying health state, unspecified: Secondary | ICD-10-CM | POA: Diagnosis not present

## 2023-05-02 DIAGNOSIS — H9212 Otorrhea, left ear: Secondary | ICD-10-CM | POA: Diagnosis not present

## 2023-05-02 DIAGNOSIS — Z9622 Myringotomy tube(s) status: Secondary | ICD-10-CM | POA: Diagnosis not present

## 2023-05-06 ENCOUNTER — Ambulatory Visit
Admission: EM | Admit: 2023-05-06 | Discharge: 2023-05-06 | Disposition: A | Discharge status: Home / Self Care | Payer: Medicaid Other | Attending: Family Medicine | Admitting: Family Medicine

## 2023-05-06 ENCOUNTER — Emergency Department (HOSPITAL_COMMUNITY)
Admission: EM | Admit: 2023-05-06 | Discharge: 2023-05-06 | Discharge status: Against Medical Advice | Payer: Medicaid Other | Attending: Emergency Medicine | Admitting: Emergency Medicine

## 2023-05-06 ENCOUNTER — Encounter (HOSPITAL_COMMUNITY): Payer: Self-pay | Admitting: Emergency Medicine

## 2023-05-06 ENCOUNTER — Other Ambulatory Visit: Payer: Self-pay

## 2023-05-06 VITALS — BP 126/80 | HR 69 | Temp 98.0°F | Resp 18 | Ht 72.0 in | Wt 279.0 lb

## 2023-05-06 DIAGNOSIS — R300 Dysuria: Secondary | ICD-10-CM | POA: Insufficient documentation

## 2023-05-06 DIAGNOSIS — R1030 Lower abdominal pain, unspecified: Secondary | ICD-10-CM | POA: Diagnosis not present

## 2023-05-06 DIAGNOSIS — N898 Other specified noninflammatory disorders of vagina: Secondary | ICD-10-CM | POA: Diagnosis present

## 2023-05-06 DIAGNOSIS — R350 Frequency of micturition: Secondary | ICD-10-CM | POA: Diagnosis present

## 2023-05-06 DIAGNOSIS — B9689 Other specified bacterial agents as the cause of diseases classified elsewhere: Secondary | ICD-10-CM | POA: Insufficient documentation

## 2023-05-06 DIAGNOSIS — N760 Acute vaginitis: Secondary | ICD-10-CM | POA: Diagnosis not present

## 2023-05-06 DIAGNOSIS — Z5321 Procedure and treatment not carried out due to patient leaving prior to being seen by health care provider: Secondary | ICD-10-CM | POA: Diagnosis not present

## 2023-05-06 LAB — POCT URINALYSIS DIP (MANUAL ENTRY)
Bilirubin, UA: NEGATIVE
Blood, UA: NEGATIVE
Glucose, UA: NEGATIVE mg/dL
Ketones, POC UA: NEGATIVE mg/dL
Leukocytes, UA: NEGATIVE
Nitrite, UA: NEGATIVE
Protein Ur, POC: 30 mg/dL — AB
Spec Grav, UA: >=1.03 — AB
Urobilinogen, UA: 1 U/dL
pH, UA: 6

## 2023-05-06 LAB — PREGNANCY, URINE: Preg Test, Ur: NEGATIVE

## 2023-05-06 LAB — URINALYSIS, ROUTINE W REFLEX MICROSCOPIC
Bilirubin Urine: NEGATIVE
Glucose, UA: NEGATIVE mg/dL
Hgb urine dipstick: NEGATIVE
Ketones, ur: NEGATIVE mg/dL
Leukocytes,Ua: NEGATIVE
Nitrite: NEGATIVE
Protein, ur: NEGATIVE mg/dL
Specific Gravity, Urine: 1.025 (ref 1.005–1.030)
pH: 5 (ref 5.0–8.0)

## 2023-05-06 NOTE — ED Triage Notes (Signed)
 Patient endorses dysuria and suprapubic pain that started yesterday.
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
## 2023-05-06 NOTE — Discharge Instructions (Addendum)

## 2023-05-06 NOTE — ED Triage Notes (Signed)
 Pt states that she has some lower abdominal pain. Pt states that she also has some discharge.  Abdominal pain. 2-3 days Vaginal discharge x1 day

## 2023-05-07 LAB — CERVICOVAGINAL ANCILLARY ONLY
Bacterial Vaginitis (gardnerella): NEGATIVE
Candida Glabrata: NEGATIVE
Candida Vaginitis: NEGATIVE
Chlamydia: NEGATIVE
Comment: NEGATIVE
Comment: NEGATIVE
Comment: NEGATIVE
Comment: NEGATIVE
Comment: NEGATIVE
Comment: NORMAL
Neisseria Gonorrhea: NEGATIVE
Trichomonas: NEGATIVE

## 2023-05-10 DIAGNOSIS — Z419 Encounter for procedure for purposes other than remedying health state, unspecified: Secondary | ICD-10-CM | POA: Diagnosis not present

## 2023-06-12 DIAGNOSIS — H6993 Unspecified Eustachian tube disorder, bilateral: Secondary | ICD-10-CM | POA: Diagnosis not present

## 2023-06-12 DIAGNOSIS — Z9622 Myringotomy tube(s) status: Secondary | ICD-10-CM | POA: Diagnosis not present

## 2023-06-12 DIAGNOSIS — H9212 Otorrhea, left ear: Secondary | ICD-10-CM | POA: Diagnosis not present

## 2023-06-21 DIAGNOSIS — Z419 Encounter for procedure for purposes other than remedying health state, unspecified: Secondary | ICD-10-CM | POA: Diagnosis not present

## 2023-07-15 DIAGNOSIS — H6993 Unspecified Eustachian tube disorder, bilateral: Secondary | ICD-10-CM | POA: Diagnosis not present

## 2023-07-15 DIAGNOSIS — H7293 Unspecified perforation of tympanic membrane, bilateral: Secondary | ICD-10-CM | POA: Diagnosis not present

## 2023-07-21 DIAGNOSIS — Z419 Encounter for procedure for purposes other than remedying health state, unspecified: Secondary | ICD-10-CM | POA: Diagnosis not present

## 2023-08-21 DIAGNOSIS — Z419 Encounter for procedure for purposes other than remedying health state, unspecified: Secondary | ICD-10-CM | POA: Diagnosis not present

## 2023-08-29 ENCOUNTER — Ambulatory Visit
Admission: RE | Admit: 2023-08-29 | Discharge: 2023-08-29 | Disposition: A | Discharge status: Home / Self Care | Source: Ambulatory Visit | Attending: Internal Medicine | Admitting: Internal Medicine

## 2023-08-29 ENCOUNTER — Inpatient Hospital Stay: Admission: RE | Admit: 2023-08-29 | Payer: Self-pay | Source: Ambulatory Visit

## 2023-08-29 VITALS — BP 122/72 | HR 71 | Temp 97.9°F | Resp 16

## 2023-08-29 DIAGNOSIS — Z9189 Other specified personal risk factors, not elsewhere classified: Secondary | ICD-10-CM | POA: Diagnosis not present

## 2023-08-29 DIAGNOSIS — N898 Other specified noninflammatory disorders of vagina: Secondary | ICD-10-CM | POA: Diagnosis not present

## 2023-08-29 DIAGNOSIS — R110 Nausea: Secondary | ICD-10-CM | POA: Insufficient documentation

## 2023-08-29 LAB — POCT URINALYSIS DIP (MANUAL ENTRY)
Bilirubin, UA: NEGATIVE
Blood, UA: NEGATIVE
Glucose, UA: NEGATIVE mg/dL
Ketones, POC UA: NEGATIVE mg/dL
Leukocytes, UA: NEGATIVE
Nitrite, UA: NEGATIVE
Protein Ur, POC: NEGATIVE mg/dL
Spec Grav, UA: 1.025 (ref 1.010–1.025)
Urobilinogen, UA: 0.2 U/dL
pH, UA: 6.5 (ref 5.0–8.0)

## 2023-08-29 LAB — POCT URINE PREGNANCY: Preg Test, Ur: NEGATIVE

## 2023-08-29 MED ORDER — ONDANSETRON 4 MG PO TBDP
4.0000 mg | ORAL_TABLET | Freq: Once | ORAL | Status: AC
  Administered 2023-08-29: 4 mg via ORAL

## 2023-08-29 MED ORDER — ONDANSETRON 4 MG PO TBDP: 4.0000 mg | ORAL_TABLET | Freq: Three times a day (TID) | ORAL | 0 refills | Status: DC | PRN
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
## 2023-08-29 NOTE — ED Triage Notes (Signed)
 Pt c/o vaginal discharge, odor, and discomfort that began 1 week ago.

## 2023-08-29 NOTE — Discharge Instructions (Signed)

## 2023-08-30 LAB — HIV ANTIBODY (ROUTINE TESTING W REFLEX): HIV Screen 4th Generation wRfx: NONREACTIVE

## 2023-08-30 LAB — RPR: RPR Ser Ql: NONREACTIVE

## 2023-09-01 LAB — CERVICOVAGINAL ANCILLARY ONLY
Bacterial Vaginitis (gardnerella): NEGATIVE
Candida Glabrata: NEGATIVE
Candida Vaginitis: POSITIVE — AB
Chlamydia: NEGATIVE
Comment: NEGATIVE
Comment: NEGATIVE
Comment: NEGATIVE
Comment: NEGATIVE
Comment: NEGATIVE
Comment: NORMAL
Neisseria Gonorrhea: NEGATIVE
Trichomonas: NEGATIVE

## 2023-09-02 ENCOUNTER — Ambulatory Visit (HOSPITAL_COMMUNITY): Payer: Self-pay

## 2023-09-02 MED ORDER — FLUCONAZOLE 150 MG PO TABS: 150.0000 mg | ORAL_TABLET | Freq: Once | ORAL | 0 refills | Status: AC

## 2023-09-15 ENCOUNTER — Ambulatory Visit (INDEPENDENT_AMBULATORY_CARE_PROVIDER_SITE_OTHER): Payer: Self-pay

## 2023-09-15 VITALS — BP 125/78 | HR 67 | Ht 72.0 in | Wt 275.7 lb

## 2023-09-15 DIAGNOSIS — Z3200 Encounter for pregnancy test, result unknown: Secondary | ICD-10-CM

## 2023-09-15 DIAGNOSIS — Z3201 Encounter for pregnancy test, result positive: Secondary | ICD-10-CM

## 2023-09-15 LAB — POCT URINE PREGNANCY: Preg Test, Ur: POSITIVE — AB

## 2023-09-15 MED ORDER — PRENATAL 28-0.8 MG PO TABS: 1.0000 | ORAL_TABLET | Freq: Every day | ORAL | 12 refills | Status: DC

## 2023-09-15 MED ORDER — PROMETHAZINE HCL 25 MG PO TABS: 25.0000 mg | ORAL_TABLET | Freq: Four times a day (QID) | ORAL | 0 refills | Status: DC | PRN

## 2023-09-15 NOTE — Progress Notes (Signed)
 Candace Richardson here for a UPT. Pt had a positive upt at home. LMP is 08/07/23.     UPT in office Positive.    Reviewed medications and informed to start a PNV, if not already. Pt to follow up in 2 weeks for New OB visit. Safe Medications list and commonly asked questions handout provided.

## 2023-09-20 DIAGNOSIS — Z419 Encounter for procedure for purposes other than remedying health state, unspecified: Secondary | ICD-10-CM | POA: Diagnosis not present

## 2023-10-08 ENCOUNTER — Ambulatory Visit (INDEPENDENT_AMBULATORY_CARE_PROVIDER_SITE_OTHER): Payer: Self-pay | Admitting: *Deleted

## 2023-10-08 ENCOUNTER — Other Ambulatory Visit (INDEPENDENT_AMBULATORY_CARE_PROVIDER_SITE_OTHER): Payer: Self-pay

## 2023-10-08 VITALS — BP 136/79 | HR 76 | Wt 270.5 lb

## 2023-10-08 DIAGNOSIS — Z3A01 Less than 8 weeks gestation of pregnancy: Secondary | ICD-10-CM | POA: Diagnosis not present

## 2023-10-08 DIAGNOSIS — O0991 Supervision of high risk pregnancy, unspecified, first trimester: Secondary | ICD-10-CM

## 2023-10-08 DIAGNOSIS — O0990 Supervision of high risk pregnancy, unspecified, unspecified trimester: Secondary | ICD-10-CM

## 2023-10-08 DIAGNOSIS — O3680X0 Pregnancy with inconclusive fetal viability, not applicable or unspecified: Secondary | ICD-10-CM

## 2023-10-08 DIAGNOSIS — Z1331 Encounter for screening for depression: Secondary | ICD-10-CM | POA: Diagnosis not present

## 2023-10-08 DIAGNOSIS — Z3481 Encounter for supervision of other normal pregnancy, first trimester: Secondary | ICD-10-CM | POA: Diagnosis not present

## 2023-10-08 MED ORDER — BLOOD PRESSURE KIT DEVI: 1.0000 | 0 refills | Status: AC

## 2023-10-08 MED ORDER — VITAFOL GUMMIES 3.33-0.333-34.8 MG PO CHEW: 3.0000 | CHEWABLE_TABLET | Freq: Every day | ORAL | 11 refills | Status: AC
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
## 2023-10-08 NOTE — Patient Instructions (Signed)

## 2023-10-09 DIAGNOSIS — O0990 Supervision of high risk pregnancy, unspecified, unspecified trimester: Secondary | ICD-10-CM | POA: Diagnosis not present

## 2023-10-10 LAB — CULTURE, OB URINE

## 2023-10-10 LAB — URINE CULTURE, OB REFLEX

## 2023-10-13 ENCOUNTER — Ambulatory Visit: Payer: Self-pay | Admitting: Obstetrics and Gynecology

## 2023-10-21 DIAGNOSIS — Z419 Encounter for procedure for purposes other than remedying health state, unspecified: Secondary | ICD-10-CM | POA: Diagnosis not present

## 2023-10-28 ENCOUNTER — Ambulatory Visit (INDEPENDENT_AMBULATORY_CARE_PROVIDER_SITE_OTHER): Admitting: Obstetrics and Gynecology

## 2023-10-28 ENCOUNTER — Encounter: Payer: Self-pay | Admitting: Obstetrics and Gynecology

## 2023-10-28 ENCOUNTER — Other Ambulatory Visit (HOSPITAL_COMMUNITY)
Admission: RE | Admit: 2023-10-28 | Discharge: 2023-10-28 | Disposition: A | Discharge status: Home / Self Care | Source: Ambulatory Visit | Attending: Obstetrics and Gynecology | Admitting: Obstetrics and Gynecology

## 2023-10-28 VITALS — BP 123/75 | HR 66 | Wt 278.0 lb

## 2023-10-28 DIAGNOSIS — O09891 Supervision of other high risk pregnancies, first trimester: Secondary | ICD-10-CM | POA: Diagnosis not present

## 2023-10-28 DIAGNOSIS — O0991 Supervision of high risk pregnancy, unspecified, first trimester: Secondary | ICD-10-CM | POA: Diagnosis not present

## 2023-10-28 DIAGNOSIS — Z3182 Encounter for Rh incompatibility status: Secondary | ICD-10-CM | POA: Diagnosis not present

## 2023-10-28 DIAGNOSIS — O09899 Supervision of other high risk pregnancies, unspecified trimester: Secondary | ICD-10-CM | POA: Insufficient documentation

## 2023-10-28 DIAGNOSIS — O99210 Obesity complicating pregnancy, unspecified trimester: Secondary | ICD-10-CM | POA: Insufficient documentation

## 2023-10-28 DIAGNOSIS — O99211 Obesity complicating pregnancy, first trimester: Secondary | ICD-10-CM | POA: Diagnosis not present

## 2023-10-28 DIAGNOSIS — O0990 Supervision of high risk pregnancy, unspecified, unspecified trimester: Secondary | ICD-10-CM

## 2023-10-28 DIAGNOSIS — Z3A10 10 weeks gestation of pregnancy: Secondary | ICD-10-CM | POA: Insufficient documentation

## 2023-10-28 MED ORDER — ASPIRIN 81 MG PO CHEW: 162.0000 mg | CHEWABLE_TABLET | Freq: Every day | ORAL | 3 refills | Status: AC

## 2023-10-28 NOTE — Progress Notes (Signed)
 INITIAL PRENATAL VISIT NOTE  Subjective:  Candace Richardson is a 33 y.o. [id number] at [redacted]w[redacted]d by early ultrasound being seen today for her initial prenatal visit. She has an obstetric history significant for preterm delivery at 30 weeks. She has a medical history significant for PCOS.  Patient reports no complaints.  Contractions: Not present. Vag. Bleeding: None.   . Denies leaking of fluid.    Past Medical History:  Diagnosis Date   Bipolar 1 disorder (HCC)    Depression    Gonorrhea    Hypertension    Polycystic ovary disease    PTSD (post-traumatic stress disorder)    Trichomonas infection     Past Surgical History:  Procedure Laterality Date   EAR EXAMINATION UNDER ANESTHESIA     WISDOM TOOTH EXTRACTION      OB History  Gravida Para Term Preterm AB Living  4 2 1 1 1 2   SAB IAB Ectopic Multiple Live Births  1 0 0 0 2    # Outcome Date GA Lbr Len/2nd Weight Sex Type Anes PTL Lv  4 Current           3 SAB 01/2023     SAB     2 Term 09/16/15 [redacted]w[redacted]d 11:25 / 00:09 6 lb 11.4 oz (3.045 kg) M Vag-Spont None  LIV     Birth Comments: WNL   1 Preterm 10/15/12 [redacted]w[redacted]d 07:45 / 00:10 3 lb (1.361 kg) F Vag-Spont None  LIV    Social History   Socioeconomic History   Marital status: Single    Spouse name: Not on file   Number of children: Not on file   Years of education: Not on file   Highest education level: Not on file  Occupational History   Occupation: Part-time student    Comment: [employer]  Tobacco Use   Smoking status: Former    Types: Cigarettes   Smokeless tobacco: Never  Vaping Use   Vaping status: Never Used  Substance and Sexual Activity   Alcohol use: No    Alcohol/week: 0.0 standard drinks of alcohol   Drug use: No   Sexual activity: Yes    Birth control/protection: None    Comment: Last encounter 2 weeks  Other Topics Concern   Not on file  Social History Narrative   Not on file   Social Drivers of Health   Financial Resource Strain: Not on file  Food  Insecurity: Not on file  Transportation Needs: Not on file  Physical Activity: Not on file  Stress: Not on file  Social Connections: Unknown (03/12/2022)   Received from [employer]   Social Network    Social Network: Not on file    Family History  Problem Relation Age of Onset   Healthy Mother    Hypertension Father    Heart disease Maternal Uncle    Diabetes Maternal Grandmother      Current Outpatient Medications:    aspirin  81 MG chewable tablet, Chew 2 tablets (162 mg total) by mouth daily., Disp: 180 tablet, Rfl: 3   Prenatal Vit-Fe Phos-FA-Omega (VITAFOL  GUMMIES) 3.33-0.333-34.8 MG CHEW, Chew 3 tablets by mouth daily., Disp: 90 tablet, Rfl: 11   Blood Pressure Monitoring (BLOOD PRESSURE KIT) DEVI, 1 Device by Does not apply route once a week., Disp: 1 each, Rfl: 0   promethazine  (PHENERGAN ) 25 MG tablet, Take 1 tablet (25 mg total) by mouth every 6 (six) hours as needed for nausea or vomiting., Disp: 30 tablet, Rfl: 0  Allergies  Allergen Reactions   Penicillins Hives and Other (See Comments)    Rocephin  w/out reaction. Has patient had a PCN reaction causing immediate rash, facial/tongue/throat swelling, SOB or lightheadedness with hypotension: yes Has patient had a PCN reaction causing severe rash involving mucus membranes or skin necrosis: No Has patient had a PCN reaction that required hospitalization No Has patient had a PCN reaction occurring within the last 10 years: No If all of the above answers are NO, then may proceed with Cephalosporin use.     Review of Systems: Negative except for what is mentioned in HPI.  Objective:   Vitals:   10/28/23 0957  BP: 123/75  Pulse: 66  Weight: 278 lb (126.1 kg)    Fetal Status: Fetal Heart Rate (bpm): US          Physical Exam: BP 123/75   Pulse 66   Wt 278 lb (126.1 kg)   LMP 08/07/2023 (Approximate)   BMI 37.70 kg/m  CONSTITUTIONAL: Well-developed, well-nourished female in no acute distress.  NEUROLOGIC:  Alert and oriented to person, place, and time. Normal reflexes, muscle tone coordination. No cranial nerve deficit noted. PSYCHIATRIC: Normal mood and affect. Normal behavior. Normal judgment and thought content. SKIN: Skin is warm and dry. No rash noted. Not diaphoretic. No erythema. No pallor. HENT:  Normocephalic, atraumatic, External right and left ear normal. Oropharynx is clear and moist EYES: Conjunctivae and EOM are normal. Pupils are equal, round, and reactive to light. No scleral icterus.  NECK: Normal range of motion, supple, no masses CARDIOVASCULAR: Normal heart rate noted, regular rhythm RESPIRATORY: Effort and breath sounds normal, no problems with respiration noted BREASTS: deferred ABDOMEN: Soft, nontender, nondistended, gravid. HL:[id number] MUSCULOSKELETAL: Normal range of motion. EXT:  No edema and no tenderness. 2+ distal pulses.   Assessment and Plan:  Pregnancy: [id number] at [redacted]w[redacted]d by early ultrasound  1. Supervision of high risk pregnancy, antepartum (Primary) Continue routine prenatal care Will start baby ASA at 15 weeks for preeclampsia prophylaxis   - CBC/D/Plt+RPR+Rh+ABO+RubIgG... - Cervicovaginal ancillary only( [place]) - HgB A1c - Comp Met (CMET) - PANORAMA PRENATAL TEST - [place] Basic Panel  2. [redacted] weeks gestation of pregnancy  - CBC/D/Plt+RPR+Rh+ABO+RubIgG... - Cervicovaginal ancillary only( [place]) - HgB A1c - Comp Met (CMET) - PANORAMA PRENATAL TEST - [place] Basic Panel  3. Obesity affecting pregnancy in first trimester, unspecified obesity type   4. History of preterm delivery, currently pregnant Will watch for symptoms Cervical length is risk factors increase   Preterm labor symptoms and general obstetric precautions including but not limited to vaginal bleeding, contractions, leaking of fluid and fetal movement were reviewed in detail with the patient.  Please refer to After Visit Summary for other counseling recommendations.    Return in about 4 weeks (around 11/25/2023) for [facility], in person.  Jerilynn DELENA Buddle 10/28/2023 10:38 AM

## 2023-10-28 NOTE — Patient Instructions (Signed)
 Start baby aspirin  2 tablets daily starting at 15 weeks(late September)

## 2023-10-29 LAB — CBC/D/PLT+RPR+RH+ABO+RUBIGG...
Antibody Screen: NEGATIVE
Basophils Absolute: 0.1 x10E3/uL (ref 0.0–0.2)
Basos: 1 %
EOS (ABSOLUTE): 0.3 x10E3/uL (ref 0.0–0.4)
Eos: 3 %
HCV Ab: NONREACTIVE
HIV Screen 4th Generation wRfx: NONREACTIVE
Hematocrit: 43.1 % (ref 34.0–46.6)
Hemoglobin: 14 g/dL (ref 11.1–15.9)
Hepatitis B Surface Ag: NEGATIVE
Immature Grans (Abs): 0.1 x10E3/uL (ref 0.0–0.1)
Immature Granulocytes: 1 %
Lymphocytes Absolute: 2.8 x10E3/uL (ref 0.7–3.1)
Lymphs: 28 %
MCH: 30.3 pg (ref 26.6–33.0)
MCHC: 32.5 g/dL (ref 31.5–35.7)
MCV: 93 fL (ref 79–97)
Monocytes Absolute: 0.5 x10E3/uL (ref 0.1–0.9)
Monocytes: 5 %
Neutrophils Absolute: 6.4 x10E3/uL (ref 1.4–7.0)
Neutrophils: 62 %
Platelets: 188 x10E3/uL (ref 150–450)
RBC: 4.62 x10E6/uL (ref 3.77–5.28)
RDW: 13.9 % (ref 11.7–15.4)
RPR Ser Ql: NONREACTIVE
Rh Factor: POSITIVE
Rubella Antibodies, IGG: 1.58 {index} (ref >0.99)
WBC: 10.1 x10E3/uL (ref 3.4–10.8)

## 2023-10-29 LAB — COMPREHENSIVE METABOLIC PANEL WITH GFR
ALT: 12 IU/L (ref 0–32)
AST: 14 IU/L (ref 0–40)
Albumin: 4.2 g/dL (ref 4.0–5.0)
Alkaline Phosphatase: 54 IU/L (ref 44–121)
BUN/Creatinine Ratio: 7 — ABNORMAL LOW (ref 9–23)
BUN: 5 mg/dL — ABNORMAL LOW (ref 6–20)
Bilirubin Total: 0.5 mg/dL (ref 0.0–1.2)
CO2: 18 mmol/L — ABNORMAL LOW (ref 20–29)
Calcium: 9 mg/dL (ref 8.7–10.2)
Chloride: 104 mmol/L (ref 96–106)
Creatinine, Ser: 0.71 mg/dL (ref 0.57–1.00)
Globulin, Total: 2 g/dL (ref 1.5–4.5)
Glucose: 97 mg/dL (ref 70–99)
Potassium: 4.2 mmol/L (ref 3.5–5.2)
Sodium: 136 mmol/L (ref 134–144)
Total Protein: 6.2 g/dL (ref 6.0–8.5)
eGFR: 117 mL/min/1.73 (ref >59)

## 2023-10-29 LAB — CERVICOVAGINAL ANCILLARY ONLY
Bacterial Vaginitis (gardnerella): NEGATIVE
Candida Glabrata: NEGATIVE
Candida Vaginitis: NEGATIVE
Chlamydia: NEGATIVE
Comment: NEGATIVE
Comment: NEGATIVE
Comment: NEGATIVE
Comment: NEGATIVE
Comment: NEGATIVE
Comment: NORMAL
Neisseria Gonorrhea: NEGATIVE
Trichomonas: NEGATIVE

## 2023-10-29 LAB — HEMOGLOBIN A1C
Est. average glucose Bld gHb Est-mCnc: 111 mg/dL
Hgb A1c MFr Bld: 5.5 % (ref 4.8–5.6)

## 2023-10-29 LAB — HCV INTERPRETATION

## 2023-11-03 ENCOUNTER — Ambulatory Visit: Payer: Self-pay | Admitting: Obstetrics and Gynecology

## 2023-11-03 LAB — PANORAMA PRENATAL TEST FULL PANEL:PANORAMA TEST PLUS 5 ADDITIONAL MICRODELETIONS: FETAL FRACTION: 2.7

## 2023-11-03 LAB — HORIZON CUSTOM: REPORT SUMMARY: NEGATIVE

## 2023-11-13 ENCOUNTER — Other Ambulatory Visit: Payer: Self-pay

## 2023-11-13 ENCOUNTER — Inpatient Hospital Stay (HOSPITAL_COMMUNITY)

## 2023-11-13 ENCOUNTER — Inpatient Hospital Stay (HOSPITAL_COMMUNITY)
Admission: AD | Admit: 2023-11-13 | Discharge: 2023-11-13 | Disposition: A | Discharge status: Home / Self Care | Attending: Obstetrics & Gynecology | Admitting: Obstetrics & Gynecology

## 2023-11-13 DIAGNOSIS — Z3A12 12 weeks gestation of pregnancy: Secondary | ICD-10-CM

## 2023-11-13 DIAGNOSIS — O209 Hemorrhage in early pregnancy, unspecified: Secondary | ICD-10-CM

## 2023-11-13 DIAGNOSIS — Z3492 Encounter for supervision of normal pregnancy, unspecified, second trimester: Secondary | ICD-10-CM

## 2023-11-13 LAB — WET PREP, GENITAL
Clue Cells Wet Prep HPF POC: NONE SEEN
Sperm: NONE SEEN
Trich, Wet Prep: NONE SEEN
WBC, Wet Prep HPF POC: <10 (ref <10)
Yeast Wet Prep HPF POC: NONE SEEN

## 2023-11-13 LAB — CBC
HCT: 37.3 % (ref 36.0–46.0)
Hemoglobin: 12.8 g/dL (ref 12.0–15.0)
MCH: 29.8 pg (ref 26.0–34.0)
MCHC: 34.3 g/dL (ref 30.0–36.0)
MCV: 86.9 fL (ref 80.0–100.0)
Platelets: 179 K/uL (ref 150–400)
RBC: 4.29 MIL/uL (ref 3.87–5.11)
RDW: 14 % (ref 11.5–15.5)
WBC: 10.2 K/uL (ref 4.0–10.5)
nRBC: 0 % (ref 0.0–0.2)

## 2023-11-13 NOTE — MAU Note (Signed)
 Candace Richardson is a 34 y.o. at [redacted]w[redacted]d here in MAU reporting: she woke this morning and was leaking blood, but the VB is now light.  Denies recent intercourse and pain.  LMP: 08/07/2023 Onset of complaint: today Pain score: 0 Vitals:   11/13/23 1143  BP: (!) 138/55  Pulse: 70  Resp: 18  Temp: 98.7 F (37.1 C)  SpO2: 99%     FHT: 155 bpm  Lab orders placed from triage: UA

## 2023-11-13 NOTE — MAU Provider Note (Signed)
 S [name] is a 33 y.o. [phone] patient who presents to MAU today with complaint of stating she is 12 weeks 6 days pregnant reporting she woke up this morning with vaginal bleeding, but reports the bleeding is now light.  She denies any recent intercourse, or abdominal cramping.  She denies any fever, chills, reports no complaints of dysuria, vaginal discharge or  irritation.     O BP (!) 138/55   Pulse 70   Temp 98.7 F (37.1 C) (Oral)   Resp 18   Ht 6' (1.829 m)   Wt 125.2 kg   LMP 08/07/2023 (Approximate)   SpO2 99%   BMI 37.43 kg/m  Physical Exam Vitals and nursing note reviewed.  Constitutional:      General: She is not in acute distress.    Appearance: Normal appearance. She is obese. She is not ill-appearing.  HENT:     Head: Normocephalic.     Nose: Nose normal.     Mouth/Throat:     Mouth: Mucous membranes are moist.  Cardiovascular:     Rate and Rhythm: Normal rate.  Pulmonary:     Effort: Pulmonary effort is normal.  Abdominal:     Palpations: Abdomen is soft.     Tenderness: There is no abdominal tenderness.  Musculoskeletal:        General: Normal range of motion.     Cervical back: Normal range of motion.  Skin:    General: Skin is warm.  Neurological:     Mental Status: She is alert and oriented to person, place, and time.  Psychiatric:        Mood and Affect: Mood normal.        Behavior: Behavior normal.     MDM  HIGH  Vaginal bleeding/ cramping in early pregnacy CBC: Unremarkable ABO: A Positive OB Ultrasound ( c/w IUP @ [redacted]w[redacted]d and no evidence emergent issues at this time) Swabs: wet prep negative, GC pending at  discharge  No evidence of acute pathology at this time. Plan to discharge home with precautions and OB f/u as scheduled.  Differential diagnosis considered for 1st trimester vaginal bleeding includes but is not limited to: ectopic pregnancy, complete spontaneous abortion, incomplete abortion, missed abortion, threatened  abortion, embryonic/fetal demise, cervical insufficiency, cervical or vaginal disorder    Orders Placed This Encounter  Procedures   Wet prep, genital    Standing Status:   Standing    Number of Occurrences:   1   US  OB Comp Less 14 Wks    IUP confirmed on previous US     Standing Status:   Standing    Number of Occurrences:   1    Symptom/Reason for Exam:   Vaginal bleeding in pregnancy, first trimester [[id number]]   CBC    Standing Status:   Standing    Number of Occurrences:   1   Discharge patient Discharge disposition: 01-Home or Self Care; Discharge patient date: 11/13/2023    Standing Status:   Standing    Number of Occurrences:   1    Discharge disposition:   01-Home or Self Care [1]    Discharge patient date:   11/13/2023    Narrative & Impression  CLINICAL DATA:  First trimester pregnancy with vaginal bleeding   EXAM: OBSTETRIC <14 WK ULTRASOUND   TECHNIQUE: Transabdominal ultrasound was performed for evaluation of the gestation as well as the maternal uterus and adnexal regions.   COMPARISON:  10/08/2023   FINDINGS: Intrauterine  gestational sac: Present, single   Yolk sac:  Present   Embryo:  Present   Cardiac Activity: Present   Heart Rate: 155 bpm   CRL:   60.7 mm   13 w 1 d                  US  EDC: 05/19/2024   Subchorionic hemorrhage:  None visualized.   Maternal uterus/adnexae: Unremarkable   IMPRESSION: Single living intrauterine pregnancy measuring 13 weeks 1 day gestation. No emergency complicating feature observed.     Electronically Signed   By: Ryan Salvage M.D.   On: 11/13/2023 14:40      Results for orders placed or performed during the hospital encounter of 11/13/23 (from the past 24 hours)  CBC     Status: None   Collection Time: 11/13/23 11:28 AM  Result Value Ref Range   WBC 10.2 4.0 - 10.5 K/uL   RBC 4.29 3.87 - 5.11 MIL/uL   Hemoglobin 12.8 12.0 - 15.0 g/dL   HCT 62.6 63.9 - 53.9 %   MCV 86.9 80.0 - 100.0 fL   MCH 29.8  26.0 - 34.0 pg   MCHC 34.3 30.0 - 36.0 g/dL   RDW 85.9 88.4 - 84.4 %   Platelets 179 150 - 400 K/uL   nRBC 0.0 0.0 - 0.2 %  Wet prep, genital     Status: None   Collection Time: 11/13/23 11:57 AM   Specimen: PATH Cytology Cervicovaginal Ancillary Only  Result Value Ref Range   Yeast Wet Prep HPF POC NONE SEEN NONE SEEN   Trich, Wet Prep NONE SEEN NONE SEEN   Clue Cells Wet Prep HPF POC NONE SEEN NONE SEEN   WBC, Wet Prep HPF POC <10 <10   Sperm NONE SEEN      I have reviewed the patient chart and performed the physical exam . I have ordered & interpreted the lab results and reviewed and interpreted the ultrasound images and agree with the radiologist findings A/P as described below.  Counseling and education provided and patient agreeable  with plan as described below. Verbalized understanding.    ASSESSMENT Medical screening exam complete  1. Normal intrauterine pregnancy on prenatal ultrasound in second trimester (Primary)  2. Vaginal bleeding affecting early pregnancy  3. [redacted] weeks gestation of pregnancy   PLAN Future Appointments  Date Time Provider Department Center  11/25/2023 10:55 AM Delores Nidia CROME, FNP [facility] None  12/30/2023  8:15 AM [facility] PROVIDER 1 [facility]  12/30/2023  8:30 AM [facility] US5 [facility]    Discharge from MAU in stable condition  See AVS for full description of educational information and instructions provided to the patient at time of discharge  Warning signs for worsening condition that would warrant emergency follow-up discussed Patient may return to MAU as needed   Littie Olam LABOR, NP 11/13/2023 3:26 PM   This chart was dictated using voice recognition software, Dragon. Despite the best efforts of this provider to proofread and correct errors, errors may still occur which can change documentation meaning.

## 2023-11-14 LAB — GC/CHLAMYDIA PROBE AMP (~~LOC~~) NOT AT ARMC
Chlamydia: NEGATIVE
Comment: NEGATIVE
Comment: NORMAL
Neisseria Gonorrhea: NEGATIVE

## 2023-11-21 DIAGNOSIS — Z419 Encounter for procedure for purposes other than remedying health state, unspecified: Secondary | ICD-10-CM | POA: Diagnosis not present

## 2023-11-25 ENCOUNTER — Ambulatory Visit (INDEPENDENT_AMBULATORY_CARE_PROVIDER_SITE_OTHER): Admitting: Obstetrics and Gynecology

## 2023-11-25 VITALS — BP 123/72 | HR 77 | Wt 276.0 lb

## 2023-11-25 DIAGNOSIS — O99211 Obesity complicating pregnancy, first trimester: Secondary | ICD-10-CM | POA: Diagnosis not present

## 2023-11-25 DIAGNOSIS — O09899 Supervision of other high risk pregnancies, unspecified trimester: Secondary | ICD-10-CM | POA: Diagnosis not present

## 2023-11-25 DIAGNOSIS — Z3A14 14 weeks gestation of pregnancy: Secondary | ICD-10-CM | POA: Diagnosis not present

## 2023-11-25 DIAGNOSIS — O0990 Supervision of high risk pregnancy, unspecified, unspecified trimester: Secondary | ICD-10-CM

## 2023-11-25 DIAGNOSIS — N6459 Other signs and symptoms in breast: Secondary | ICD-10-CM | POA: Diagnosis not present

## 2023-11-25 DIAGNOSIS — O4690 Antepartum hemorrhage, unspecified, unspecified trimester: Secondary | ICD-10-CM

## 2023-11-25 NOTE — Progress Notes (Signed)
 Has been taking B/P at home. Noted elevated at times. Highest has been 142/80. Has noted some brown discharge since visit to ER. Denies cramping.
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
## 2023-12-23 ENCOUNTER — Encounter: Payer: Self-pay | Admitting: Obstetrics

## 2023-12-23 ENCOUNTER — Ambulatory Visit: Admitting: Obstetrics

## 2023-12-23 VITALS — BP 131/69 | HR 80 | Wt 276.5 lb

## 2023-12-23 DIAGNOSIS — O99210 Obesity complicating pregnancy, unspecified trimester: Secondary | ICD-10-CM

## 2023-12-23 DIAGNOSIS — O0990 Supervision of high risk pregnancy, unspecified, unspecified trimester: Secondary | ICD-10-CM

## 2023-12-23 DIAGNOSIS — O09899 Supervision of other high risk pregnancies, unspecified trimester: Secondary | ICD-10-CM | POA: Diagnosis not present

## 2023-12-23 DIAGNOSIS — F320 Major depressive disorder, single episode, mild: Secondary | ICD-10-CM | POA: Diagnosis not present

## 2023-12-23 NOTE — Progress Notes (Signed)
 Pt presents for rob. Pt has no questions or concerns at this time.

## 2023-12-23 NOTE — Addendum Note (Signed)
 Addended by: ALVIA ROSINA GAILS on: 12/23/2023 09:16 AM   Modules accepted: Orders

## 2023-12-23 NOTE — Progress Notes (Signed)
 Subjective:  Candace Richardson is a 33 y.o. [id number] at [redacted]w[redacted]d being seen today for ongoing prenatal care.  She is currently monitored for the following issues for this high-risk pregnancy and has Depression; Headache; Conductive hearing loss in left ear; Myringotomy tube status; Supervision of high risk pregnancy, antepartum; Obesity complicating pregnancy; and History of preterm delivery, currently pregnant on their problem list.  Patient reports no complaints.  Contractions: Not present. Vag. Bleeding: None.  Movement: Present. Denies leaking of fluid.   The following portions of the patient's history were reviewed and updated as appropriate: allergies, current medications, past family history, past medical history, past social history, past surgical history and problem list. Problem list updated.  Objective:   Vitals:   12/23/23 0853  BP: 131/69  Pulse: 80  Weight: 276 lb 8 oz (125.4 kg)    Fetal Status: Fetal Heart Rate (bpm): 146   Movement: Present     General:  Alert, oriented and cooperative. Patient is in no acute distress.  Skin: Skin is warm and dry. No rash noted.   Cardiovascular: Normal heart rate noted  Respiratory: Normal respiratory effort, no problems with respiration noted  Abdomen: Soft, gravid, appropriate for gestational age. Pain/Pressure: Absent     Pelvic:  Cervical exam deferred        Extremities: Normal range of motion.  Edema: None  Mental Status: Normal mood and affect. Normal behavior. Normal judgment and thought content.   Urinalysis:      Assessment and Plan:  Pregnancy: [id number] at [redacted]w[redacted]d  1. Supervision of high risk pregnancy, antepartum (Primary) Rx: - AFP, Serum, Open Spina Bifida  2. History of preterm delivery, currently pregnant  3. Current mild episode of major depressive disorder, unspecified whether recurrent  4. Obesity affecting pregnancy, antepartum, unspecified obesity type   Preterm labor symptoms and general obstetric precautions  including but not limited to vaginal bleeding, contractions, leaking of fluid and fetal movement were reviewed in detail with the patient. Please refer to After Visit Summary for other counseling recommendations.   Return in about 4 weeks (around 01/20/2024) for [facility].   Rudy Carlin LABOR, MD 12/23/2023

## 2023-12-25 LAB — AFP, SERUM, OPEN SPINA BIFIDA
AFP MoM: 0.75
AFP Value: 24.8 ng/mL
Gest. Age on Collection Date: 18 wk
Maternal Age At EDD: 31.2 a
OSBR Risk 1 IN: 10000
Test Results:: NEGATIVE
Weight: 276 [lb_av]

## 2023-12-30 ENCOUNTER — Ambulatory Visit: Attending: Obstetrics and Gynecology

## 2023-12-30 ENCOUNTER — Other Ambulatory Visit: Payer: Self-pay | Admitting: *Deleted

## 2023-12-30 ENCOUNTER — Ambulatory Visit: Admitting: Obstetrics

## 2023-12-30 ENCOUNTER — Ambulatory Visit

## 2023-12-30 VITALS — BP 139/61

## 2023-12-30 DIAGNOSIS — Z3482 Encounter for supervision of other normal pregnancy, second trimester: Secondary | ICD-10-CM | POA: Diagnosis present

## 2023-12-30 DIAGNOSIS — O10012 Pre-existing essential hypertension complicating pregnancy, second trimester: Secondary | ICD-10-CM

## 2023-12-30 DIAGNOSIS — Z7982 Long term (current) use of aspirin: Secondary | ICD-10-CM | POA: Insufficient documentation

## 2023-12-30 DIAGNOSIS — Z36 Encounter for antenatal screening for chromosomal anomalies: Secondary | ICD-10-CM | POA: Insufficient documentation

## 2023-12-30 DIAGNOSIS — O285 Abnormal chromosomal and genetic finding on antenatal screening of mother: Secondary | ICD-10-CM | POA: Insufficient documentation

## 2023-12-30 DIAGNOSIS — O321XX0 Maternal care for breech presentation, not applicable or unspecified: Secondary | ICD-10-CM | POA: Diagnosis not present

## 2023-12-30 DIAGNOSIS — O09212 Supervision of pregnancy with history of pre-term labor, second trimester: Secondary | ICD-10-CM | POA: Insufficient documentation

## 2023-12-30 DIAGNOSIS — O99212 Obesity complicating pregnancy, second trimester: Secondary | ICD-10-CM | POA: Insufficient documentation

## 2023-12-30 DIAGNOSIS — O358XX0 Maternal care for other (suspected) fetal abnormality and damage, not applicable or unspecified: Secondary | ICD-10-CM

## 2023-12-30 DIAGNOSIS — E669 Obesity, unspecified: Secondary | ICD-10-CM

## 2023-12-30 DIAGNOSIS — O09292 Supervision of pregnancy with other poor reproductive or obstetric history, second trimester: Secondary | ICD-10-CM | POA: Diagnosis not present

## 2023-12-30 DIAGNOSIS — O0990 Supervision of high risk pregnancy, unspecified, unspecified trimester: Secondary | ICD-10-CM | POA: Diagnosis present

## 2023-12-30 DIAGNOSIS — O99342 Other mental disorders complicating pregnancy, second trimester: Secondary | ICD-10-CM | POA: Insufficient documentation

## 2023-12-30 DIAGNOSIS — Z3A19 19 weeks gestation of pregnancy: Secondary | ICD-10-CM

## 2023-12-30 DIAGNOSIS — O10912 Unspecified pre-existing hypertension complicating pregnancy, second trimester: Secondary | ICD-10-CM

## 2023-12-30 DIAGNOSIS — Z363 Encounter for antenatal screening for malformations: Secondary | ICD-10-CM | POA: Diagnosis not present

## 2023-12-30 DIAGNOSIS — Z364 Encounter for antenatal screening for fetal growth retardation: Secondary | ICD-10-CM | POA: Diagnosis not present

## 2023-12-30 NOTE — Progress Notes (Signed)
 MFM Consult Note  Candace Richardson is currently at 19 weeks and 4 days.  She was seen for a detailed fetal anatomy scan due to maternal obesity with a BMI of 37.7.    The patient reports a history of chronic hypertension that is not treated with any medications.  She is taking daily baby aspirin  for preeclampsia prophylaxis.  She denies any problems in her current pregnancy.   Her blood pressure today was 139/61.  Her cell free DNA test could not provide a risk assessment due to insufficient fetal DNA.  Sonographic findings Single intrauterine pregnancy at 19w 4d  Fetal cardiac activity:  Observed and appears normal. Presentation: Breech. The anatomic structures that were well seen appear normal without evidence of soft markers. Due to poor acoustic windows some structures remain suboptimally visualized. Fetal biometry shows the estimated fetal weight at the 69th percentile.  Amniotic fluid: Within normal limits.  MVP: 4.53 cm. Placenta: Anterior. Adnexa: No abnormality visualized. Cervical length: 4.8 cm.  The views of the fetal anatomy were limited today due to maternal body habitus and the fetal position.  The patient was informed that anomalies may be missed due to technical limitations. If the fetus is in a suboptimal position or maternal habitus is increased, visualization of the fetus in the maternal uterus may be impaired.  Chronic hypertension and maternal obesity The increased risk of superimposed preeclampsia due to her history of chronic hypertension and obesity was discussed.   She was advised to continue taking a daily baby aspirin  for preeclampsia prophylaxis. Due to chronic hypertension and maternal obesity, we will continue to follow her with growth ultrasounds throughout her pregnancy. The patient had her Panorama cell free DNA test redrawn today.  Our genetic counselor will notify her of the results once it is available.  A follow-up exam was scheduled in 5 weeks to  complete the views of the fetal anatomy and to assess the fetal growth.    The patient stated that all of her questions were answered today.  A total of 30 minutes was spent counseling and coordinating the care for this patient.  Greater than 50% of the time was spent in direct face-to-face contact.

## 2024-01-08 ENCOUNTER — Ambulatory Visit: Payer: Self-pay

## 2024-01-08 LAB — PANORAMA PRENATAL TEST FULL PANEL:PANORAMA TEST PLUS 5 ADDITIONAL MICRODELETIONS: FETAL FRACTION: 3.5

## 2024-01-20 ENCOUNTER — Ambulatory Visit (INDEPENDENT_AMBULATORY_CARE_PROVIDER_SITE_OTHER): Admitting: Advanced Practice Midwife

## 2024-01-20 VITALS — BP 123/68 | HR 71 | Wt 276.4 lb

## 2024-01-20 DIAGNOSIS — H539 Unspecified visual disturbance: Secondary | ICD-10-CM | POA: Diagnosis not present

## 2024-01-20 DIAGNOSIS — O0990 Supervision of high risk pregnancy, unspecified, unspecified trimester: Secondary | ICD-10-CM

## 2024-01-20 DIAGNOSIS — O09892 Supervision of other high risk pregnancies, second trimester: Secondary | ICD-10-CM

## 2024-01-20 DIAGNOSIS — O10919 Unspecified pre-existing hypertension complicating pregnancy, unspecified trimester: Secondary | ICD-10-CM | POA: Insufficient documentation

## 2024-01-20 DIAGNOSIS — O0992 Supervision of high risk pregnancy, unspecified, second trimester: Secondary | ICD-10-CM | POA: Diagnosis not present

## 2024-01-20 DIAGNOSIS — O09899 Supervision of other high risk pregnancies, unspecified trimester: Secondary | ICD-10-CM

## 2024-01-20 DIAGNOSIS — O10912 Unspecified pre-existing hypertension complicating pregnancy, second trimester: Secondary | ICD-10-CM

## 2024-01-20 NOTE — Progress Notes (Signed)
 Pt presents for rob. Pt states that she is starting to have problems with her vision, states that it has been happening for about 2 weeks. Pt has no questions or concerns at this time.

## 2024-01-20 NOTE — Progress Notes (Signed)
 PRENATAL VISIT NOTE  Subjective:  Candace Richardson is a 33 y.o. [phone] at [redacted]w[redacted]d being seen today for ongoing prenatal care.  She is currently monitored for the following issues for this low-risk pregnancy and has Depression; Headache; Conductive hearing loss in left ear; Myringotomy tube status; Supervision of high risk pregnancy, antepartum; Obesity complicating pregnancy; History of preterm delivery, currently pregnant; and Chronic hypertension affecting pregnancy on their problem list.  Patient reports episodes of blurry vision intermittently, no scotoma, no h/a.  Contractions: Not present. Vag. Bleeding: None.  Movement: Present. Denies leaking of fluid.   The following portions of the patient's history were reviewed and updated as appropriate: allergies, current medications, past family history, past medical history, past social history, past surgical history and problem list.   Objective:   Vitals:   01/20/24 1047  BP: 123/68  Pulse: 71  Weight: 276 lb 6.4 oz (125.4 kg)    Fetal Status:  Fetal Heart Rate (bpm): 141 Fundal Height: 22 cm Movement: Present    General: Alert, oriented and cooperative. Patient is in no acute distress.  Skin: Skin is warm and dry. No rash noted.   Cardiovascular: Normal heart rate noted  Respiratory: Normal respiratory effort, no problems with respiration noted  Abdomen: Soft, gravid, appropriate for gestational age.  Pain/Pressure: Absent     Pelvic: Cervical exam deferred        Extremities: Normal range of motion.  Edema: Trace (Feet)  Mental Status: Normal mood and affect. Normal behavior. Normal judgment and thought content.      10/08/2023   10:54 AM 10/03/2015    1:25 PM 11/26/2012    3:37 PM  Depression screen PHQ 2/9  Decreased Interest 0 0 0  Down, Depressed, Hopeless 0 0 0  PHQ - 2 Score 0 0 0  Altered sleeping 0 0   Tired, decreased energy 0 0   Change in appetite 0 0   Feeling bad or failure about yourself  0 0   Trouble  concentrating 0 0   Moving slowly or fidgety/restless 0 0   Suicidal thoughts 0 0    PHQ-9 Score 0  0       Data saved with a previous flowsheet row definition        10/08/2023   10:55 AM  GAD 7 : Generalized Anxiety Score  Nervous, Anxious, on Edge 0  Control/stop worrying 0  Worry too much - different things 0  Trouble relaxing 0  Restless 0  Easily annoyed or irritable 1  Afraid - awful might happen 0  Total GAD 7 Score 1    Assessment and Plan:  Pregnancy: [id number] at [redacted]w[redacted]d 1. Supervision of high risk pregnancy, antepartum (Primary) --Anticipatory guidance about next visits/weeks of pregnancy given.   2. History of preterm delivery, currently pregnant --No s/sx of PTL  3. Visual disturbances --Episodic, sometimes occurs after watching TV. Pt has Rx for glasses but they are broken. Pt encouraged to find optometrist that takes her insurance for eye exam.  Let us  know if she has trouble finding provider. --Take BP weekly, notify if elevated  4. Chronic hypertension affecting pregnancy --Normotensive today, no meds --BASA   Preterm labor symptoms and general obstetric precautions including but not limited to vaginal bleeding, contractions, leaking of fluid and fetal movement were reviewed in detail with the patient. Please refer to After Visit Summary for other counseling recommendations.   Return in about 4 weeks (around 02/17/2024) for HROB.  Future Appointments  Date Time Provider Department Center  02/03/2024  7:15 AM [facility] PROVIDER 1 [facility]  02/03/2024  7:30 AM [facility] US1 [facility]  02/17/2024 10:15 AM Erik Kieth BROCKS, MD [facility] None    Olam Boards, CNM

## 2024-01-23 ENCOUNTER — Other Ambulatory Visit: Payer: Self-pay

## 2024-01-23 ENCOUNTER — Inpatient Hospital Stay (HOSPITAL_COMMUNITY)
Admission: AD | Admit: 2024-01-23 | Discharge: 2024-01-23 | Disposition: A | Discharge status: Home / Self Care | Attending: Obstetrics and Gynecology | Admitting: Obstetrics and Gynecology

## 2024-01-23 ENCOUNTER — Encounter (HOSPITAL_COMMUNITY): Payer: Self-pay | Admitting: Obstetrics & Gynecology

## 2024-01-23 DIAGNOSIS — Z3A23 23 weeks gestation of pregnancy: Secondary | ICD-10-CM

## 2024-01-23 DIAGNOSIS — O99212 Obesity complicating pregnancy, second trimester: Secondary | ICD-10-CM | POA: Diagnosis not present

## 2024-01-23 DIAGNOSIS — O09892 Supervision of other high risk pregnancies, second trimester: Secondary | ICD-10-CM | POA: Diagnosis present

## 2024-01-23 DIAGNOSIS — O10012 Pre-existing essential hypertension complicating pregnancy, second trimester: Secondary | ICD-10-CM | POA: Diagnosis not present

## 2024-01-23 DIAGNOSIS — O99210 Obesity complicating pregnancy, unspecified trimester: Secondary | ICD-10-CM

## 2024-01-23 DIAGNOSIS — O10919 Unspecified pre-existing hypertension complicating pregnancy, unspecified trimester: Secondary | ICD-10-CM

## 2024-01-23 LAB — WET PREP, GENITAL
Clue Cells Wet Prep HPF POC: NONE SEEN
Sperm: NONE SEEN
Trich, Wet Prep: NONE SEEN
WBC, Wet Prep HPF POC: >=10 — AB (ref <10)
Yeast Wet Prep HPF POC: NONE SEEN

## 2024-01-23 LAB — URINALYSIS, ROUTINE W REFLEX MICROSCOPIC
Bilirubin Urine: NEGATIVE
Glucose, UA: NEGATIVE mg/dL
Hgb urine dipstick: NEGATIVE
Ketones, ur: NEGATIVE mg/dL
Leukocytes,Ua: NEGATIVE
Nitrite: NEGATIVE
Protein, ur: NEGATIVE mg/dL
Specific Gravity, Urine: 1.021 (ref 1.005–1.030)
pH: 6 (ref 5.0–8.0)

## 2024-01-23 LAB — COMPREHENSIVE METABOLIC PANEL WITH GFR
ALT: 16 U/L (ref 0–44)
AST: 19 U/L (ref 15–41)
Albumin: 3.3 g/dL — ABNORMAL LOW (ref 3.5–5.0)
Alkaline Phosphatase: 49 U/L (ref 38–126)
Anion gap: 9 (ref 5–15)
BUN: <5 mg/dL — ABNORMAL LOW (ref 6–20)
CO2: 21 mmol/L — ABNORMAL LOW (ref 22–32)
Calcium: 8.9 mg/dL (ref 8.9–10.3)
Chloride: 107 mmol/L (ref 98–111)
Creatinine, Ser: 0.63 mg/dL (ref 0.44–1.00)
GFR, Estimated: >60 mL/min (ref >60)
Glucose, Bld: 90 mg/dL (ref 70–99)
Potassium: 4.5 mmol/L (ref 3.5–5.1)
Sodium: 137 mmol/L (ref 135–145)
Total Bilirubin: 0.8 mg/dL (ref 0.0–1.2)
Total Protein: 6.4 g/dL — ABNORMAL LOW (ref 6.5–8.1)

## 2024-01-23 LAB — LIPASE, BLOOD: Lipase: 23 U/L (ref 11–51)

## 2024-01-23 LAB — RESP PANEL BY RT-PCR (RSV, FLU A&B, COVID)  RVPGX2
Influenza A by PCR: NEGATIVE
Influenza B by PCR: NEGATIVE
Resp Syncytial Virus by PCR: NEGATIVE
SARS Coronavirus 2 by RT PCR: NEGATIVE

## 2024-01-23 LAB — PROTEIN / CREATININE RATIO, URINE
Creatinine, Urine: 218 mg/dL
Protein Creatinine Ratio: 0.04 mg/mg{creat} (ref 0.00–0.15)
Total Protein, Urine: 8 mg/dL

## 2024-01-23 MED ORDER — ACETAMINOPHEN 500 MG PO TABS
1000.0000 mg | ORAL_TABLET | Freq: Once | ORAL | Status: AC
  Administered 2024-01-23: 1000 mg via ORAL
  Filled 2024-01-23: qty 2

## 2024-01-23 NOTE — MAU Provider Note (Signed)
 History     CSN: [id number]  Arrival date and time: 01/23/24 1738 First Provider Initiated Contact with Patient   Chief Complaint  Patient presents with   Contractions   Back Pain   excessive fetal movement   Pelvic Pain    pressure    HPI Candace Richardson is a 33 y.o. H5E8887 at 100w0d, 05/21/2024, by Ultrasound, who presents to the Maternity Assessment Unit for cramping, pelvic pressure, and back pain. Overall rated 6-7/10. Symptoms began this afternoon. Back pain is in the lower back and across. She tried a hot shower without any relief, she did not take any medications. She drinks 12oz x4-5/day. Endorses lots of vomiting but abd pain does not feel like muscle soreness.  (+) cramping, pelvic pressure, FM, n/v (-) VB, LOF  Medications Prior to Admission  Medication Sig Dispense Refill Last Dose/Taking   aspirin  81 MG chewable tablet Chew 2 tablets (162 mg total) by mouth daily. 180 tablet 3 01/22/2024   Prenatal Vit-Fe Phos-FA-Omega (VITAFOL  GUMMIES) 3.33-0.333-34.8 MG CHEW Chew 3 tablets by mouth daily. 90 tablet 11 01/22/2024   Blood Pressure Monitoring (BLOOD PRESSURE KIT) DEVI 1 Device by Does not apply route once a week. 1 each 0    promethazine  (PHENERGAN ) 25 MG tablet Take 1 tablet (25 mg total) by mouth every 6 (six) hours as needed for nausea or vomiting. (Patient not taking: Reported on 01/20/2024) 30 tablet 0     Past Medical History:  Diagnosis Date   Bipolar 1 disorder (HCC)    Depression    Eustachian tube dysfunction, bilateral 07/30/2017   Gonorrhea    Hypertension    Otorrhea of left ear 03/29/2022   Polycystic ovary disease    PTSD (post-traumatic stress disorder)    Trichomonas infection     Past Surgical History:  Procedure Laterality Date   EAR EXAMINATION UNDER ANESTHESIA     WISDOM TOOTH EXTRACTION       Allergies:  Allergies  Allergen Reactions   Penicillins Hives and Other (See Comments)    Rocephin  w/out reaction. Has patient had a PCN  reaction causing immediate rash, facial/tongue/throat swelling, SOB or lightheadedness with hypotension: yes Has patient had a PCN reaction causing severe rash involving mucus membranes or skin necrosis: No Has patient had a PCN reaction that required hospitalization No Has patient had a PCN reaction occurring within the last 10 years: No If all of the above answers are NO, then may proceed with Cephalosporin use.     ROS reviewed and pertinent positives and negatives as documented in HPI.    Physical Exam  BP (!) 119/47   Pulse 78   Temp 98.4 F (36.9 C) (Oral)   Resp 16   Ht 6' (1.829 m)   Wt 123.2 kg   LMP 08/07/2023 (Approximate)   SpO2 100%   BMI 36.84 kg/m   Gen: alert, no acute distress CV: regular rate Resp: nonlabored Abd: generalized tenderness without rebound, gravid  Cervical Exam    FHT Baseline: 150 bpm Variability: Good {> 6 bpm) Accelerations: Non-reactive but appropriate for gestational age Decelerations: Variables intermittently Uterine activity: slight irritability  Labs A/Positive/-- (08/19 1031)   Results for orders placed or performed during the hospital encounter of 01/23/24 (from the past 24 hours)  Urinalysis, Routine w reflex microscopic -Urine, Clean Catch     Status: None   Collection Time: 01/23/24  6:42 PM  Result Value Ref Range   Color, Urine YELLOW YELLOW   APPearance CLEAR  CLEAR   Specific Gravity, Urine 1.021 1.005 - 1.030   pH 6.0 5.0 - 8.0   Glucose, UA NEGATIVE NEGATIVE mg/dL   Hgb urine dipstick NEGATIVE NEGATIVE   Bilirubin Urine NEGATIVE NEGATIVE   Ketones, ur NEGATIVE NEGATIVE mg/dL   Protein, ur NEGATIVE NEGATIVE mg/dL   Nitrite NEGATIVE NEGATIVE   Leukocytes,Ua NEGATIVE NEGATIVE  Protein / creatinine ratio, urine     Status: None   Collection Time: 01/23/24  6:42 PM  Result Value Ref Range   Creatinine, Urine 218 mg/dL   Total Protein, Urine 8 mg/dL   Protein Creatinine Ratio 0.04 0.00 - 0.15 mg/mg[Cre]   Comprehensive metabolic panel with GFR     Status: Abnormal   Collection Time: 01/23/24  7:57 PM  Result Value Ref Range   Sodium 137 135 - 145 mmol/L   Potassium 4.5 3.5 - 5.1 mmol/L   Chloride 107 98 - 111 mmol/L   CO2 21 (L) 22 - 32 mmol/L   Glucose, Bld 90 70 - 99 mg/dL   BUN <5 (L) 6 - 20 mg/dL   Creatinine, Ser 9.36 0.44 - 1.00 mg/dL   Calcium 8.9 8.9 - 89.6 mg/dL   Total Protein 6.4 (L) 6.5 - 8.1 g/dL   Albumin 3.3 (L) 3.5 - 5.0 g/dL   AST 19 15 - 41 U/L   ALT 16 0 - 44 U/L   Alkaline Phosphatase 49 38 - 126 U/L   Total Bilirubin 0.8 0.0 - 1.2 mg/dL   GFR, Estimated >39 >39 mL/min   Anion gap 9 5 - 15  Lipase, blood     Status: None   Collection Time: 01/23/24  7:57 PM  Result Value Ref Range   Lipase 23 11 - 51 U/L    Imaging   Assessment and Plan  MDM Candace Richardson is a 33 y.o. H5E8887 at [redacted]w[redacted]d, 05/21/2024, by Ultrasound, who presents to the MAU for back and pelvic pain and bd cramping. Ddx: vaginitis, UTI, round ligament pain, cholecystitis, appendicitis, influenza/URI.  Pain improved with acetaminophen  and labs benign. Patient would like to be discharged. She will check her MyChart for pending swab results.     ICD-10-CM   1. [redacted] weeks gestation of pregnancy  Z3A.23     2. History of preterm delivery, currently pregnant  O09.899     3. Chronic hypertension affecting pregnancy  O10.919     4. Obesity affecting pregnancy, antepartum, unspecified obesity type  O99.210        Pending results at the time of DC: wet prep, COVID/flu/RSV Dispo: DC home in stable condition with return precautions discussed and included in AVS.    Barabara Maier, DO FM-OB Fellow [facility]

## 2024-01-23 NOTE — Discharge Instructions (Signed)
 Safe Medications in Pregnancy  - Take medications as directed on the package. Medications are listed by Brand name, store brands are considered equivalent to brand name, just be sure that the medications are the same. Ex: Tylenol  (acetaminophen ) - If taking multiple medications, please check labels to avoid duplicating the same active ingredients - Do not exceed 4000 mg of Tylenol  (acetaminophen ) in 24 hours - Do not take medications that contain aspirin  or ibuprofen  (Motrin , Advil ) or naproxen  (Aleve , Naprosyn )  Acne Benzoyl Peroxide Salicylic Acid  Allergies Benadryl  (diphenhydramine ) Claritin (loratadine) Flonase nasal spray (fluticasone) Saline nasal spray/drops  Backache/Headache Acetaminophen  (Tylenol ): 2 regular strength (325mg ) every 4 hours OR 2 extra strength (500mg ) every 6 hours  Colds/Coughs Breathe right strips Cepacol throat lozenges OR Chloraseptic throat spray cough drops, alcohol free Delsym (dextromethorphan, cough suppressant) Mucinex (guaifenesin, mucolytic/expectorant) Robitussin DM (dextromethorphan + guaifenesin) Saline nasal spray/drops Sudafed (pseudoephedrine, decongestant)  only after [redacted] weeks gestation and if you do not have high blood pressure Vicks Vaporub Zinc lozenges Zyrtec  (cetirizine )  Constipation Immediate relief Ducolax suppositories (bisacodyl ) Fleet enema (saline enema) glycerin  suppositories milk of magnesia Senokot (overnight) Smooth move tea (overnight)  to keep you regular Colace, Dulcolax (docusate) Metamucil (psyllium fiber) Miralax  (polyethylene glycol)   Diarrhea Kaopectate (bismuth subsalicylate) Imodium A-D (loperamide) *NO pepto Bismol Hemorrhoids Anusol  Anusol HC (Rx only) Preparation H Tucks  Indigestion Tums Maalox Mylanta Pepcid  Insomnia Benadryl  (alcohol free) 25mg  every 6 hours as needed Tylenol  PM Unisom, no Gelcaps  Leg  Cramps Tums MagGel  Nausea/Vomiting Bonine Dramamine Emetrol Ginger extract Sea bands Meclizine (Rx only)  Nausea medication to take during pregnancy Unisom (doxylamine succinate 25 mg tablets) Take one half tablet daily at bedtime. Vitamin B6 100mg  tablets. Take one tablet twice a day (up to 200 mg per day).  Skin Rashes: Aveeno products Benadryl  cream or 25mg  pill every 6 hours as needed Calamine Lotion 1% cortisone cream  Yeast infection: Gyne-lotrimin 7 Monistat 7      PREGNANCY SUPPORT BELT: You are not alone, Seventy-five percent of women have some sort of abdominal or back pain at some point in their pregnancy. Your baby is growing at a fast pace, which means that your whole body is rapidly trying to adjust to the changes. As your uterus grows, your back may start feeling a bit under stress and this can result in back or abdominal pain that can go from mild, and therefore bearable, to severe pains that will not allow you to sit or lay down comfortably, When it comes to dealing with pregnancy-related pains and cramps, some pregnant women usually prefer natural remedies, which the market is filled with nowadays. For example, wearing a pregnancy support belt can help ease and lessen your discomfort and pain.  WHAT ARE THE BENEFITS OF WEARING A PREGNANCY SUPPORT BELT? A pregnancy support belt provides support to the lower portion of the belly taking some of the weight of the growing uterus and distributing to the other parts of your body. It is designed make you comfortable and gives you extra support. Over the years, the pregnancy apparel market has been studying the needs and wants of pregnant women and they have come up with the most comfortable pregnancy support belts that woman could ever ask for. In fact, you will no longer have to wear a stretched-out or bulky pregnancy belt that is visible underneath your clothes and makes you feel even more uncomfortable. Nowadays, a  pregnancy support belt is made of comfortable and stretchy  materials that will not irritate your skin but will actually make you feel at ease and you will not even notice you are wearing it. They are easy to put on and adjust during the day and can be worn at night for additional support.   BENEFITS: Relives Back pain Relieves Abdominal Muscle and Leg Pain Stabilizes the Pelvic Ring Offers a Cushioned Abdominal Lift Pad Relieves pressure on the Sciatic Nerve Within Minutes   Locations that Carry Maternity/Pregnancy Support Belts You can find belts on [employer].com starting at $10-$15. 2.  The [facility] carries belts for $10-$15.        Address/Phone: [address], [address], [place], [place] [address], [phone] 3.  You may be able to file your insurance with [email or web address] and have a belt mailed to you. If you have any problems getting the belt, let your [facility] office know.

## 2024-01-23 NOTE — MAU Note (Signed)
 Candace Richardson is a 33 y.o. at [redacted]w[redacted]d here in MAU reporting: back pain and cramping that started at 1500 today( lower abd and lateral)- and pelvic pressure. Baby has been moving a lot today. She reports +FMs, Denies LOF, VB, regular contractions, new blurry vision, headaches, peripheral edema, or RUQ pain. Denies UTI symptoms  LMP: na Onset of complaint: 1500 Pain score: 6-7/10 Vitals:   01/23/24 1833  BP: (!) 135/58  Pulse: 68  Resp: 16  Temp: 98.4 F (36.9 C)  SpO2: 100%     FHT: 156  Lab orders placed from triage: ua  No tylenol  taken today

## 2024-02-03 ENCOUNTER — Ambulatory Visit: Attending: Obstetrics | Admitting: Maternal & Fetal Medicine

## 2024-02-03 ENCOUNTER — Ambulatory Visit (HOSPITAL_BASED_OUTPATIENT_CLINIC_OR_DEPARTMENT_OTHER)

## 2024-02-03 VITALS — BP 130/61

## 2024-02-03 DIAGNOSIS — O99212 Obesity complicating pregnancy, second trimester: Secondary | ICD-10-CM | POA: Insufficient documentation

## 2024-02-03 DIAGNOSIS — O285 Abnormal chromosomal and genetic finding on antenatal screening of mother: Secondary | ICD-10-CM | POA: Insufficient documentation

## 2024-02-03 DIAGNOSIS — O0990 Supervision of high risk pregnancy, unspecified, unspecified trimester: Secondary | ICD-10-CM

## 2024-02-03 DIAGNOSIS — O09212 Supervision of pregnancy with history of pre-term labor, second trimester: Secondary | ICD-10-CM | POA: Insufficient documentation

## 2024-02-03 DIAGNOSIS — O10912 Unspecified pre-existing hypertension complicating pregnancy, second trimester: Secondary | ICD-10-CM

## 2024-02-03 DIAGNOSIS — Z362 Encounter for other antenatal screening follow-up: Secondary | ICD-10-CM | POA: Insufficient documentation

## 2024-02-03 DIAGNOSIS — Z3689 Encounter for other specified antenatal screening: Secondary | ICD-10-CM | POA: Insufficient documentation

## 2024-02-03 DIAGNOSIS — O10919 Unspecified pre-existing hypertension complicating pregnancy, unspecified trimester: Secondary | ICD-10-CM

## 2024-02-03 DIAGNOSIS — O10012 Pre-existing essential hypertension complicating pregnancy, second trimester: Secondary | ICD-10-CM | POA: Insufficient documentation

## 2024-02-03 DIAGNOSIS — Z3A24 24 weeks gestation of pregnancy: Secondary | ICD-10-CM | POA: Insufficient documentation

## 2024-02-03 DIAGNOSIS — E669 Obesity, unspecified: Secondary | ICD-10-CM | POA: Diagnosis not present

## 2024-02-03 DIAGNOSIS — O358XX0 Maternal care for other (suspected) fetal abnormality and damage, not applicable or unspecified: Secondary | ICD-10-CM

## 2024-02-03 NOTE — Progress Notes (Signed)
   Patient information  Patient Name: Candace Richardson  Patient MRN:   [id number]  Referring practice: MFM Referring Provider: [facility]  Problem List   Patient Active Problem List   Diagnosis Date Noted   Chronic hypertension affecting pregnancy 01/20/2024   Obesity complicating pregnancy 10/28/2023   History of preterm delivery, currently pregnant 10/28/2023   Supervision of high risk pregnancy, antepartum 10/08/2023   Myringotomy tube status 07/07/2020   Conductive hearing loss in left ear 07/30/2017   Depression 10/10/2011   Headache 10/10/2011   Maternal Fetal medicine Consult  BRINNLEY LACAP is a 33 y.o. H5E8887 at [redacted]w[redacted]d here for ultrasound and consultation. Jannifer D Wollard is doing well today with no acute concerns. Today we focused on the following:   The patient is here for a growth ultrasound  to chronic hypertension.  Her blood pressure is normal today.  She is compliant with aspirin  for preeclampsia prophylaxis.  I discussed the ultrasound findings with the patient showing the estimated fetal weight around the 87th percentile with normal amniotic fluid.  Given the upper limit of normal growth I reminded the patient the importance of her glucose challenge test.  The patient had time to ask questions that were answered to her satisfaction.  She verbalized understanding and agrees to proceed with the plan below.  Recommendations -Serial growth ultrasounds every 4-6 weeks until delivery -Delivery around [redacted] weeks gestation  I spent 20 minutes reviewing the patients chart, including labs and images as well as counseling the patient about her medical conditions. Greater than 50% of the time was spent in direct face-to-face patient counseling.  Delora Smaller  MFM, [place]   02/03/2024  8:37 AM   Review of Systems: A review of systems was performed and was negative except per HPI   Vitals and Physical Exam    02/03/2024    7:28 AM 01/23/2024    9:31 PM  01/23/2024    9:16 PM  Vitals with BMI  Systolic 130 137 880  Diastolic 61 71 47  Pulse  70 78    Sitting comfortably on the sonogram table Nonlabored breathing Normal rate and rhythm Abdomen is nontender  Past pregnancies OB History  Gravida Para Term Preterm AB Living  4 2 1 1 1 2   SAB IAB Ectopic Multiple Live Births  1 0 0 0 2    # Outcome Date GA Lbr Len/2nd Weight Sex Type Anes PTL Lv  4 Current           3 SAB 01/2023     SAB     2 Term 09/16/15 [redacted]w[redacted]d 11:25 / 00:09 6 lb 11.4 oz (3.045 kg) M Vag-Spont None  LIV     Birth Comments: WNL   1 Preterm 10/15/12 [redacted]w[redacted]d 07:45 / 00:10 3 lb (1.361 kg) F Vag-Spont None  LIV     Future Appointments  Date Time Provider Department Center  02/17/2024 10:15 AM Erik Kieth BROCKS, MD [facility] None

## 2024-02-13 ENCOUNTER — Encounter: Payer: Self-pay | Admitting: Advanced Practice Midwife

## 2024-02-17 ENCOUNTER — Ambulatory Visit: Admitting: Obstetrics and Gynecology

## 2024-02-17 VITALS — BP 127/73 | HR 78 | Wt 280.0 lb

## 2024-02-17 DIAGNOSIS — O0990 Supervision of high risk pregnancy, unspecified, unspecified trimester: Secondary | ICD-10-CM

## 2024-02-17 DIAGNOSIS — O09899 Supervision of other high risk pregnancies, unspecified trimester: Secondary | ICD-10-CM

## 2024-02-17 DIAGNOSIS — Z6837 Body mass index (BMI) 37.0-37.9, adult: Secondary | ICD-10-CM

## 2024-02-17 DIAGNOSIS — F32A Depression, unspecified: Secondary | ICD-10-CM

## 2024-02-17 DIAGNOSIS — Z23 Encounter for immunization: Secondary | ICD-10-CM

## 2024-02-17 DIAGNOSIS — Z3A26 26 weeks gestation of pregnancy: Secondary | ICD-10-CM

## 2024-02-17 DIAGNOSIS — O10919 Unspecified pre-existing hypertension complicating pregnancy, unspecified trimester: Secondary | ICD-10-CM

## 2024-02-17 NOTE — Addendum Note (Signed)
 Addended by: JERRYE AREA D on: 02/17/2024 10:49 AM   Modules accepted: Orders

## 2024-02-17 NOTE — Progress Notes (Signed)
   PRENATAL VISIT NOTE  Subjective:  Candace Richardson is a 33 y.o. [phone] at [redacted]w[redacted]d being seen today for ongoing prenatal care.  She is currently monitored for the following issues for this high-risk pregnancy and has Depression; Headache; Conductive hearing loss in left ear; Myringotomy tube status; Supervision of high risk pregnancy, antepartum; Obesity complicating pregnancy; History of preterm delivery, currently pregnant; and Chronic hypertension affecting pregnancy on their problem list.  Patient reports no complaints.  Contractions: Irritability. Vag. Bleeding: None.  Movement: Present. Denies leaking of fluid.   The following portions of the patient's history were reviewed and updated as appropriate: allergies, current medications, past family history, past medical history, past social history, past surgical history and problem list.   Objective:   Vitals:   02/17/24 1010  BP: 127/73  Pulse: 78  Weight: 280 lb (127 kg)    Fetal Status: Fetal Heart Rate (bpm): 145   Movement: Present     General:  Alert, oriented and cooperative. Patient is in no acute distress.  Skin: Skin is warm and dry. No rash noted.   Cardiovascular: Normal heart rate noted  Respiratory: Normal respiratory effort, no problems with respiration noted  Abdomen: Soft, gravid, appropriate for gestational age.  Pain/Pressure: Absent      Assessment and Plan:  Pregnancy: H5E8887 at [redacted]w[redacted]d 1. Supervision of high risk pregnancy, antepartum (Primary) 2. [redacted] weeks gestation of pregnancy Flu shot today Discussed 2h GTT, CBC, HIV/RPR & tdap next visit w/ need for fasting  3. History of preterm delivery, currently pregnant G1 PTB 30w > G2 term Normal CL on anatomy  4. Chronic hypertension affecting pregnancy Normotensive, no meds ldASA Normal baseline labs Serial growth US , next scheduled 12/30  5. BMI 37.0-37.9, adult ldASA  6. Depression, unspecified depression type, history of bipolar 1 Discussed risk  for postpartum depression. No current meds or counselor. Reports mood swings and is amenable to [facility] referral to establish care in preparation for postpartum - [facility] referral placed  Please refer to After Visit Summary for other counseling recommendations.   Return in about 2 weeks (around 03/02/2024) for return OB at 28 weeks with 2h GTT, CBC/HIV/RPR & tdap.  Future Appointments  Date Time Provider Department Center  03/09/2024 10:15 AM [facility] PROVIDER 1 [facility]  03/09/2024 10:30 AM [facility] US6 [facility]   Kieth JAYSON Carolin, MD

## 2024-03-09 ENCOUNTER — Ambulatory Visit: Admitting: Obstetrics and Gynecology

## 2024-03-09 ENCOUNTER — Other Ambulatory Visit: Payer: Self-pay | Admitting: *Deleted

## 2024-03-09 ENCOUNTER — Ambulatory Visit

## 2024-03-09 VITALS — BP 126/57

## 2024-03-09 DIAGNOSIS — O285 Abnormal chromosomal and genetic finding on antenatal screening of mother: Secondary | ICD-10-CM | POA: Diagnosis not present

## 2024-03-09 DIAGNOSIS — Z3A29 29 weeks gestation of pregnancy: Secondary | ICD-10-CM

## 2024-03-09 DIAGNOSIS — O10919 Unspecified pre-existing hypertension complicating pregnancy, unspecified trimester: Secondary | ICD-10-CM | POA: Insufficient documentation

## 2024-03-09 DIAGNOSIS — O0990 Supervision of high risk pregnancy, unspecified, unspecified trimester: Secondary | ICD-10-CM | POA: Insufficient documentation

## 2024-03-09 DIAGNOSIS — O10913 Unspecified pre-existing hypertension complicating pregnancy, third trimester: Secondary | ICD-10-CM

## 2024-03-09 DIAGNOSIS — O10013 Pre-existing essential hypertension complicating pregnancy, third trimester: Secondary | ICD-10-CM | POA: Diagnosis not present

## 2024-03-09 DIAGNOSIS — E669 Obesity, unspecified: Secondary | ICD-10-CM | POA: Diagnosis not present

## 2024-03-09 DIAGNOSIS — O99213 Obesity complicating pregnancy, third trimester: Secondary | ICD-10-CM

## 2024-03-10 ENCOUNTER — Ambulatory Visit: Payer: Self-pay | Admitting: Physician Assistant

## 2024-03-10 ENCOUNTER — Other Ambulatory Visit

## 2024-03-10 ENCOUNTER — Encounter: Payer: Self-pay | Admitting: Physician Assistant

## 2024-03-10 VITALS — BP 132/69 | HR 93 | Wt 266.8 lb

## 2024-03-10 DIAGNOSIS — F32A Depression, unspecified: Secondary | ICD-10-CM

## 2024-03-10 DIAGNOSIS — O10913 Unspecified pre-existing hypertension complicating pregnancy, third trimester: Secondary | ICD-10-CM | POA: Diagnosis not present

## 2024-03-10 DIAGNOSIS — Z3A29 29 weeks gestation of pregnancy: Secondary | ICD-10-CM

## 2024-03-10 DIAGNOSIS — O0990 Supervision of high risk pregnancy, unspecified, unspecified trimester: Secondary | ICD-10-CM

## 2024-03-10 DIAGNOSIS — O09893 Supervision of other high risk pregnancies, third trimester: Secondary | ICD-10-CM

## 2024-03-10 DIAGNOSIS — O09899 Supervision of other high risk pregnancies, unspecified trimester: Secondary | ICD-10-CM

## 2024-03-10 DIAGNOSIS — O99343 Other mental disorders complicating pregnancy, third trimester: Secondary | ICD-10-CM

## 2024-03-10 DIAGNOSIS — Z6837 Body mass index (BMI) 37.0-37.9, adult: Secondary | ICD-10-CM

## 2024-03-10 DIAGNOSIS — O0993 Supervision of high risk pregnancy, unspecified, third trimester: Secondary | ICD-10-CM

## 2024-03-10 DIAGNOSIS — O10919 Unspecified pre-existing hypertension complicating pregnancy, unspecified trimester: Secondary | ICD-10-CM

## 2024-03-10 NOTE — Progress Notes (Signed)
 Pt presents for ROB visit. Pt has concerns about weight loss in pregnancy.  Requesting referral for [facility] counseling

## 2024-03-10 NOTE — Progress Notes (Signed)
 After review, MFM consult with provider is not indicated for today  Arna Ranks, MD 03/10/2024 5:19 PM  [facility]

## 2024-03-10 NOTE — Progress Notes (Signed)
 "  PRENATAL VISIT NOTE  Subjective:  Candace Richardson is a 34 y.o. [id number] at [redacted]w[redacted]d being seen today for ongoing prenatal care.  She is currently monitored for the following issues for this high-risk pregnancy and has Depression; Headache; Conductive hearing loss in left ear; Myringotomy tube status; Supervision of high risk pregnancy, antepartum; Obesity complicating pregnancy; History of preterm delivery, currently pregnant; and Chronic hypertension affecting pregnancy on their problem list.  Patient reports concern for losing weight d/t loss of appetite and nausea. She is no longer taking medication for this. Would like to establish with BH, as she had PPD last pregnancy.   Contractions: Irritability. Vag. Bleeding: None.  Movement: Present. Denies leaking of fluid.   The following portions of the patient's history were reviewed and updated as appropriate: allergies, current medications, past family history, past medical history, past social history, past surgical history and problem list.   Objective:   Vitals:   03/10/24 1055  BP: 132/69  Pulse: 93  Weight: 266 lb 12.8 oz (121 kg)    Fetal Status:  Fetal Heart Rate (bpm): 144 Fundal Height: 29 cm Movement: Present    General: Alert, oriented and cooperative. Patient is in no acute distress.  Skin: Skin is warm and dry. No rash noted.   Cardiovascular: Normal heart rate noted  Respiratory: Normal respiratory effort, no problems with respiration noted  Abdomen: Soft, gravid, appropriate for gestational age.  Pain/Pressure: Absent     Pelvic: Cervical exam deferred        Extremities: Normal range of motion.  Edema: Trace  Mental Status: Normal mood and affect. Normal behavior. Normal judgment and thought content.      03/10/2024   10:59 AM 10/08/2023   10:54 AM 10/03/2015    1:25 PM  Depression screen PHQ 2/9  Decreased Interest 2 0 0  Down, Depressed, Hopeless 0 0 0  PHQ - 2 Score 2 0 0  Altered sleeping 2 0 0  Tired,  decreased energy 2 0 0  Change in appetite 0 0 0  Feeling bad or failure about yourself  0 0 0  Trouble concentrating 0 0 0  Moving slowly or fidgety/restless 0 0 0  Suicidal thoughts 0 0 0   PHQ-9 Score 6 0  0      Data saved with a previous flowsheet row definition        03/10/2024   11:01 AM 10/08/2023   10:55 AM  GAD 7 : Generalized Anxiety Score  Nervous, Anxious, on Edge 0 0  Control/stop worrying 0 0  Worry too much - different things 2 0  Trouble relaxing 1 0  Restless 0 0  Easily annoyed or irritable 1 1  Afraid - awful might happen 0 0  Total GAD 7 Score 4 1    Assessment and Plan:  Pregnancy: [id number] at [redacted]w[redacted]d  1. Supervision of high risk pregnancy, antepartum (Primary) Patient doing well, feeling regular fetal movement BP, FHR, FH appropriate  2. [redacted] weeks gestation of pregnancy Anticipatory guidance about next visits/weeks of pregnancy given.  28 week labs today  3. Chronic hypertension affecting pregnancy Normotensive on no medications  Continue ASA  4. Depression, unspecified depression type Patient requests to see BH due to PPD with last. Referral placed.   5. BMI 37.0-37.9, adult Continue ASA  6. History of preterm delivery, currently pregnant No ssxs PTL  Preterm labor symptoms and general obstetric precautions including but not limited to vaginal bleeding, contractions, leaking of  fluid and fetal movement were reviewed in detail with the patient.  Please refer to After Visit Summary for other counseling recommendations.   Return in about 2 weeks (around 03/24/2024) for [facility].  Future Appointments  Date Time Provider Department Center  04/12/2024 10:15 AM [facility] PROVIDER 1 [facility]  04/12/2024 10:30 AM [facility] US2 [facility]    Jorene FORBES Moats, PA-C  "

## 2024-03-11 LAB — CBC
Hematocrit: 38.3 % (ref 34.0–46.6)
Hemoglobin: 13 g/dL (ref 11.1–15.9)
MCH: 31.1 pg (ref 26.6–33.0)
MCHC: 33.9 g/dL (ref 31.5–35.7)
MCV: 92 fL (ref 79–97)
Platelets: 135 x10E3/uL — ABNORMAL LOW (ref 150–450)
RBC: 4.18 x10E6/uL (ref 3.77–5.28)
RDW: 13.6 % (ref 11.7–15.4)
WBC: 10.4 x10E3/uL (ref 3.4–10.8)

## 2024-03-11 LAB — SYPHILIS: RPR W/REFLEX TO RPR TITER AND TREPONEMAL ANTIBODIES, TRADITIONAL SCREENING AND DIAGNOSIS ALGORITHM: RPR Ser Ql: NONREACTIVE

## 2024-03-11 LAB — GLUCOSE TOLERANCE, 2 HOURS W/ 1HR
Glucose, 1 hour: 113 mg/dL (ref 70–179)
Glucose, 2 hour: 99 mg/dL (ref 70–152)
Glucose, Fasting: 88 mg/dL (ref 70–91)

## 2024-03-11 LAB — HIV ANTIBODY (ROUTINE TESTING W REFLEX): HIV Screen 4th Generation wRfx: NONREACTIVE

## 2024-03-22 ENCOUNTER — Ambulatory Visit: Payer: Self-pay | Admitting: Licensed Clinical Social Worker

## 2024-03-22 DIAGNOSIS — F32A Depression, unspecified: Secondary | ICD-10-CM | POA: Diagnosis not present

## 2024-03-22 DIAGNOSIS — O99343 Other mental disorders complicating pregnancy, third trimester: Secondary | ICD-10-CM

## 2024-03-22 NOTE — BH Specialist Note (Signed)
 "   Integrated Behavioral Health via Telemedicine Visit  03/28/2024 Candace Richardson [id number]  Number of Integrated Behavioral Health Clinician visits: 1- Initial Visit  Session Start time: 1115   Session End time: 1150  Total time in minutes: 35    Referring Provider: Jorene Moats Patient/Family location: Home [facility] Provider location: Remote Office All persons participating in visit: Patient and [facility] Types of Service: Individual psychotherapy and Video visit  I connected with Raizy D Hirsch and/or Delainie D Mullinax's patient via  Telephone or Video Enabled Telemedicine Application  (Video is Caregility application) and verified that I am speaking with the correct person using two identifiers. Discussed confidentiality: Yes   I discussed the limitations of telemedicine and the availability of in person appointments.  Discussed there is a possibility of technology failure and discussed alternative modes of communication if that failure occurs.  I discussed that engaging in this telemedicine visit, they consent to the provision of behavioral healthcare and the services will be billed under their insurance.  Patient and/or legal guardian expressed understanding and consented to Telemedicine visit: Yes   Presenting Concerns: Patient and/or family reports the following symptoms/concerns: increased depression and anxiety Duration of problem: Mood; Severity of problem: moderate  Patient and/or Family's Strengths/Protective Factors: Social and Emotional competence, Concrete supports in place (healthy food, safe environments, etc.), and Physical Health (exercise, healthy diet, medication compliance, etc.)  Goals Addressed: Patient will:  Reduce symptoms of: anxiety and depression   Increase knowledge and/or ability of: coping skills, healthy habits, and self-management skills   Demonstrate ability to: Increase healthy adjustment to current life circumstances and Increase adequate support  systems for patient/family  Progress towards Goals: Ongoing    Interventions: Interventions utilized:  Mindfulness or Management Consultant, Supportive Counseling, Psychoeducation and/or Health Education, Communication Skills, and Supportive Reflection Standardized Assessments completed: Not Needed    Patient and/or Family Response: The patient was present for todays session. She reports being the mother of an 6 year old daughter and an 32-year-old son and shared that she experienced a miscarriage in November 2024. She is currently pregnant and expecting a baby boy in March. The patient reports a history of significant postpartum depression following her first pregnancy, characterized by depressive symptoms and emotional detachment, occurring in the context of relationship stress and a premature birth. She reports her second pregnancy and postpartum period were positive, with no symptoms of postpartum depression or anxiety. The patient endorses a mental health history of PTSD, depression, anxiety, and bipolar disorder and reports she is not currently taking psychiatric medication, noting prior use of sertraline was ineffective and exacerbated symptoms. She expressed a desire to be proactive in preparing for the postpartum period and is interested in utilizing non-pharmacological interventions. The patient reports strong family support, including living with her mother, and feels comfortable communicating her needs. She plans to develop a meal plan, create a postpartum checklist, and access educational resources related to the fourth trimester to support her emotional well-being during the postpartum period.    Clinical Assessment/Diagnosis  Perinatal depression in third trimester    Assessment: Patient currently experiencing anticipatory concerns related to the postpartum period due to a prior history of postpartum depression and underlying mental health conditions. She is motivated to  proactively prepare for emotional stability during the postpartum period using non-medication-based supports..   Patient may benefit from continued support of integrated behavioral health services.  Plan: Follow up with behavioral health clinician on : 03/30/2024 Behavioral recommendations: Continued therapeutic support to monitor mood  and  symptoms during pregnancy and postpartum, development of a structured postpartum plan, and engagement of family supports. Psychoeducation on postpartum mood disorders, use of non-pharmacological coping strategies, and early identification of warning signs are recommended to promote emotional stability during the fourth trimester. Referral(s): Integrated [employer] (In Clinic)  I discussed the assessment and treatment plan with the patient and/or parent/guardian. They were provided an opportunity to ask questions and all were answered. They agreed with the plan and demonstrated an understanding of the instructions.   They were advised to call back or seek an in-person evaluation if the symptoms worsen or if the condition fails to improve as anticipated.  Aleyssa Pike LITTIE Seats, LCSWA "

## 2024-03-25 ENCOUNTER — Encounter: Payer: Self-pay | Admitting: Obstetrics and Gynecology

## 2024-03-25 ENCOUNTER — Ambulatory Visit: Payer: Self-pay | Admitting: Obstetrics and Gynecology

## 2024-03-25 VITALS — BP 131/68 | HR 72 | Wt 269.2 lb

## 2024-03-25 DIAGNOSIS — Z23 Encounter for immunization: Secondary | ICD-10-CM | POA: Diagnosis not present

## 2024-03-25 DIAGNOSIS — O0990 Supervision of high risk pregnancy, unspecified, unspecified trimester: Secondary | ICD-10-CM

## 2024-03-25 DIAGNOSIS — O09893 Supervision of other high risk pregnancies, third trimester: Secondary | ICD-10-CM | POA: Diagnosis not present

## 2024-03-25 DIAGNOSIS — O10913 Unspecified pre-existing hypertension complicating pregnancy, third trimester: Secondary | ICD-10-CM | POA: Diagnosis not present

## 2024-03-25 DIAGNOSIS — O99213 Obesity complicating pregnancy, third trimester: Secondary | ICD-10-CM

## 2024-03-25 DIAGNOSIS — O0993 Supervision of high risk pregnancy, unspecified, third trimester: Secondary | ICD-10-CM

## 2024-03-25 DIAGNOSIS — F32A Depression, unspecified: Secondary | ICD-10-CM | POA: Diagnosis not present

## 2024-03-25 DIAGNOSIS — Z3A31 31 weeks gestation of pregnancy: Secondary | ICD-10-CM | POA: Diagnosis not present

## 2024-03-25 DIAGNOSIS — H9012 Conductive hearing loss, unilateral, left ear, with unrestricted hearing on the contralateral side: Secondary | ICD-10-CM

## 2024-03-25 DIAGNOSIS — O09899 Supervision of other high risk pregnancies, unspecified trimester: Secondary | ICD-10-CM

## 2024-03-25 DIAGNOSIS — O10919 Unspecified pre-existing hypertension complicating pregnancy, unspecified trimester: Secondary | ICD-10-CM

## 2024-03-25 DIAGNOSIS — O99211 Obesity complicating pregnancy, first trimester: Secondary | ICD-10-CM

## 2024-03-25 NOTE — Addendum Note (Signed)
 Addended by: Myrtie Leuthold V on: 03/25/2024 11:16 AM   Modules accepted: Orders

## 2024-03-25 NOTE — Progress Notes (Signed)
 Pt presents for ROB visit. No concerns

## 2024-03-25 NOTE — Progress Notes (Signed)
 "  PRENATAL VISIT NOTE  Subjective:  Candace Richardson is a 34 y.o. [id number] at [redacted]w[redacted]d being seen today for ongoing prenatal care.  She is currently monitored for the following issues for this high-risk pregnancy and has Depression; Headache; Conductive hearing loss in left ear; Myringotomy tube status; Supervision of high risk pregnancy, antepartum; Obesity complicating pregnancy; History of preterm delivery, currently pregnant; and Chronic hypertension affecting pregnancy on their problem list.  Patient reports occasional contractions.  Contractions: Irritability. Vag. Bleeding: None.  Movement: Present. Denies leaking of fluid.   The following portions of the patient's history were reviewed and updated as appropriate: allergies, current medications, past family history, past medical history, past social history, past surgical history and problem list.   Objective:   Vitals:   03/25/24 0845  BP: 131/68  Pulse: 72  Weight: 269 lb 3.2 oz (122.1 kg)   Fetal Status:  Fetal Heart Rate (bpm): 141   Movement: Present    General: Alert, oriented and cooperative. Patient is in no acute distress.  Skin: Skin is warm and dry. No rash noted.   Cardiovascular: Normal heart rate noted  Respiratory: Normal respiratory effort, no problems with respiration noted  Abdomen: Soft, gravid, appropriate for gestational age.  Pain/Pressure: Present     Pelvic: Cervical exam deferred        Extremities: Normal range of motion.  Edema: Trace  Mental Status: Normal mood and affect. Normal behavior. Normal judgment and thought content.      03/10/2024   10:59 AM 10/08/2023   10:54 AM 10/03/2015    1:25 PM  Depression screen PHQ 2/9  Decreased Interest 2 0 0  Down, Depressed, Hopeless 0 0 0  PHQ - 2 Score 2 0 0  Altered sleeping 2 0 0  Tired, decreased energy 2 0 0  Change in appetite 0 0 0  Feeling bad or failure about yourself  0 0 0  Trouble concentrating 0 0 0  Moving slowly or fidgety/restless 0 0 0   Suicidal thoughts 0 0 0   PHQ-9 Score 6 0  0      Data saved with a previous flowsheet row definition        03/10/2024   11:01 AM 10/08/2023   10:55 AM  GAD 7 : Generalized Anxiety Score  Nervous, Anxious, on Edge 0 0  Control/stop worrying 0 0  Worry too much - different things 2 0  Trouble relaxing 1 0  Restless 0 0  Easily annoyed or irritable 1 1  Afraid - awful might happen 0 0  Total GAD 7 Score 4 1    Assessment and Plan:  Pregnancy: [id number] at [redacted]w[redacted]d  1. Chronic hypertension affecting pregnancy (Primary) Normotensive, no meds IdASA  2. Conductive hearing loss of left ear with unrestricted hearing of right ear  3. History of preterm delivery, currently pregnant G1 PTB 30w > G2 term Normal CL on anatomy  4. Obesity affecting pregnancy in first trimester, unspecified obesity type  5. Supervision of high risk pregnancy, antepartum Counseled regarding risks/benefits of Tdap vaccine, patient accepts vaccine.   6. Depression, unspecified depression type Has seen BH, feels like is doing good  7. [redacted] weeks gestation of pregnancy   Preterm labor symptoms and general obstetric precautions including but not limited to vaginal bleeding, contractions, leaking of fluid and fetal movement were reviewed in detail with the patient. Please refer to After Visit Summary for other counseling recommendations.   Return in about 2 weeks (  around 04/08/2024) for high OB.  Future Appointments  Date Time Provider Department Center  03/30/2024 10:15 AM Candace Richardson [facility] None  04/12/2024 10:15 AM [facility] PROVIDER 1 [facility]  04/12/2024 10:30 AM [facility] US2 [facility]    Burnard CHRISTELLA Moats, MD  "

## 2024-03-30 ENCOUNTER — Encounter: Payer: Self-pay | Admitting: Licensed Clinical Social Worker

## 2024-03-31 ENCOUNTER — Ambulatory Visit: Payer: Self-pay | Admitting: Physician Assistant

## 2024-04-08 ENCOUNTER — Encounter: Payer: Self-pay | Admitting: Obstetrics and Gynecology

## 2024-04-08 ENCOUNTER — Ambulatory Visit (INDEPENDENT_AMBULATORY_CARE_PROVIDER_SITE_OTHER): Payer: Self-pay | Admitting: Obstetrics and Gynecology

## 2024-04-08 VITALS — BP 121/74 | HR 82 | Wt 265.0 lb

## 2024-04-08 DIAGNOSIS — O09893 Supervision of other high risk pregnancies, third trimester: Secondary | ICD-10-CM | POA: Diagnosis not present

## 2024-04-08 DIAGNOSIS — O0993 Supervision of high risk pregnancy, unspecified, third trimester: Secondary | ICD-10-CM

## 2024-04-08 DIAGNOSIS — O99213 Obesity complicating pregnancy, third trimester: Secondary | ICD-10-CM | POA: Diagnosis not present

## 2024-04-08 DIAGNOSIS — O0990 Supervision of high risk pregnancy, unspecified, unspecified trimester: Secondary | ICD-10-CM

## 2024-04-08 DIAGNOSIS — O10913 Unspecified pre-existing hypertension complicating pregnancy, third trimester: Secondary | ICD-10-CM | POA: Diagnosis not present

## 2024-04-08 DIAGNOSIS — O10919 Unspecified pre-existing hypertension complicating pregnancy, unspecified trimester: Secondary | ICD-10-CM

## 2024-04-08 DIAGNOSIS — O09899 Supervision of other high risk pregnancies, unspecified trimester: Secondary | ICD-10-CM

## 2024-04-08 DIAGNOSIS — F32A Depression, unspecified: Secondary | ICD-10-CM | POA: Diagnosis not present

## 2024-04-08 NOTE — Progress Notes (Signed)
 "  PRENATAL VISIT NOTE  Subjective:  Candace Richardson is a 34 y.o. [id number] at [redacted]w[redacted]d being seen today for ongoing prenatal care.  She is currently monitored for the following issues for this high-risk pregnancy and has Depression; Headache; Conductive hearing loss in left ear; Myringotomy tube status; Supervision of high risk pregnancy, antepartum; Obesity complicating pregnancy; History of preterm delivery, currently pregnant; and Chronic hypertension affecting pregnancy on their problem list.  Patient reports no complaints.  Contractions: Not present. Vag. Bleeding: None.  Movement: Present. Denies leaking of fluid.   The following portions of the patient's history were reviewed and updated as appropriate: allergies, current medications, past family history, past medical history, past social history, past surgical history and problem list.   Objective:   Vitals:   04/08/24 1357  BP: 121/74  Pulse: 82  Weight: 265 lb (120.2 kg)    Fetal Status:  Fetal Heart Rate (bpm): 153 Fundal Height: 34 cm Movement: Present    General: Alert, oriented and cooperative. Patient is in no acute distress.  Skin: Skin is warm and dry. No rash noted.   Cardiovascular: Normal heart rate noted  Respiratory: Normal respiratory effort, no problems with respiration noted  Abdomen: Soft, gravid, appropriate for gestational age.  Pain/Pressure: Present     Pelvic: Cervical exam deferred        Extremities: Normal range of motion.  Edema: Trace  Mental Status: Normal mood and affect. Normal behavior. Normal judgment and thought content.      03/10/2024   10:59 AM 10/08/2023   10:54 AM 10/03/2015    1:25 PM  Depression screen PHQ 2/9  Decreased Interest 2 0 0  Down, Depressed, Hopeless 0 0 0  PHQ - 2 Score 2 0 0  Altered sleeping 2 0 0  Tired, decreased energy 2 0 0  Change in appetite 0 0 0  Feeling bad or failure about yourself  0 0 0  Trouble concentrating 0 0 0  Moving slowly or fidgety/restless 0 0  0  Suicidal thoughts 0 0 0   PHQ-9 Score 6 0  0      Data saved with a previous flowsheet row definition        03/10/2024   11:01 AM 10/08/2023   10:55 AM  GAD 7 : Generalized Anxiety Score  Nervous, Anxious, on Edge 0  0   Control/stop worrying 0  0   Worry too much - different things 2  0   Trouble relaxing 1  0   Restless 0  0   Easily annoyed or irritable 1  1   Afraid - awful might happen 0  0   Total GAD 7 Score 4 1     Data saved with a previous flowsheet row definition    Assessment and Plan:  Pregnancy: [id number] at [redacted]w[redacted]d 1. Supervision of high risk pregnancy, antepartum (Primary) Patient is doing well Discussed sleep aids and hygiene in the third trimester Patient undecided on contraception  2. Chronic hypertension affecting pregnancy Stable without medications Follow up growth ultrasound 2/2  3. History of preterm delivery, currently pregnant   4. Depression, unspecified depression type Stable without medication Followed by [facility]  5. Obesity affecting pregnancy in third trimester, unspecified obesity type Continue ASA  Preterm labor symptoms and general obstetric precautions including but not limited to vaginal bleeding, contractions, leaking of fluid and fetal movement were reviewed in detail with the patient. Please refer to After Visit Summary for other counseling recommendations.  Return in about 2 weeks (around 04/22/2024) for in person, ROB, High risk.  Future Appointments  Date Time Provider Department Center  04/12/2024 10:15 AM [facility] PROVIDER 1 [facility]  04/12/2024 10:30 AM [facility]2 [facility]    Winton Felt, MD  "

## 2024-04-12 ENCOUNTER — Ambulatory Visit

## 2024-04-12 ENCOUNTER — Other Ambulatory Visit

## 2024-04-19 ENCOUNTER — Ambulatory Visit

## 2024-04-23 ENCOUNTER — Encounter: Payer: Self-pay | Admitting: Obstetrics and Gynecology
